# Patient Record
Sex: Female | Born: 1985
Health system: Southern US, Community
[De-identification: ages and names within clinical notes are randomized; demographics above are authoritative.]

## PROBLEM LIST (undated history)

## (undated) DIAGNOSIS — R638 Other symptoms and signs concerning food and fluid intake: Secondary | ICD-10-CM

## (undated) DIAGNOSIS — T8859XA Other complications of anesthesia, initial encounter: Secondary | ICD-10-CM

## (undated) DIAGNOSIS — N920 Excessive and frequent menstruation with regular cycle: Secondary | ICD-10-CM

## (undated) DIAGNOSIS — R32 Unspecified urinary incontinence: Secondary | ICD-10-CM

## (undated) DIAGNOSIS — F419 Anxiety disorder, unspecified: Secondary | ICD-10-CM

## (undated) DIAGNOSIS — Z720 Tobacco use: Secondary | ICD-10-CM

## (undated) DIAGNOSIS — F319 Bipolar disorder, unspecified: Secondary | ICD-10-CM

## (undated) DIAGNOSIS — T4145XA Adverse effect of unspecified anesthetic, initial encounter: Secondary | ICD-10-CM

## (undated) DIAGNOSIS — F32A Depression, unspecified: Secondary | ICD-10-CM

## (undated) DIAGNOSIS — F329 Major depressive disorder, single episode, unspecified: Secondary | ICD-10-CM

## (undated) DIAGNOSIS — F4310 Post-traumatic stress disorder, unspecified: Secondary | ICD-10-CM

## (undated) DIAGNOSIS — K30 Functional dyspepsia: Secondary | ICD-10-CM

## (undated) HISTORY — DX: Anxiety disorder, unspecified: F41.9

## (undated) HISTORY — DX: Tobacco use: Z72.0

## (undated) HISTORY — DX: Other symptoms and signs concerning food and fluid intake: R63.8

## (undated) HISTORY — DX: Post-traumatic stress disorder, unspecified: F43.10

## (undated) HISTORY — DX: Excessive and frequent menstruation with regular cycle: N92.0

## (undated) HISTORY — DX: Bipolar disorder, unspecified: F31.9

## (undated) HISTORY — DX: Depression, unspecified: F32.A

## (undated) HISTORY — DX: Major depressive disorder, single episode, unspecified: F32.9

---

## 2008-09-27 ENCOUNTER — Emergency Department: Payer: Self-pay | Admitting: Emergency Medicine

## 2008-11-21 ENCOUNTER — Observation Stay: Payer: Self-pay | Admitting: Obstetrics and Gynecology

## 2008-11-23 ENCOUNTER — Observation Stay: Payer: Self-pay | Admitting: Obstetrics and Gynecology

## 2008-12-13 ENCOUNTER — Observation Stay: Payer: Self-pay | Admitting: Obstetrics and Gynecology

## 2008-12-14 ENCOUNTER — Ambulatory Visit: Payer: Self-pay | Admitting: Obstetrics and Gynecology

## 2008-12-15 ENCOUNTER — Observation Stay: Payer: Self-pay | Admitting: Obstetrics and Gynecology

## 2009-01-10 ENCOUNTER — Observation Stay: Payer: Self-pay | Admitting: Obstetrics and Gynecology

## 2009-01-18 ENCOUNTER — Observation Stay: Payer: Self-pay | Admitting: Obstetrics and Gynecology

## 2009-01-21 ENCOUNTER — Observation Stay: Payer: Self-pay | Admitting: Obstetrics and Gynecology

## 2009-01-25 ENCOUNTER — Inpatient Hospital Stay: Payer: Self-pay | Admitting: Obstetrics and Gynecology

## 2009-06-29 HISTORY — PX: TUBAL LIGATION: SHX77

## 2010-08-27 ENCOUNTER — Ambulatory Visit: Payer: Self-pay | Admitting: Obstetrics and Gynecology

## 2010-08-29 ENCOUNTER — Ambulatory Visit: Payer: Self-pay | Admitting: Obstetrics and Gynecology

## 2011-07-28 ENCOUNTER — Emergency Department: Payer: Self-pay | Admitting: Emergency Medicine

## 2011-10-27 ENCOUNTER — Emergency Department: Payer: Self-pay | Admitting: *Deleted

## 2011-10-27 LAB — PREGNANCY, URINE: Pregnancy Test, Urine: NEGATIVE m[IU]/mL

## 2011-10-27 LAB — URINALYSIS, COMPLETE
Bacteria: NONE SEEN
Bilirubin,UR: NEGATIVE
Glucose,UR: NEGATIVE mg/dL (ref 0–75)
Ketone: NEGATIVE
Leukocyte Esterase: NEGATIVE
Ph: 6 (ref 4.5–8.0)
Protein: NEGATIVE
Specific Gravity: 1.028 (ref 1.003–1.030)
Squamous Epithelial: 8

## 2012-03-22 ENCOUNTER — Inpatient Hospital Stay: Payer: Self-pay | Admitting: Psychiatry

## 2012-03-23 LAB — BEHAVIORAL MEDICINE 1 PANEL
Alkaline Phosphatase: 68 U/L (ref 50–136)
BUN: 11 mg/dL (ref 7–18)
Basophil #: 0 10*3/uL (ref 0.0–0.1)
Basophil %: 0.6 %
Bilirubin,Total: 0.6 mg/dL (ref 0.2–1.0)
Chloride: 105 mmol/L (ref 98–107)
Co2: 26 mmol/L (ref 21–32)
Creatinine: 0.74 mg/dL (ref 0.60–1.30)
EGFR (African American): >60
EGFR (Non-African Amer.): >60
MCH: 31 pg (ref 26.0–34.0)
MCHC: 34.1 g/dL (ref 32.0–36.0)
Neutrophil #: 1.7 10*3/uL (ref 1.4–6.5)
Neutrophil %: 38.8 %
Osmolality: 276 (ref 275–301)
Platelet: 227 10*3/uL (ref 150–440)
SGPT (ALT): 22 U/L (ref 12–78)
Sodium: 139 mmol/L (ref 136–145)
Total Protein: 7.1 g/dL (ref 6.4–8.2)
WBC: 4.3 10*3/uL (ref 3.6–11.0)

## 2012-03-23 LAB — URINALYSIS, COMPLETE
Bilirubin,UR: NEGATIVE
Glucose,UR: NEGATIVE mg/dL (ref 0–75)
Nitrite: NEGATIVE
RBC,UR: 326 /HPF (ref 0–5)
Squamous Epithelial: 5
WBC UR: 12 /HPF (ref 0–5)

## 2012-06-16 ENCOUNTER — Emergency Department: Payer: Self-pay | Admitting: Internal Medicine

## 2013-04-24 ENCOUNTER — Emergency Department: Payer: Self-pay

## 2013-04-24 LAB — GC/CHLAMYDIA PROBE AMP

## 2013-04-24 LAB — COMPREHENSIVE METABOLIC PANEL
Albumin: 3.7 g/dL (ref 3.4–5.0)
Alkaline Phosphatase: 96 U/L (ref 50–136)
Anion Gap: 5 — ABNORMAL LOW (ref 7–16)
Chloride: 106 mmol/L (ref 98–107)
Co2: 27 mmol/L (ref 21–32)
EGFR (Non-African Amer.): >60
Glucose: 94 mg/dL (ref 65–99)
Osmolality: 273 (ref 275–301)
SGOT(AST): 26 U/L (ref 15–37)
SGPT (ALT): 27 U/L (ref 12–78)
Total Protein: 7.6 g/dL (ref 6.4–8.2)

## 2013-04-24 LAB — URINALYSIS, COMPLETE
Bacteria: NONE SEEN
Glucose,UR: NEGATIVE mg/dL (ref 0–75)
Ketone: NEGATIVE
Nitrite: NEGATIVE
Ph: 8 (ref 4.5–8.0)
Protein: NEGATIVE
RBC,UR: 18 /HPF (ref 0–5)

## 2013-04-24 LAB — CBC
HCT: 40.8 % (ref 35.0–47.0)
MCH: 31 pg (ref 26.0–34.0)
MCHC: 34.5 g/dL (ref 32.0–36.0)
RBC: 4.55 10*6/uL (ref 3.80–5.20)
RDW: 13.6 % (ref 11.5–14.5)

## 2013-04-24 LAB — WET PREP, GENITAL

## 2013-04-24 LAB — LIPASE, BLOOD: Lipase: 114 U/L (ref 73–393)

## 2014-06-05 LAB — HM PAP SMEAR: HM Pap smear: NEGATIVE

## 2014-10-16 NOTE — H&P (Signed)
PATIENT NAME:  Laura [name], Laura [name] MR#:  [id number] DATE OF BIRTH:  [birth year]  DATE OF ADMISSION:  03/22/2012  REFERRING PHYSICIAN: Marlowe Alt, MD ATTENDING PHYSICIAN: Orson Slick, M.D.   IDENTIFYING DATA: Laura [name] is a 29 year old female with history of depression and anxiety.   CHIEF COMPLAINT: "I have to go home."   HISTORY OF PRESENT ILLNESS: Laura [name] has a history of depression and anxiety. In the past she did take SSRIs for it but never liked the effect and did not stay on them long enough.  She was able to deal with depression and anxiety up until recently when financial troubles for her family made her very, very anxious. She is the sole provider for here family of 3 children and a husband who is disabled. She has been receiving food stamps for the family, $700 a month.  She lost food stamps recently.  She is hardly able to pay her bills, is worried about losing her house, and there is no food on the table.  The patient became increasingly anxious with multiple panic attacks. She went to her primary provider who gave her Xanax and Prozac and earlier Celexa but she was unable to tolerate medications. Moreover, Celexa made her suicidal. She is incapacitated by anxiety. Frequently she has to leave her office and go to her car to conceal her panic attacks. She has not been able to sleep. Her thoughts are racing. She has no appetite and lost some weight. She feels guilty, worthless, hopeless. She cries all the time. She has poor energy and concentration. There are problems with memory. There is social isolation, anhedonia, lack of interest. The patient denies thoughts of hurting herself or others. She denies alcohol or illicit drug use. Denies prescription pill abuse. She denies psychotic symptoms or symptoms suggestive of bipolar mania.   PAST PSYCHIATRIC HISTORY: As above, on several occasions she was started on an SSRI but never liked it and never felt it was working for her.  Sometimes she felt it was making her feel worse, strange; and Celexa gave her suicidal ideations. She denies any suicide attempts. No psychiatric hospitalizations.   FAMILY PSYCHIATRIC HISTORY: Mother with anxiety.   PAST MEDICAL HISTORY: None.   ALLERGIES: Amoxicillin.   MEDICATIONS ON ADMISSION: None.   SOCIAL HISTORY: She is married. Her husband suffers Crohn disease and is in the process of getting his disability. She has 3 small children, all of them at the age of 67 currently.  There are twins and another boy. 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 She has been there for 3 months and just past the 96-month threshold. She is very afraid that she will lose her job if she is continues to miss work either due to panic attacks or hospitalization. Dr. Starr Sinclair office did call her employer to let them know that she is in the hospital.    REVIEW OF SYSTEMS: CONSTITUTIONAL: No fevers or chills. No weight changes. EYES: No double or blurred vision. ENT: No hearing loss. RESPIRATORY: No shortness of breath or cough. CARDIOVASCULAR: No chest pain or orthopnea. GASTROINTESTINAL: No abdominal pain, nausea, vomiting, or diarrhea. GU: No incontinence or frequency. ENDOCRINE: No heat or cold intolerance. LYMPHATIC: No anemia or easy bruising. INTEGUMENTARY: No acne or rash. MUSCULOSKELETAL: No muscle or joint pain. NEUROLOGIC: No tingling or weakness. PSYCHIATRIC: See History of Present Illness for details.   PHYSICAL EXAMINATION:  VITAL SIGNS: Blood pressure 114/76, pulse 80, respirations 18, temperature 98.9.  GENERAL: This is a well-developed female in no acute distress.   HEENT: The pupils are equal, round, and reactive to light. Sclerae anicteric.   NECK: Supple. No thyromegaly.   LUNGS: Clear to auscultation. No dullness to percussion.   HEART: Regular rhythm and rate. No murmurs, rubs, or gallops.   ABDOMEN: Soft, nontender, nondistended. Positive bowel sounds.    MUSCULOSKELETAL: Normal muscle strength in all extremities.   SKIN: No rashes or bruises.   LYMPHATIC: No cervical adenopathy.   NEUROLOGIC: Cranial nerves II through XII are intact.   LABORATORY DATA: Chemistries are within normal limits. LFTs within normal limits. TSH 1.25. CBC within normal limits. Urinalysis is not suggestive of urinary tract infection.   MENTAL STATUS EXAMINATION ON ADMISSION: The patient is alert and oriented to person, place, time, and situation. She is pleasant, polite, and cooperative. She is well groomed and casually dressed. She is tearful through most part of the interview. She maintains adequate eye contact. Her speech is soft. Mood is depressed with anxious and tearful affect. Thought processing is logical and goal oriented. Thought content: She denies suicidal or homicidal ideation. There are no delusions or paranoia. There are no auditory or visual hallucinations. Her cognition is grossly intact. Her insight and judgment are fair.   SUICIDE RISK ASSESSMENT ON ADMISSION: This is a patient with a history of depression and anxiety who became more anxious with frequent panic attacks in the context of recent economic problems.   DIAGNOSES:  AXIS I: Major depressive disorder, recurrent, severe. Panic disorder with agoraphobia.  AXIS II: Deferred.  AXIS III: None.  AXIS IV: Mental illness, financial, occupational.  AXIS V: Global assessment of function on admission 35.   PLAN: The patient was admitted to [facility] for safety, stabilization, and medication management. She was initially placed on suicide precautions and was closely monitored for any unsafe behaviors. She underwent full psychiatric and risk assessment. She received pharmacotherapy, individual and group psychotherapy, substance abuse counseling, and support from therapeutic milieu.           1. Mood and anxiety. The patient did not tolerate SSRIs  well in the past. We will start Remeron at night for mood, anxiety, sleep, and appetite. The patient will have available trazodone if need be. 2. Disposition: She will be discharged to home with her husband hopefully shortly.    ____________________________ Wardell Honour. Bary Leriche, MD jbp:vtd D: 03/23/2012 13:20:47 ET T: 03/23/2012 13:40:44 ET JOB#: [id number]  cc: Jolette Lana B. Bary Leriche, MD, <Dictator> Clovis Fredrickson MD ELECTRONICALLY SIGNED 03/25/2012 1:57

## 2015-03-13 ENCOUNTER — Ambulatory Visit (INDEPENDENT_AMBULATORY_CARE_PROVIDER_SITE_OTHER): Payer: 59 | Admitting: Physician Assistant

## 2015-03-13 ENCOUNTER — Encounter: Payer: Self-pay | Admitting: Physician Assistant

## 2015-03-13 ENCOUNTER — Other Ambulatory Visit: Payer: Self-pay

## 2015-03-13 VITALS — BP 116/70 | HR 80 | Temp 98.3°F | Resp 16 | Wt 203.0 lb

## 2015-03-13 DIAGNOSIS — F419 Anxiety disorder, unspecified: Secondary | ICD-10-CM | POA: Insufficient documentation

## 2015-03-13 DIAGNOSIS — E162 Hypoglycemia, unspecified: Secondary | ICD-10-CM | POA: Diagnosis not present

## 2015-03-13 DIAGNOSIS — F329 Major depressive disorder, single episode, unspecified: Secondary | ICD-10-CM | POA: Diagnosis not present

## 2015-03-13 DIAGNOSIS — F4310 Post-traumatic stress disorder, unspecified: Secondary | ICD-10-CM | POA: Insufficient documentation

## 2015-03-13 DIAGNOSIS — F32A Depression, unspecified: Secondary | ICD-10-CM | POA: Insufficient documentation

## 2015-03-13 MED ORDER — ALPRAZOLAM 1 MG PO TABS: 1.0000 mg | ORAL_TABLET | Freq: Two times a day (BID) | ORAL | Status: DC | PRN

## 2015-03-13 MED ORDER — BUPROPION HCL ER (XL) 300 MG PO TB24: 300.0000 mg | ORAL_TABLET | Freq: Every day | ORAL | Status: DC

## 2015-03-13 NOTE — Progress Notes (Signed)
Patient: Laura [name] Female    DOB: December 20, 1985   29 y.o.   MRN: [id number] Visit Date: 03/13/2015  Today's Provider: Mar Daring, PA-C   Chief Complaint  Patient presents with  . Blood sugar drooping   Subjective:    Diabetes She presents for her initial diabetic visit. She has type 2 diabetes mellitus. No MedicAlert identification noted. Her disease course has been stable. Hypoglycemia symptoms include dizziness, headaches, mood changes, nervousness/anxiousness and sweats. Associated symptoms include chest pain and fatigue. Symptoms are improving. Risk factors for coronary artery disease include family history, obesity and tobacco exposure. Her weight is stable (Lost 14 lbs since May). When asked about meal planning, she reported none. She has not had a previous visit with a dietitian. She never participates in exercise. Eye exam is not current.  Anxiety Presents for initial visit. Onset was more than 5 years ago. The problem has been gradually worsening. Symptoms include chest pain, decreased concentration, depressed mood, dizziness, excessive worry, hyperventilation, insomnia, irritability, nervous/anxious behavior and panic. Patient reports no suicidal ideas. Symptoms occur most days. The most recent episode lasted 2 hours. The severity of symptoms is moderate (mild to moderate). The symptoms are aggravated by family issues. Hours of sleep per night: 3-4. The quality of sleep is poor.         No Known Allergies Previous Medications   BUPROPION (WELLBUTRIN XL) 150 MG 24 HR TABLET       LEVONORGESTREL-ETHINYL ESTRADIOL (JOLESSA) 0.15-0.03 MG TABLET    Take 1 tablet by mouth daily.    Review of Systems  Constitutional: Positive for irritability and fatigue.  Eyes: Negative.   Respiratory: Negative.   Cardiovascular: Positive for chest pain.  Gastrointestinal: Negative.   Endocrine: Negative.   Genitourinary: Negative.   Musculoskeletal: Negative.   Skin:  Negative.   Allergic/Immunologic: Negative.   Neurological: Positive for dizziness, light-headedness and headaches.  Hematological: Negative.   Psychiatric/Behavioral: Positive for decreased concentration and agitation. Negative for suicidal ideas. The patient is nervous/anxious and has insomnia.     Social History  Substance Use Topics  . Smoking status: Current Every Day Smoker -- 0.50 packs/day  . Smokeless tobacco: Never Used  . Alcohol Use: Yes     Comment: occasionally; beer   Objective:   BP 116/70 mmHg  Pulse 80  Temp(Src) 98.3 F (36.8 C) (Oral)  Resp 16  Wt 203 lb (92.08 kg)  LMP   Physical Exam  Constitutional: She appears well-developed and well-nourished. No distress.  HENT:  Head: Normocephalic and atraumatic.  Right Ear: Hearing, external ear and ear canal normal. A middle ear effusion is present.  Left Ear: Hearing, external ear and ear canal normal. A middle ear effusion is present.  Nose: Nose normal.  Mouth/Throat: Uvula is midline, oropharynx is clear and moist and mucous membranes are normal. No oropharyngeal exudate.  Eyes: Conjunctivae and EOM are normal. Pupils are equal, round, and reactive to light. Right eye exhibits no discharge. Left eye exhibits no discharge. No scleral icterus.  Neck: Normal range of motion. Neck supple. No JVD present. No tracheal deviation present. No thyromegaly present.  Cardiovascular: Normal rate, regular rhythm and normal heart sounds.  Exam reveals no gallop and no friction rub.   No murmur heard. Pulmonary/Chest: Effort normal and breath sounds normal. No respiratory distress. She has no wheezes. She has no rales.  Lymphadenopathy:    She has no cervical adenopathy.  Skin: She is not diaphoretic.  Psychiatric: Her speech is normal and behavior is normal. Judgment and thought content normal. Cognition and memory are normal. She exhibits a depressed mood.  Vitals reviewed.       Assessment & Plan:     1.  Hypoglycemia I will workup different causes of hypoglycemia. We did discuss possible reactive hypoglycemia. She has had an elevated hemoglobin A1c in the past with the result 5.6. Most recent hemoglobin A1c was 5.3. She states that recently the highest her blood sugar has ran was 74. This was postprandial. She does feel that the hypoglycemic episodes are improving some. They are not occurring as frequently. Last week however she did have an episode almost daily. She is also questioning whether this may be secondary to her anxiety and depression. I did discuss that this is a possibility but it would be better to go ahead and rule out any other possible causes for the hypoglycemia so that they may be treated to prevent any worsening symptoms. I also advised her to try to eat frequent small meals daily and to keep a snack with her in case she has the symptoms of her blood sugar dropping. I will check labs as below for a secondary cause for the hypoglycemia. I will follow-up with her in 4-8 weeks to see how the hypoglycemic episodes are doing as well as to check her anxiety and depression. - TSH - CBC with Differential - Comprehensive Metabolic Panel (CMET) - Cortisol - Cancer Antigen 19-9 - Growth hormone  2. Anxiety She has been under a lot of stress recently with family stress and work stress. She has been trying to help take care of her husband and her grandmother who is in failing health. She is the primary caregiver for her grandmother now. She also has 3 young children that she has to care for. She has had a history of anxiety and depression where she required the use of antidepressants and anxiolytic. She states that this combination did help her and she was able to come off of the medications for short period. I will prescribe Xanax as below and will follow-up with her in 4-8 weeks to see how she is doing. I did advise her to take one Xanax at night prior to bed for her insomnia. Insomnia is caused by  racing thoughts stating she feels like she can't shut her brain down. I also discussed with her different sleep hygiene techniques and sleep meditation techniques. We will recheck in 4-8 weeks to see how she is doing. - ALPRAZolam (XANAX) 1 MG tablet; Take 1 tablet (1 mg total) by mouth 2 (two) times daily as needed for anxiety.  Dispense: 60 tablet; Refill: 1 - TSH  3. Depression See above medical treatment plan for anxiety. Will increase bupropion to 300 mg daily to see if this gives her better control of her symptoms. I will follow-up with her in 4-8 weeks to see how the increase in therapy is doing. - buPROPion (WELLBUTRIN XL) 300 MG 24 hr tablet; Take 1 tablet (300 mg total) by mouth daily.  Dispense: 30 tablet; Refill: [place], PA-C  [facility]

## 2015-03-13 NOTE — Patient Instructions (Signed)
Depression Depression refers to feeling sad, low, down in the dumps, blue, gloomy, or empty. In general, there are two kinds of depression: 1. Normal sadness or normal grief. This kind of depression is one that we all feel from time to time after upsetting life experiences, such as the loss of a job or the ending of a relationship. This kind of depression is considered normal, is short lived, and resolves within a few days to 2 weeks. Depression experienced after the loss of a loved one (bereavement) often lasts longer than 2 weeks but normally gets better with time. 2. Clinical depression. This kind of depression lasts longer than normal sadness or normal grief or interferes with your ability to function at home, at work, and in school. It also interferes with your personal relationships. It affects almost every aspect of your life. Clinical depression is an illness. Symptoms of depression can also be caused by conditions other than those mentioned above, such as:  Physical illness. Some physical illnesses, including underactive thyroid gland (hypothyroidism), severe anemia, specific types of cancer, diabetes, uncontrolled seizures, heart and lung problems, strokes, and chronic pain are commonly associated with symptoms of depression.  Side effects of some prescription medicine. In some people, certain types of medicine can cause symptoms of depression.  Substance abuse. Abuse of alcohol and illicit drugs can cause symptoms of depression. SYMPTOMS Symptoms of normal sadness and normal grief include the following:  Feeling sad or crying for short periods of time.  Not caring about anything (apathy).  Difficulty sleeping or sleeping too much.  No longer able to enjoy the things you used to enjoy.  Desire to be by oneself all the time (social isolation).  Lack of energy or motivation.  Difficulty concentrating or remembering.  Change in appetite or weight.  Restlessness or  agitation. Symptoms of clinical depression include the same symptoms of normal sadness or normal grief and also the following symptoms:  Feeling sad or crying all the time.  Feelings of guilt or worthlessness.  Feelings of hopelessness or helplessness.  Thoughts of suicide or the desire to harm yourself (suicidal ideation).  Loss of touch with reality (psychotic symptoms). Seeing or hearing things that are not real (hallucinations) or having false beliefs about your life or the people around you (delusions and paranoia). DIAGNOSIS  The diagnosis of clinical depression is usually based on how bad the symptoms are and how long they have lasted. Your health care provider will also ask you questions about your medical history and substance use to find out if physical illness, use of prescription medicine, or substance abuse is causing your depression. Your health care provider may also order blood tests. TREATMENT  Often, normal sadness and normal grief do not require treatment. However, sometimes antidepressant medicine is given for bereavement to ease the depressive symptoms until they resolve. The treatment for clinical depression depends on how bad the symptoms are but often includes antidepressant medicine, counseling with a mental health professional, or both. Your health care provider will help to determine what treatment is best for you. Depression caused by physical illness usually goes away with appropriate medical treatment of the illness. If prescription medicine is causing depression, talk with your health care provider about stopping the medicine, decreasing the dose, or changing to another medicine. Depression caused by the abuse of alcohol or illicit drugs goes away when you stop using these substances. Some adults need professional help in order to stop drinking or using drugs. SEEK IMMEDIATE MEDICAL   CARE IF:  You have thoughts about hurting yourself or others.  You lose touch  with reality (have psychotic symptoms).  You are taking medicine for depression and have a serious side effect. FOR MORE INFORMATION  National Alliance on Mental Illness: www.nami.org  National Institute of Mental Health: www.nimh.nih.gov Document Released: 06/12/2000 Document Revised: 10/30/2013 Document Reviewed: 09/14/2011 ExitCare Patient Information 2015 [employer]. This information is not intended to replace advice given to you by your health care provider. Make sure you discuss any questions you have with your health care provider. Generalized Anxiety Disorder Generalized anxiety disorder (GAD) is a mental disorder. It interferes with life functions, including relationships, work, and school. GAD is different from normal anxiety, which everyone experiences at some point in their lives in response to specific life events and activities. Normal anxiety actually helps us prepare for and get through these life events and activities. Normal anxiety goes away after the event or activity is over.  GAD causes anxiety that is not necessarily related to specific events or activities. It also causes excess anxiety in proportion to specific events or activities. The anxiety associated with GAD is also difficult to control. GAD can vary from mild to severe. People with severe GAD can have intense waves of anxiety with physical symptoms (panic attacks).  SYMPTOMS The anxiety and worry associated with GAD are difficult to control. This anxiety and worry are related to many life events and activities and also occur more days than not for 6 months or longer. People with GAD also have three or more of the following symptoms (one or more in children): 3. Restlessness.  4. Fatigue. 5. Difficulty concentrating.  6. Irritability. 7. Muscle tension. 8. Difficulty sleeping or unsatisfying sleep. DIAGNOSIS GAD is diagnosed through an assessment by your health care provider. Your health care provider  will ask you questions aboutyour mood,physical symptoms, and events in your life. Your health care provider may ask you about your medical history and use of alcohol or drugs, including prescription medicines. Your health care provider may also do a physical exam and blood tests. Certain medical conditions and the use of certain substances can cause symptoms similar to those associated with GAD. Your health care provider may refer you to a mental health specialist for further evaluation. TREATMENT The following therapies are usually used to treat GAD:   Medication. Antidepressant medication usually is prescribed for long-term daily control. Antianxiety medicines may be added in severe cases, especially when panic attacks occur.   Talk therapy (psychotherapy). Certain types of talk therapy can be helpful in treating GAD by providing support, education, and guidance. A form of talk therapy called cognitive behavioral therapy can teach you healthy ways to think about and react to daily life events and activities.  Stress managementtechniques. These include yoga, meditation, and exercise and can be very helpful when they are practiced regularly. A mental health specialist can help determine which treatment is best for you. Some people see improvement with one therapy. However, other people require a combination of therapies. Document Released: 10/10/2012 Document Revised: 10/30/2013 Document Reviewed: 10/10/2012 ExitCare Patient Information 2015 [employer]. This information is not intended to replace advice given to you by your health care provider. Make sure you discuss any questions you have with your health care provider.  

## 2015-03-20 LAB — CBC WITH DIFFERENTIAL/PLATELET
BASOS: 0 %
Basophils Absolute: 0 10*3/uL (ref 0.0–0.2)
EOS (ABSOLUTE): 0.1 10*3/uL (ref 0.0–0.4)
EOS: 2 %
HEMATOCRIT: 39.9 % (ref 34.0–46.6)
Hemoglobin: 13.8 g/dL (ref 11.1–15.9)
Immature Grans (Abs): 0 10*3/uL (ref 0.0–0.1)
Immature Granulocytes: 0 %
LYMPHS ABS: 2.3 10*3/uL (ref 0.7–3.1)
Lymphs: 36 %
MCH: 30.7 pg (ref 26.6–33.0)
MCHC: 34.6 g/dL (ref 31.5–35.7)
MCV: 89 fL (ref 79–97)
MONOS ABS: 0.4 10*3/uL (ref 0.1–0.9)
Monocytes: 6 %
NEUTROS ABS: 3.6 10*3/uL (ref 1.4–7.0)
Neutrophils: 56 %
Platelets: 352 10*3/uL (ref 150–379)
RBC: 4.49 x10E6/uL (ref 3.77–5.28)
RDW: 14.1 % (ref 12.3–15.4)
WBC: 6.4 10*3/uL (ref 3.4–10.8)

## 2015-03-20 LAB — COMPREHENSIVE METABOLIC PANEL
A/G RATIO: 1.5 (ref 1.1–2.5)
ALBUMIN: 3.7 g/dL (ref 3.5–5.5)
ALK PHOS: 64 IU/L (ref 39–117)
ALT: 11 IU/L (ref 0–32)
AST: 10 IU/L (ref 0–40)
BUN / CREAT RATIO: 8 (ref 8–20)
BUN: 7 mg/dL (ref 6–20)
Bilirubin Total: 0.2 mg/dL (ref 0.0–1.2)
CO2: 20 mmol/L (ref 18–29)
Calcium: 9.2 mg/dL (ref 8.7–10.2)
Chloride: 103 mmol/L (ref 97–108)
Creatinine, Ser: 0.89 mg/dL (ref 0.57–1.00)
GFR calc Af Amer: 102 mL/min/{1.73_m2} (ref >59)
GFR calc non Af Amer: 88 mL/min/{1.73_m2} (ref >59)
GLOBULIN, TOTAL: 2.5 g/dL (ref 1.5–4.5)
Glucose: 87 mg/dL (ref 65–99)
POTASSIUM: 4.6 mmol/L (ref 3.5–5.2)
SODIUM: 140 mmol/L (ref 134–144)
Total Protein: 6.2 g/dL (ref 6.0–8.5)

## 2015-03-20 LAB — TSH: TSH: 1.89 u[IU]/mL (ref 0.450–4.500)

## 2015-03-20 LAB — GROWTH HORMONE: Growth Hormone: 0.4 ng/mL (ref 0.0–10.0)

## 2015-03-20 LAB — CANCER ANTIGEN 19-9: CA 19 9: 7 U/mL (ref 0–35)

## 2015-03-20 LAB — CORTISOL: CORTISOL: 20.4 ug/dL

## 2015-04-09 ENCOUNTER — Ambulatory Visit: Payer: Self-pay | Admitting: Family

## 2015-04-09 ENCOUNTER — Encounter: Payer: Self-pay | Admitting: Physician Assistant

## 2015-04-09 VITALS — BP 128/88 | Temp 98.4°F | Wt 205.0 lb

## 2015-04-09 DIAGNOSIS — J45909 Unspecified asthma, uncomplicated: Secondary | ICD-10-CM

## 2015-04-09 DIAGNOSIS — J069 Acute upper respiratory infection, unspecified: Secondary | ICD-10-CM

## 2015-04-09 MED ORDER — PREDNISONE 10 MG (21) PO TBPK: 10.0000 mg | ORAL_TABLET | Freq: Every day | ORAL | Status: DC

## 2015-04-09 MED ORDER — ALBUTEROL SULFATE HFA 108 (90 BASE) MCG/ACT IN AERS: 2.0000 | INHALATION_SPRAY | Freq: Four times a day (QID) | RESPIRATORY_TRACT | Status: DC | PRN

## 2015-04-09 MED ORDER — HYDROCOD POLST-CPM POLST ER 10-8 MG/5ML PO SUER: 5.0000 mL | Freq: Two times a day (BID) | ORAL | Status: DC | PRN

## 2015-04-09 MED ORDER — AZITHROMYCIN 250 MG PO TABS
ORAL_TABLET | ORAL | Status: DC
Start: 2015-04-09 — End: 2015-04-19

## 2015-04-09 MED ORDER — IPRATROPIUM-ALBUTEROL 0.5-2.5 (3) MG/3ML IN SOLN
3.0000 mL | Freq: Once | RESPIRATORY_TRACT | Status: AC
  Administered 2015-04-09: 3 mL via RESPIRATORY_TRACT

## 2015-04-09 NOTE — Progress Notes (Signed)
S/ cold and cough x 2 weeks, now with tightness , can not rest due to cough, low grade fever, chills, bringing up yellow  Sputum , smoker  O/ VSS , alert coughing often , looks fatigued NAD  ENT  tms retracted , nasal mucosa erythematous  Boggy Neg facial tenderness  Pharynx clear Neck supple without nodes Heart RSR Lungs clear   A/ URI with  RAD P / Neb tx with improvement in sxs . Home for the day. rx zpack, pred pack ,albuterol and tussionex rx written.Marland Kitchen Hydration encouraged as well as smoking cessationl.f/u prn not improving.

## 2015-04-09 NOTE — Patient Instructions (Signed)
How to Use an Inhaler Proper inhaler technique is very important. Good technique ensures that the medicine reaches the lungs. Poor technique results in depositing the medicine on the tongue and back of the throat rather than in the airways. If you do not use the inhaler with good technique, the medicine will not help you. STEPS TO FOLLOW IF USING AN INHALER WITHOUT AN EXTENSION TUBE  Remove the cap from the inhaler.  If you are using the inhaler for the first time, you will need to prime it. Shake the inhaler for 5 seconds and release four puffs into the air, away from your face. Ask your health care provider or pharmacist if you have questions about priming your inhaler.  Shake the inhaler for 5 seconds before each breath in (inhalation).  Position the inhaler so that the top of the canister faces up.  Put your index finger on the top of the medicine canister. Your thumb supports the bottom of the inhaler.  Open your mouth.  Either place the inhaler between your teeth and place your lips tightly around the mouthpiece, or hold the inhaler 1-2 inches away from your open mouth. If you are unsure of which technique to use, ask your health care provider.  Breathe out (exhale) normally and as completely as possible.  Press the canister down with your index finger to release the medicine.  At the same time as the canister is pressed, inhale deeply and slowly until your lungs are completely filled. This should take 4-6 seconds. Keep your tongue down.  Hold the medicine in your lungs for 5-10 seconds (10 seconds is best). This helps the medicine get into the small airways of your lungs.  Breathe out slowly, through pursed lips. Whistling is an example of pursed lips.  Wait at least 15-30 seconds between puffs. Continue with the above steps until you have taken the number of puffs your health care provider has ordered. Do not use the inhaler more than your health care provider tells  you.  Replace the cap on the inhaler.  Follow the directions from your health care provider or the inhaler insert for cleaning the inhaler. STEPS TO FOLLOW IF USING AN INHALER WITH AN EXTENSION (SPACER)  Remove the cap from the inhaler.  If you are using the inhaler for the first time, you will need to prime it. Shake the inhaler for 5 seconds and release four puffs into the air, away from your face. Ask your health care provider or pharmacist if you have questions about priming your inhaler.  Shake the inhaler for 5 seconds before each breath in (inhalation).  Place the open end of the spacer onto the mouthpiece of the inhaler.  Position the inhaler so that the top of the canister faces up and the spacer mouthpiece faces you.  Put your index finger on the top of the medicine canister. Your thumb supports the bottom of the inhaler and the spacer.  Breathe out (exhale) normally and as completely as possible.  Immediately after exhaling, place the spacer between your teeth and into your mouth. Close your lips tightly around the spacer.  Press the canister down with your index finger to release the medicine.  At the same time as the canister is pressed, inhale deeply and slowly until your lungs are completely filled. This should take 4-6 seconds. Keep your tongue down and out of the way.  Hold the medicine in your lungs for 5-10 seconds (10 seconds is best). This helps the  medicine get into the small airways of your lungs. Exhale.  Repeat inhaling deeply through the spacer mouthpiece. Again hold that breath for up to 10 seconds (10 seconds is best). Exhale slowly. If it is difficult to take this second deep breath through the spacer, breathe normally several times through the spacer. Remove the spacer from your mouth.  Wait at least 15-30 seconds between puffs. Continue with the above steps until you have taken the number of puffs your health care provider has ordered. Do not use the  inhaler more than your health care provider tells you.  Remove the spacer from the inhaler, and place the cap on the inhaler.  Follow the directions from your health care provider or the inhaler insert for cleaning the inhaler and spacer. If you are using different kinds of inhalers, use your quick relief medicine to open the airways 10-15 minutes before using a steroid if instructed to do so by your health care provider. If you are unsure which inhalers to use and the order of using them, ask your health care provider, nurse, or respiratory therapist. If you are using a steroid inhaler, always rinse your mouth with water after your last puff, then gargle and spit out the water. Do not swallow the water. AVOID:  Inhaling before or after starting the spray of medicine. It takes practice to coordinate your breathing with triggering the spray.  Inhaling through the nose (rather than the mouth) when triggering the spray. HOW TO DETERMINE IF YOUR INHALER IS FULL OR NEARLY EMPTY You cannot know when an inhaler is empty by shaking it. A few inhalers are now being made with dose counters. Ask your health care provider for a prescription that has a dose counter if you feel you need that extra help. If your inhaler does not have a counter, ask your health care provider to help you determine the date you need to refill your inhaler. Write the refill date on a calendar or your inhaler canister. Refill your inhaler 7-10 days before it runs out. Be sure to keep an adequate supply of medicine. This includes making sure it is not expired, and that you have a spare inhaler.  SEEK MEDICAL CARE IF:   Your symptoms are only partially relieved with your inhaler.  You are having trouble using your inhaler.  You have some increase in phlegm. SEEK IMMEDIATE MEDICAL CARE IF:   You feel little or no relief with your inhalers. You are still wheezing and are feeling shortness of breath or tightness in your chest or  both.  You have dizziness, headaches, or a fast heart rate.  You have chills, fever, or night sweats.  You have a noticeable increase in phlegm production, or there is blood in the phlegm. MAKE SURE YOU:   Understand these instructions.  Will watch your condition.  Will get help right away if you are not doing well or get worse.   This information is not intended to replace advice given to you by your health care provider. Make sure you discuss any questions you have with your health care provider.   Document Released: 06/12/2000 Document Revised: 04/05/2013 Document Reviewed: 01/12/2013 Elsevier Interactive Patient Education 2016 [employer] Cough, Adult Coughing is a reflex that clears your throat and your airways. Coughing helps to heal and protect your lungs. It is normal to cough occasionally, but a cough that happens with other symptoms or lasts a long time may be a sign of a condition that needs  treatment. A cough may last only 2-3 weeks (acute), or it may last longer than 8 weeks (chronic). CAUSES Coughing is commonly caused by:  Breathing in substances that irritate your lungs.  A viral or bacterial respiratory infection.  Allergies.  Asthma.  Postnasal drip.  Smoking.  Acid backing up from the stomach into the esophagus (gastroesophageal reflux).  Certain medicines.  Chronic lung problems, including COPD (or rarely, lung cancer).  Other medical conditions such as heart failure. HOME CARE INSTRUCTIONS  Pay attention to any changes in your symptoms. Take these actions to help with your discomfort:  Take medicines only as told by your health care provider.  If you were prescribed an antibiotic medicine, take it as told by your health care provider. Do not stop taking the antibiotic even if you start to feel better.  Talk with your health care provider before you take a cough suppressant medicine.  Drink enough fluid to keep your urine clear or pale  yellow.  If the air is dry, use a cold steam vaporizer or humidifier in your bedroom or your home to help loosen secretions.  Avoid anything that causes you to cough at work or at home.  If your cough is worse at night, try sleeping in a semi-upright position.  Avoid cigarette smoke. If you smoke, quit smoking. If you need help quitting, ask your health care provider.  Avoid caffeine.  Avoid alcohol.  Rest as needed. SEEK MEDICAL CARE IF:   You have new symptoms.  You cough up pus.  Your cough does not get better after 2-3 weeks, or your cough gets worse.  You cannot control your cough with suppressant medicines and you are losing sleep.  You develop pain that is getting worse or pain that is not controlled with pain medicines.  You have a fever.  You have unexplained weight loss.  You have night sweats. SEEK IMMEDIATE MEDICAL CARE IF:  You cough up blood.  You have difficulty breathing.  Your heartbeat is very fast.   This information is not intended to replace advice given to you by your health care provider. Make sure you discuss any questions you have with your health care provider.   Document Released: 12/12/2010 Document Revised: 03/06/2015 Document Reviewed: 08/22/2014 Elsevier Interactive Patient Education [employer].

## 2015-04-10 ENCOUNTER — Ambulatory Visit (INDEPENDENT_AMBULATORY_CARE_PROVIDER_SITE_OTHER): Payer: 59 | Admitting: Physician Assistant

## 2015-04-10 ENCOUNTER — Encounter: Payer: Self-pay | Admitting: Physician Assistant

## 2015-04-10 VITALS — BP 122/60 | HR 120 | Temp 98.3°F | Resp 16 | Wt 208.4 lb

## 2015-04-10 DIAGNOSIS — F329 Major depressive disorder, single episode, unspecified: Secondary | ICD-10-CM | POA: Diagnosis not present

## 2015-04-10 DIAGNOSIS — F32A Depression, unspecified: Secondary | ICD-10-CM

## 2015-04-10 DIAGNOSIS — F419 Anxiety disorder, unspecified: Secondary | ICD-10-CM

## 2015-04-10 DIAGNOSIS — E162 Hypoglycemia, unspecified: Secondary | ICD-10-CM | POA: Diagnosis not present

## 2015-04-10 MED ORDER — CITALOPRAM HYDROBROMIDE 20 MG PO TABS: 20.0000 mg | ORAL_TABLET | Freq: Every day | ORAL | Status: DC

## 2015-04-10 NOTE — Patient Instructions (Signed)
Generalized Anxiety Disorder Generalized anxiety disorder (GAD) is a mental disorder. It interferes with life functions, including relationships, work, and school. GAD is different from normal anxiety, which everyone experiences at some point in their lives in response to specific life events and activities. Normal anxiety actually helps [place] prepare for and get through these life events and activities. Normal anxiety goes away after the event or activity is over.  GAD causes anxiety that is not necessarily related to specific events or activities. It also causes excess anxiety in proportion to specific events or activities. The anxiety associated with GAD is also difficult to control. GAD can vary from mild to severe. People with severe GAD can have intense waves of anxiety with physical symptoms (panic attacks).  SYMPTOMS The anxiety and worry associated with GAD are difficult to control. This anxiety and worry are related to many life events and activities and also occur more days than not for 6 months or longer. People with GAD also have three or more of the following symptoms (one or more in children):  Restlessness.   Fatigue.  Difficulty concentrating.   Irritability.  Muscle tension.  Difficulty sleeping or unsatisfying sleep. DIAGNOSIS GAD is diagnosed through an assessment by your health care provider. Your health care provider will ask you questions aboutyour mood,physical symptoms, and events in your life. Your health care provider may ask you about your medical history and use of alcohol or drugs, including prescription medicines. Your health care provider may also do a physical exam and blood tests. Certain medical conditions and the use of certain substances can cause symptoms similar to those associated with GAD. Your health care provider may refer you to a mental health specialist for further evaluation. TREATMENT The following therapies are usually used to treat GAD:    Medication. Antidepressant medication usually is prescribed for long-term daily control. Antianxiety medicines may be added in severe cases, especially when panic attacks occur.   Talk therapy (psychotherapy). Certain types of talk therapy can be helpful in treating GAD by providing support, education, and guidance. A form of talk therapy called cognitive behavioral therapy can teach you healthy ways to think about and react to daily life events and activities.  Stress managementtechniques. These include yoga, meditation, and exercise and can be very helpful when they are practiced regularly. A mental health specialist can help determine which treatment is best for you. Some people see improvement with one therapy. However, other people require a combination of therapies.   This information is not intended to replace advice given to you by your health care provider. Make sure you discuss any questions you have with your health care provider.   Document Released: 10/10/2012 Document Revised: 07/06/2014 Document Reviewed: 10/10/2012 Elsevier Interactive Patient Education 2016 [employer].  Exercising to [employer] Exercising can help you to lose weight. In order to lose weight through exercise, you need to do vigorous-intensity exercise. You can tell that you are exercising with vigorous intensity if you are breathing very hard and fast and cannot hold a conversation while exercising. Moderate-intensity exercise helps to maintain your current weight. You can tell that you are exercising at a moderate level if you have a higher heart rate and faster breathing, but you are still able to hold a conversation. HOW OFTEN SHOULD I EXERCISE? Choose an activity that you enjoy and set realistic goals. Your health care provider can help you to make an activity plan that works for you. Exercise regularly as directed by your  health care provider. This may include:  Doing resistance training twice each  week, such as:  Push-ups.  Sit-ups.  Lifting weights.  Using resistance bands.  Doing a given intensity of exercise for a given amount of time. Choose from these options:  150 minutes of moderate-intensity exercise every week.  75 minutes of vigorous-intensity exercise every week.  A mix of moderate-intensity and vigorous-intensity exercise every week. Children, pregnant women, people who are out of shape, people who are overweight, and older adults may need to consult a health care provider for individual recommendations. If you have any sort of medical condition, be sure to consult your health care provider before starting a new exercise program. WHAT ARE SOME ACTIVITIES THAT CAN HELP ME TO LOSE WEIGHT?   Walking at a rate of at least 4.5 miles an hour.  Jogging or running at a rate of 5 miles per hour.  Biking at a rate of at least 10 miles per hour.  Lap swimming.  Roller-skating or in-line skating.  Cross-country skiing.  Vigorous competitive sports, such as football, basketball, and soccer.  Jumping rope.  Aerobic dancing. HOW CAN I BE MORE ACTIVE IN MY DAY-TO-DAY ACTIVITIES?  Use the stairs instead of the elevator.  Take a walk during your lunch break.  If you drive, park your car farther away from work or school.  If you take public transportation, get off one stop early and walk the rest of the way.  Make all of your phone calls while standing up and walking around.  Get up, stretch, and walk around every 30 minutes throughout the day. WHAT GUIDELINES SHOULD I FOLLOW WHILE EXERCISING?  Do not exercise so much that you hurt yourself, feel dizzy, or get very short of breath.  Consult your health care provider prior to starting a new exercise program.  Wear comfortable clothes and shoes with good support.  Drink plenty of water while you exercise to prevent dehydration or heat stroke. Body water is lost during exercise and must be replaced.  Work out  until you breathe faster and your heart beats faster.   This information is not intended to replace advice given to you by your health care provider. Make sure you discuss any questions you have with your health care provider.   Document Released: 07/18/2010 Document Revised: 07/06/2014 Document Reviewed: 11/16/2013 Elsevier Interactive Patient Education [employer].

## 2015-04-10 NOTE — Progress Notes (Signed)
Patient: Laura [name] Female    DOB: 1985-10-23   29 y.o.   MRN: [id number] Visit Date: 04/10/2015  Today's Provider: Mar Daring, PA-C   Chief Complaint  Patient presents with  . Follow-up    Hypoglycemia,Anxiety, Depression   Subjective:    HPI .Depression: Patient complains of depression. She complains of fatigue. Onset was approximately 10 years ago, unchanged since that time.  She denies current suicidal and homicidal plan or intent.   Family history significant for depression and Anxiety.Possible organic causes contributing are: none.  Risk factors: previous episode of depression Previous treatment includes Wellbutrin and medication. She complains of the following side effects from the treatment: dry mouth.    Anxiety: Patient complains of panic attacks.  She has the following symptoms: irritable. Onset of symptoms was approximately last night for an hour, gradually improving since that time. She denies current suicidal and homicidal ideation. Family history significant for depression.Possible organic causes contributing are: none. Risk factors: previous episode of depression Previous treatment includes Xanax and medication.  She complains of the following side effects from the treatment: weight gain.  Hypoglycemia: Per patient last episode of sugar drooping was since last office visit. Patient reports having one last week of 70.    No Known Allergies Previous Medications   ACETAMINOPHEN (TYLENOL) 500 MG TABLET    Take 500 mg by mouth every 6 (six) hours as needed.   ALBUTEROL (PROVENTIL HFA;VENTOLIN HFA) 108 (90 BASE) MCG/ACT INHALER    Inhale 2 puffs into the lungs every 6 (six) hours as needed for wheezing or shortness of breath.   ALPRAZOLAM (XANAX) 1 MG TABLET    Take 1 tablet (1 mg total) by mouth 2 (two) times daily as needed for anxiety.   AZITHROMYCIN (ZITHROMAX Z-PAK) 250 MG TABLET    As directed   BUPROPION (WELLBUTRIN XL) 300 MG 24 HR TABLET    Take  1 tablet (300 mg total) by mouth daily.   CHLORPHENIRAMINE-HYDROCODONE (TUSSIONEX PENNKINETIC ER) 10-8 MG/5ML SUER    Take 5 mLs by mouth every 12 (twelve) hours as needed for cough.   LEVONORGESTREL-ETHINYL ESTRADIOL (JOLESSA) 0.15-0.03 MG TABLET    Take 1 tablet by mouth daily.   PREDNISONE (STERAPRED UNI-PAK 21 TAB) 10 MG (21) TBPK TABLET    Take 1 tablet (10 mg total) by mouth daily. As directed with breakfast and lunch.    Review of Systems  Constitutional: Positive for appetite change (snacking more) and fatigue. Negative for fever and chills.  HENT: Positive for voice change. Negative for congestion.   Eyes: Negative.   Respiratory: Positive for cough (currently under treatment for bronchitis). Negative for chest tightness, shortness of breath and wheezing.   Cardiovascular: Positive for palpitations (occasionally with anxiety). Negative for chest pain.  Gastrointestinal: Negative.   Endocrine: Negative.   Genitourinary: Negative.   Musculoskeletal: Negative.   Psychiatric/Behavioral: Positive for dysphoric mood and agitation. Negative for suicidal ideas and self-injury. The patient is nervous/anxious.     Social History  Substance Use Topics  . Smoking status: Current Every Day Smoker -- 0.50 packs/day  . Smokeless tobacco: Never Used  . Alcohol Use: 0.0 oz/week    0 Standard drinks or equivalent per week     Comment: occasionally; beer   Objective:   BP 122/60 mmHg  Pulse 120  Temp(Src) 98.3 F (36.8 C) (Oral)  Resp 16  Wt 208 lb 6.4 oz (94.53 kg)  Physical Exam  Constitutional: She  appears well-developed and well-nourished. No distress.  HENT:  Head: Normocephalic and atraumatic.  Cardiovascular: Regular rhythm and normal heart sounds.  Tachycardia present.  Exam reveals no gallop and no friction rub.   No murmur heard. Pulmonary/Chest: Effort normal and breath sounds normal. No respiratory distress. She has no wheezes. She has no rales.  Skin: She is not  diaphoretic.  Psychiatric: She has a normal mood and affect. Her behavior is normal. Judgment and thought content normal.  Vitals reviewed.       Assessment & Plan:     1. Anxiety Still has anxiety and panic attacks. She states that she may have one to 2 panic attacks a week now. The Xanax 1 mg does help when she does have a panic attack. I advised her to let me know when she needs a refill with her Xanax that she does not need one at this time. I will follow-up with her in 4 weeks to recheck to see how she is doing.  2. Depression Had increased irritability and anger while on Wellbutrin. We'll discontinue Wellbutrin and start Celexa 20 mg as below. I did advise her to call me in approximately 2 weeks to see how this medication is doing to see if we need to increase the dose before I see her back in 4 weeks for follow-up. - citalopram (CELEXA) 20 MG tablet; Take 1 tablet (20 mg total) by mouth daily.  Dispense: 60 tablet; Refill: 0  3. Hypoglycemia This has been improving. She states she has not had any episodes like she has previously. She however is concern for weight gain. States she has gained 3 pounds since previous visit. She does state that she feels like she is constantly snacking and feels this is secondary to cutting back on smoking as well as the Wellbutrin. We will change the Wellbutrin as above to citalopram. When I see her back in 4 weeks we will discuss her weight at that time. If she is doing well with the Celexa we may add an appetite suppressant to help with weight loss.       Mar Daring, PA-C  [facility]

## 2015-04-19 ENCOUNTER — Ambulatory Visit (INDEPENDENT_AMBULATORY_CARE_PROVIDER_SITE_OTHER): Payer: 59 | Admitting: Physician Assistant

## 2015-04-19 ENCOUNTER — Encounter: Payer: Self-pay | Admitting: Physician Assistant

## 2015-04-19 VITALS — BP 120/78 | HR 102 | Temp 97.9°F | Resp 16 | Wt 207.0 lb

## 2015-04-19 DIAGNOSIS — J0140 Acute pansinusitis, unspecified: Secondary | ICD-10-CM

## 2015-04-19 DIAGNOSIS — T3695XA Adverse effect of unspecified systemic antibiotic, initial encounter: Secondary | ICD-10-CM

## 2015-04-19 DIAGNOSIS — R05 Cough: Secondary | ICD-10-CM | POA: Diagnosis not present

## 2015-04-19 DIAGNOSIS — B379 Candidiasis, unspecified: Secondary | ICD-10-CM | POA: Diagnosis not present

## 2015-04-19 DIAGNOSIS — R059 Cough, unspecified: Secondary | ICD-10-CM

## 2015-04-19 MED ORDER — PREDNISONE 10 MG (21) PO TBPK: ORAL_TABLET | ORAL | Status: DC

## 2015-04-19 MED ORDER — FLUCONAZOLE 150 MG PO TABS: ORAL_TABLET | ORAL | Status: DC

## 2015-04-19 MED ORDER — HYDROCOD POLST-CPM POLST ER 10-8 MG/5ML PO SUER: 5.0000 mL | Freq: Two times a day (BID) | ORAL | Status: DC | PRN

## 2015-04-19 MED ORDER — AMOXICILLIN-POT CLAVULANATE 875-125 MG PO TABS: 1.0000 | ORAL_TABLET | Freq: Two times a day (BID) | ORAL | Status: DC

## 2015-04-19 NOTE — Progress Notes (Signed)
Patient: Laura [name] Female    DOB: 1986-05-14   28 y.o.   MRN: [id number] Visit Date: 04/19/2015  Today's Provider: Mar Daring, PA-C   Chief Complaint  Patient presents with  . URI   Subjective:    URI  This is a recurrent problem. The current episode started 1 to 4 weeks ago. There has been no fever. Associated symptoms include congestion, coughing (Cough is usually dry in the morning), ear pain (Bilateral), headaches, a plugged ear sensation and a sore throat (scratchy and itching. The throat is sore on the right side.). Pertinent negatives include no chest pain, nausea, vomiting or wheezing. She has tried decongestant (Tussionex and Albuterol inhaler as needed. Patient was also taking Z-PAK. and Prednisone) for the symptoms.   she states that she was treated approximately 2 weeks ago and actually did see improvement. The symptoms all then returned approximately one week after finishing treatment and is more severe than it was the first time. She states her cough is a little bit improved but she has more pressure within the head and sinus congestion. She also has the discomfort of the right ear.     No Known Allergies Previous Medications   ACETAMINOPHEN (TYLENOL) 500 MG TABLET    Take 500 mg by mouth every 6 (six) hours as needed.   ALBUTEROL (PROVENTIL HFA;VENTOLIN HFA) 108 (90 BASE) MCG/ACT INHALER    Inhale 2 puffs into the lungs every 6 (six) hours as needed for wheezing or shortness of breath.   ALPRAZOLAM (XANAX) 1 MG TABLET    Take 1 tablet (1 mg total) by mouth 2 (two) times daily as needed for anxiety.   AZITHROMYCIN (ZITHROMAX Z-PAK) 250 MG TABLET    As directed   CHLORPHENIRAMINE-HYDROCODONE (TUSSIONEX PENNKINETIC ER) 10-8 MG/5ML SUER    Take 5 mLs by mouth every 12 (twelve) hours as needed for cough.   CITALOPRAM (CELEXA) 20 MG TABLET    Take 1 tablet (20 mg total) by mouth daily.   LEVONORGESTREL-ETHINYL ESTRADIOL (JOLESSA) 0.15-0.03 MG TABLET     Take 1 tablet by mouth daily.   PREDNISONE (STERAPRED UNI-PAK 21 TAB) 10 MG (21) TBPK TABLET    Take 1 tablet (10 mg total) by mouth daily. As directed with breakfast and lunch.    Review of Systems  Constitutional: Negative for fever, chills and fatigue.  HENT: Positive for congestion, ear pain (Bilateral), sinus pressure and sore throat (scratchy and itching. The throat is sore on the right side.).   Eyes: Negative.   Respiratory: Positive for cough (Cough is usually dry in the morning). Negative for chest tightness, shortness of breath and wheezing.   Cardiovascular: Negative for chest pain and palpitations.  Gastrointestinal: Negative for nausea and vomiting.  Neurological: Positive for headaches. Negative for dizziness and numbness.    Social History  Substance Use Topics  . Smoking status: Current Every Day Smoker -- 0.50 packs/day  . Smokeless tobacco: Never Used  . Alcohol Use: 0.0 oz/week    0 Standard drinks or equivalent per week     Comment: occasionally; beer   Objective:   BP 120/78 mmHg  Pulse 102  Temp(Src) 97.9 F (36.6 C) (Oral)  Resp 16  Wt 207 lb (93.895 kg)  SpO2 98%  Physical Exam  Constitutional: She appears well-developed and well-nourished. No distress.  HENT:  Head: Normocephalic and atraumatic.  Right Ear: Hearing, external ear and ear canal normal. Tympanic membrane is bulging. Tympanic membrane  is not erythematous. A middle ear effusion (clear, serous fluid) is present.  Left Ear: Hearing, tympanic membrane, external ear and ear canal normal. Tympanic membrane is not erythematous and not bulging.  No middle ear effusion.  Nose: Mucosal edema present. Right sinus exhibits maxillary sinus tenderness and frontal sinus tenderness. Left sinus exhibits maxillary sinus tenderness and frontal sinus tenderness.  Mouth/Throat: Uvula is midline, oropharynx is clear and moist and mucous membranes are normal. No oropharyngeal exudate.  Eyes: Conjunctivae and EOM  are normal. Pupils are equal, round, and reactive to light. Right eye exhibits no discharge. Left eye exhibits no discharge.  Neck: Normal range of motion. Neck supple. No JVD present. No tracheal deviation present. No Brudzinski's sign and no Kernig's sign noted. No thyromegaly present.  Cardiovascular: Normal rate, regular rhythm and normal heart sounds.  Exam reveals no gallop and no friction rub.   No murmur heard. Pulmonary/Chest: Effort normal. No stridor. No respiratory distress. She has decreased breath sounds (throughout; tight sound). She has no wheezes. She has no rales. She exhibits no tenderness.  Lymphadenopathy:    She has no cervical adenopathy.  Skin: Skin is warm and dry.        Assessment & Plan:     1. Acute pansinusitis, recurrence not specified She did have some relief with the Z-Pak which she took previously but the symptoms returned. I will treat her with Augmentin to see if she gets better relief. She is to call the office if symptoms fail to improve or worsen. - amoxicillin-clavulanate (AUGMENTIN) 875-125 MG tablet; Take 1 tablet by mouth 2 (two) times daily.  Dispense: 20 tablet; Refill: 0  2. Cough Still with persistent dry cough. She states it is worse at night when she lays down. The Tussionex that she had previously worked well to help her sleep. I will refill the Tussionex as below. I will also refill a 6 day prednisone taper as below. She states she did notice some improvement with her cough while on both of these. She also has her albuterol inhaler if it is needed for shortness of breath. She is to call the office if symptoms fail to improve or worsen. - chlorpheniramine-HYDROcodone (TUSSIONEX PENNKINETIC ER) 10-8 MG/5ML SUER; Take 5 mLs by mouth every 12 (twelve) hours as needed for cough.  Dispense: 140 mL; Refill: 0 - predniSONE (STERAPRED UNI-PAK 21 TAB) 10 MG (21) TBPK tablet; Take as directed.  Dispense: 21 tablet; Refill: 0  3. Antibiotic-induced yeast  infection This was prescribed just in case she was to develop a yeast infection over the weekend from the Augmentin. She may either decide to go ahead and fill this prescription to have on hand or to wait and see if she develops a yeast infection. She states she does have a history of yeast infections with amoxicillin and that is why I wanted to go ahead and have this medication ready for her. - fluconazole (DIFLUCAN) 150 MG tablet; Take one tablet PO once; may repeat in 72 hrs if needed  Dispense: 1 tablet; Refill: 0       Mar Daring, PA-C  [facility]

## 2015-04-19 NOTE — Patient Instructions (Signed)
Barotitis Media Barotitis media is inflammation of your middle ear. This occurs when the auditory tube (eustachian tube) leading from the back of your nose (nasopharynx) to your eardrum is blocked. This blockage may result from a cold, environmental allergies, or an upper respiratory infection. Unresolved barotitis media may lead to damage or hearing loss (barotrauma), which may become permanent. HOME CARE INSTRUCTIONS   Use medicines as recommended by your health care provider. Over-the-counter medicines will help unblock the canal and can help during times of air travel.  Do not put anything into your ears to clean or unplug them. Eardrops will not be helpful.  Do not swim, dive, or fly until your health care provider says it is all right to do so. If these activities are necessary, chewing gum with frequent, forceful swallowing may help. It is also helpful to hold your nose and gently blow to pop your ears for equalizing pressure changes. This forces air into the eustachian tube.  Only take over-the-counter or prescription medicines for pain, discomfort, or fever as directed by your health care provider.  A decongestant may be helpful in decongesting the middle ear and make pressure equalization easier. SEEK MEDICAL CARE IF:  You experience a serious form of dizziness in which you feel as if the room is spinning and you feel nauseated (vertigo).  Your symptoms only involve one ear. SEEK IMMEDIATE MEDICAL CARE IF:   You develop a severe headache, dizziness, or severe ear pain.  You have bloody or pus-like drainage from your ears.  You develop a fever.  Your problems do not improve or become worse. MAKE SURE YOU:   Understand these instructions.  Will watch your condition.  Will get help right away if you are not doing well or get worse.   This information is not intended to replace advice given to you by your health care provider. Make sure you discuss any questions you have with  your health care provider.   Document Released: 06/12/2000 Document Revised: 04/05/2013 Document Reviewed: 01/10/2013 Elsevier Interactive Patient Education 2016 Elsevier Inc. Sinusitis, Adult Sinusitis is redness, soreness, and inflammation of the paranasal sinuses. Paranasal sinuses are air pockets within the bones of your face. They are located beneath your eyes, in the middle of your forehead, and above your eyes. In healthy paranasal sinuses, mucus is able to drain out, and air is able to circulate through them by way of your nose. However, when your paranasal sinuses are inflamed, mucus and air can become trapped. This can allow bacteria and other germs to grow and cause infection. Sinusitis can develop quickly and last only a short time (acute) or continue over a long period (chronic). Sinusitis that lasts for more than 12 weeks is considered chronic. CAUSES Causes of sinusitis include:  Allergies.  Structural abnormalities, such as displacement of the cartilage that separates your nostrils (deviated septum), which can decrease the air flow through your nose and sinuses and affect sinus drainage.  Functional abnormalities, such as when the small hairs (cilia) that line your sinuses and help remove mucus do not work properly or are not present. SIGNS AND SYMPTOMS Symptoms of acute and chronic sinusitis are the same. The primary symptoms are pain and pressure around the affected sinuses. Other symptoms include:  Upper toothache.  Earache.  Headache.  Bad breath.  Decreased sense of smell and taste.  A cough, which worsens when you are lying flat.  Fatigue.  Fever.  Thick drainage from your nose, which often is green  and may contain pus (purulent).  Swelling and warmth over the affected sinuses. DIAGNOSIS Your health care provider will perform a physical exam. During your exam, your health care provider may perform any of the following to help determine if you have acute  sinusitis or chronic sinusitis:  Look in your nose for signs of abnormal growths in your nostrils (nasal polyps).  Tap over the affected sinus to check for signs of infection.  View the inside of your sinuses using an imaging device that has a light attached (endoscope). If your health care provider suspects that you have chronic sinusitis, one or more of the following tests may be recommended:  Allergy tests.  Nasal culture. A sample of mucus is taken from your nose, sent to a lab, and screened for bacteria.  Nasal cytology. A sample of mucus is taken from your nose and examined by your health care provider to determine if your sinusitis is related to an allergy. TREATMENT Most cases of acute sinusitis are related to a viral infection and will resolve on their own within 10 days. Sometimes, medicines are prescribed to help relieve symptoms of both acute and chronic sinusitis. These may include pain medicines, decongestants, nasal steroid sprays, or saline sprays. However, for sinusitis related to a bacterial infection, your health care provider will prescribe antibiotic medicines. These are medicines that will help kill the bacteria causing the infection. Rarely, sinusitis is caused by a fungal infection. In these cases, your health care provider will prescribe antifungal medicine. For some cases of chronic sinusitis, surgery is needed. Generally, these are cases in which sinusitis recurs more than 3 times per year, despite other treatments. HOME CARE INSTRUCTIONS  Drink plenty of water. Water helps thin the mucus so your sinuses can drain more easily.  Use a humidifier.  Inhale steam 3-4 times a day (for example, sit in the bathroom with the shower running).  Apply a warm, moist washcloth to your face 3-4 times a day, or as directed by your health care provider.  Use saline nasal sprays to help moisten and clean your sinuses.  Take medicines only as directed by your health care  provider.  If you were prescribed either an antibiotic or antifungal medicine, finish it all even if you start to feel better. SEEK IMMEDIATE MEDICAL CARE IF:  You have increasing pain or severe headaches.  You have nausea, vomiting, or drowsiness.  You have swelling around your face.  You have vision problems.  You have a stiff neck.  You have difficulty breathing.   This information is not intended to replace advice given to you by your health care provider. Make sure you discuss any questions you have with your health care provider.   Document Released: 06/15/2005 Document Revised: 07/06/2014 Document Reviewed: 06/30/2011 Elsevier Interactive Patient Education [employer].

## 2015-04-23 ENCOUNTER — Telehealth: Payer: Self-pay | Admitting: Family Medicine

## 2015-04-23 MED ORDER — TERCONAZOLE 0.4 % VA CREA: 1.0000 | TOPICAL_CREAM | Freq: Every day | VAGINAL | Status: DC

## 2015-04-23 NOTE — Telephone Encounter (Signed)
Advised patient as below.  

## 2015-04-23 NOTE — Telephone Encounter (Signed)
Pt thinks she has yeast infection due to Antibiotic. Pt is having external symptoms: swelling, itching, & burning. Pt would like a cream sent to [facility] employee pharmacy. Thanks TNP

## 2015-04-23 NOTE — Telephone Encounter (Signed)
Sent rx. Please notify patient. Thanks.   

## 2015-04-25 ENCOUNTER — Other Ambulatory Visit: Payer: Self-pay | Admitting: Physician Assistant

## 2015-04-25 DIAGNOSIS — R05 Cough: Secondary | ICD-10-CM

## 2015-04-25 DIAGNOSIS — R059 Cough, unspecified: Secondary | ICD-10-CM

## 2015-04-25 MED ORDER — HYDROCOD POLST-CPM POLST ER 10-8 MG/5ML PO SUER
5.0000 mL | Freq: Two times a day (BID) | ORAL | Status: DC | PRN
Start: 2015-04-25 — End: 2015-05-08

## 2015-04-25 MED ORDER — PREDNISONE 10 MG (21) PO TBPK: ORAL_TABLET | ORAL | Status: DC

## 2015-05-01 ENCOUNTER — Other Ambulatory Visit: Payer: Self-pay

## 2015-05-01 DIAGNOSIS — F419 Anxiety disorder, unspecified: Secondary | ICD-10-CM

## 2015-05-02 MED ORDER — ALPRAZOLAM 1 MG PO TABS: 1.0000 mg | ORAL_TABLET | Freq: Two times a day (BID) | ORAL | Status: DC | PRN

## 2015-05-06 NOTE — Telephone Encounter (Signed)
Prescription called into [facility] employee pharmacy.  Thanks,  -Rhilee Currin

## 2015-05-08 ENCOUNTER — Ambulatory Visit (INDEPENDENT_AMBULATORY_CARE_PROVIDER_SITE_OTHER): Payer: 59 | Admitting: Physician Assistant

## 2015-05-08 ENCOUNTER — Ambulatory Visit: Payer: 59 | Admitting: Physician Assistant

## 2015-05-08 ENCOUNTER — Encounter: Payer: Self-pay | Admitting: Physician Assistant

## 2015-05-08 VITALS — BP 118/70 | HR 104 | Temp 98.2°F | Resp 16 | Ht 62.0 in | Wt 207.8 lb

## 2015-05-08 DIAGNOSIS — Z6838 Body mass index (BMI) 38.0-38.9, adult: Secondary | ICD-10-CM

## 2015-05-08 DIAGNOSIS — Z713 Dietary counseling and surveillance: Secondary | ICD-10-CM | POA: Diagnosis not present

## 2015-05-08 MED ORDER — PHENTERMINE HCL 15 MG PO CAPS: 15.0000 mg | ORAL_CAPSULE | ORAL | Status: DC
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
## 2015-05-08 NOTE — Patient Instructions (Signed)
Exercising to Lose Weight Exercising can help you to lose weight. In order to lose weight through exercise, you need to do vigorous-intensity exercise. You can tell that you are exercising with vigorous intensity if you are breathing very hard and fast and cannot hold a conversation while exercising. Moderate-intensity exercise helps to maintain your current weight. You can tell that you are exercising at a moderate level if you have a higher heart rate and faster breathing, but you are still able to hold a conversation. HOW OFTEN SHOULD I EXERCISE? Choose an activity that you enjoy and set realistic goals. Your health care provider can help you to make an activity plan that works for you. Exercise regularly as directed by your health care provider. This may include:  Doing resistance training twice each week, such as:  Push-ups.  Sit-ups.  Lifting weights.  Using resistance bands.  Doing a given intensity of exercise for a given amount of time. Choose from these options:  150 minutes of moderate-intensity exercise every week.  75 minutes of vigorous-intensity exercise every week.  A mix of moderate-intensity and vigorous-intensity exercise every week. Children, pregnant women, people who are out of shape, people who are overweight, and older adults may need to consult a health care provider for individual recommendations. If you have any sort of medical condition, be sure to consult your health care provider before starting a new exercise program. WHAT ARE SOME ACTIVITIES THAT CAN HELP ME TO LOSE WEIGHT?   Walking at a rate of at least 4.5 miles an hour.  Jogging or running at a rate of 5 miles per hour.  Biking at a rate of at least 10 miles per hour.  Lap swimming.  Roller-skating or in-line skating.  Cross-country skiing.  Vigorous competitive sports, such as football, basketball, and soccer.  Jumping rope.  Aerobic dancing. HOW CAN I BE MORE ACTIVE IN MY DAY-TO-DAY  ACTIVITIES?  Use the stairs instead of the elevator.  Take a walk during your lunch break.  If you drive, park your car farther away from work or school.  If you take public transportation, get off one stop early and walk the rest of the way.  Make all of your phone calls while standing up and walking around.  Get up, stretch, and walk around every 30 minutes throughout the day. WHAT GUIDELINES SHOULD I FOLLOW WHILE EXERCISING?  Do not exercise so much that you hurt yourself, feel dizzy, or get very short of breath.  Consult your health care provider prior to starting a new exercise program.  Wear comfortable clothes and shoes with good support.  Drink plenty of water while you exercise to prevent dehydration or heat stroke. Body water is lost during exercise and must be replaced.  Work out until you breathe faster and your heart beats faster.   This information is not intended to replace advice given to you by your health care provider. Make sure you discuss any questions you have with your health care provider.   Document Released: 07/18/2010 Document Revised: 07/06/2014 Document Reviewed: 11/16/2013 Elsevier Interactive Patient Education 2016 [place] for [employer] Calories are energy you get from the things you eat and drink. Your body uses this energy to keep you going throughout the day. The number of calories you eat affects your weight. When you eat more calories than your body needs, your body stores the extra calories as fat. When you eat fewer calories than your body needs, your  body burns fat to get the energy it needs. Calorie counting means keeping track of how many calories you eat and drink each day. If you make sure to eat fewer calories than your body needs, you should lose weight. In order for calorie counting to work, you will need to eat the number of calories that are right for you in a day to lose a healthy amount of weight per week.  A healthy amount of weight to lose per week is usually 1-2 lb (0.5-0.9 kg). A dietitian can determine how many calories you need in a day and give you suggestions on how to reach your calorie goal.  WHAT IS MY MY PLAN? My goal is to have 1200-1500 calories per day.  If I have this many calories per day, I should lose around 1-2 pounds per week. WHAT DO I NEED TO KNOW ABOUT CALORIE COUNTING? In order to meet your daily calorie goal, you will need to:  Find out how many calories are in each food you would like to eat. Try to do this before you eat.  Decide how much of the food you can eat.  Write down what you ate and how many calories it had. Doing this is called keeping a food log. WHERE DO I FIND CALORIE INFORMATION? The number of calories in a food can be found on a Nutrition Facts label. Note that all the information on a label is based on a specific serving of the food. If a food does not have a Nutrition Facts label, try to look up the calories online or ask your dietitian for help. HOW DO I DECIDE HOW MUCH TO EAT? To decide how much of the food you can eat, you will need to consider both the number of calories in one serving and the size of one serving. This information can be found on the Nutrition Facts label. If a food does not have a Nutrition Facts label, look up the information online or ask your dietitian for help. Remember that calories are listed per serving. If you choose to have more than one serving of a food, you will have to multiply the calories per serving by the amount of servings you plan to eat. For example, the label on a package of bread might say that a serving size is 1 slice and that there are 90 calories in a serving. If you eat 1 slice, you will have eaten 90 calories. If you eat 2 slices, you will have eaten 180 calories. HOW DO I KEEP A FOOD LOG? After each meal, record the following information in your food log:  What you ate.  How much of it you ate.  How  many calories it had.  Then, add up your calories. Keep your food log near you, such as in a small notebook in your pocket. Another option is to use a mobile app or website. Some programs will calculate calories for you and show you how many calories you have left each time you add an item to the log. WHAT ARE SOME CALORIE COUNTING TIPS?  Use your calories on foods and drinks that will fill you up and not leave you hungry. Some examples of this include foods like nuts and nut butters, vegetables, lean proteins, and high-fiber foods (more than 5 g fiber per serving).  Eat nutritious foods and avoid empty calories. Empty calories are calories you get from foods or beverages that do not have many nutrients, such as candy and  soda. It is better to have a nutritious high-calorie food (such as an avocado) than a food with few nutrients (such as a bag of chips).  Know how many calories are in the foods you eat most often. This way, you do not have to look up how many calories they have each time you eat them.  Look out for foods that may seem like low-calorie foods but are really high-calorie foods, such as baked goods, soda, and fat-free candy.  Pay attention to calories in drinks. Drinks such as sodas, specialty coffee drinks, alcohol, and juices have a lot of calories yet do not fill you up. Choose low-calorie drinks like water and diet drinks.  Focus your calorie counting efforts on higher calorie items. Logging the calories in a garden salad that contains only vegetables is less important than calculating the calories in a milk shake.  Find a way of tracking calories that works for you. Get creative. Most people who are successful find ways to keep track of how much they eat in a day, even if they do not count every calorie. WHAT ARE SOME PORTION CONTROL TIPS?  Know how many calories are in a serving. This will help you know how many servings of a certain food you can have.  Use a measuring cup  to measure serving sizes. This is helpful when you start out. With time, you will be able to estimate serving sizes for some foods.  Take some time to put servings of different foods on your favorite plates, bowls, and cups so you know what a serving looks like.  Try not to eat straight from a bag or box. Doing this can lead to overeating. Put the amount you would like to eat in a cup or on a plate to make sure you are eating the right portion.  Use smaller plates, glasses, and bowls to prevent overeating. This is a quick and easy way to practice portion control. If your plate is smaller, less food can fit on it.  Try not to multitask while eating, such as watching TV or using your computer. If it is time to eat, sit down at a table and enjoy your food. Doing this will help you to start recognizing when you are full. It will also make you more aware of what and how much you are eating. HOW CAN I CALORIE COUNT WHEN EATING OUT?  Ask for smaller portion sizes or child-sized portions.  Consider sharing an entree and sides instead of getting your own entree.  If you get your own entree, eat only half. Ask for a box at the beginning of your meal and put the rest of your entree in it so you are not tempted to eat it.  Look for the calories on the menu. If calories are listed, choose the lower calorie options.  Choose dishes that include vegetables, fruits, whole grains, low-fat dairy products, and lean protein. Focusing on smart food choices from each of the 5 food groups can help you stay on track at restaurants.  Choose items that are boiled, broiled, grilled, or steamed.  Choose water, milk, unsweetened iced tea, or other drinks without added sugars. If you want an alcoholic beverage, choose a lower calorie option. For example, a regular margarita can have up to 700 calories and a glass of wine has around 150.  Stay away from items that are buttered, battered, fried, or served with cream sauce.  Items labeled "crispy" are usually fried, unless stated  otherwise.  Ask for dressings, sauces, and syrups on the side. These are usually very high in calories, so do not eat much of them.  Watch out for salads. Many people think salads are a healthy option, but this is often not the case. Many salads come with bacon, fried chicken, lots of cheese, fried chips, and dressing. All of these items have a lot of calories. If you want a salad, choose a garden salad and ask for grilled meats or steak. Ask for the dressing on the side, or ask for olive oil and vinegar or lemon to use as dressing.  Estimate how many servings of a food you are given. For example, a serving of cooked rice is  cup or about the size of half a tennis ball or one cupcake wrapper. Knowing serving sizes will help you be aware of how much food you are eating at restaurants. The list below tells you how big or small some common portion sizes are based on everyday objects.  1 oz--4 stacked dice.  3 oz--1 deck of cards.  1 tsp--1 dice.  1 Tbsp-- a Ping-Pong ball.  2 Tbsp--1 Ping-Pong ball.   cup--1 tennis ball or 1 cupcake wrapper.  1 cup--1 baseball.   This information is not intended to replace advice given to you by your health care provider. Make sure you discuss any questions you have with your health care provider.   Document Released: 06/15/2005 Document Revised: 07/06/2014 Document Reviewed: 04/20/2013 Elsevier Interactive Patient Education [employer].

## 2015-05-09 ENCOUNTER — Ambulatory Visit: Payer: 59 | Admitting: Physician Assistant

## 2015-05-27 ENCOUNTER — Other Ambulatory Visit: Payer: Self-pay | Admitting: Physician Assistant

## 2015-05-27 DIAGNOSIS — E669 Obesity, unspecified: Secondary | ICD-10-CM

## 2015-05-27 MED ORDER — PHENTERMINE HCL 30 MG PO CAPS: 30.0000 mg | ORAL_CAPSULE | ORAL | Status: DC

## 2015-06-05 ENCOUNTER — Ambulatory Visit (INDEPENDENT_AMBULATORY_CARE_PROVIDER_SITE_OTHER): Payer: 59 | Admitting: Physician Assistant

## 2015-06-05 ENCOUNTER — Encounter: Payer: Self-pay | Admitting: Physician Assistant

## 2015-06-05 VITALS — BP 110/70 | HR 105 | Temp 98.1°F | Resp 16 | Wt 205.4 lb

## 2015-06-05 DIAGNOSIS — F419 Anxiety disorder, unspecified: Secondary | ICD-10-CM

## 2015-06-05 DIAGNOSIS — F329 Major depressive disorder, single episode, unspecified: Secondary | ICD-10-CM | POA: Diagnosis not present

## 2015-06-05 DIAGNOSIS — F32A Depression, unspecified: Secondary | ICD-10-CM

## 2015-06-05 MED ORDER — ALPRAZOLAM 1 MG PO TABS: 1.0000 mg | ORAL_TABLET | Freq: Two times a day (BID) | ORAL | Status: DC | PRN

## 2015-06-05 MED ORDER — CITALOPRAM HYDROBROMIDE 40 MG PO TABS: 40.0000 mg | ORAL_TABLET | Freq: Every day | ORAL | Status: DC

## 2015-06-05 NOTE — Progress Notes (Signed)
Patient: Laura [name] Female    DOB: 08-20-1985   29 y.o.   MRN: [id number] Visit Date: 06/05/2015  Today's Provider: Mar Daring, PA-C   Chief Complaint  Patient presents with  . Follow-up    Anxiety, Depression, Wt   Subjective:    Anxiety Presents for follow-up visit. Onset was 1 to 4 weeks ago. The problem has been gradually worsening (for the past week). Symptoms include decreased concentration, depressed mood, excessive worry, insomnia, nausea, nervous/anxious behavior, palpitations (the day before yesterday) and panic (for the past week, on a daily basis). Patient reports no chest pain, irritability or suicidal ideas. Symptoms occur most days. The severity of symptoms is causing significant distress, interfering with daily activities and moderate. The symptoms are aggravated by family issues. The patient sleeps 3 hours per night. The quality of sleep is poor. Nighttime awakenings: one to two.   Her past medical history is significant for depression. Past treatments include SSRIs. The treatment provided mild relief. Compliance with prior treatments has been good.  Depression      The patient presents with depression.  Chronicity: follo-up.  The current episode started 1 to 4 weeks ago.   The onset quality is sudden.   The problem occurs constantly.  The most recent episode lasted 3 hours.    The problem has been waxing and waning since onset.  Associated symptoms include decreased concentration, fatigue, hopelessness, insomnia, appetite change and sad (crying every day).  Associated symptoms include not irritable and no suicidal ideas.     The symptoms are aggravated by family issues.  Compliance with treatment is good.  Previous treatment provided mild relief.  Past medical history includes anxiety and depression.    Obesity: Patient complains of obesity. Patient cites health, self image, for physical health as reasons for wanting to lose weight. Last office visit  weight was 208 lbs. Per patient on Nov. 30 was at 202 lbs. Today patients weight is at 205.4. Per patient she is keeping her food diary. Current Exercise Habits walking and sit up at night Current Eating Habits Number of regular meals per day: 2 Number of snacking episodes per day: 1 Who shops for food? patient Who prepares food? patient Who eats with patient? Patient. Sometimes eats with husband but lately has been eating by her self. Binge behavior?: no Purge behavior? no Anorexic behavior? no Eating precipitated by stress? No, but has drinking everyday for the past week. Guilt feelings associated with eating? Yes.  Other Potential Contributing Factors Use of alcohol: average 5 drinks/week     No Known Allergies Previous Medications   ACETAMINOPHEN (TYLENOL) 500 MG TABLET    Take 500 mg by mouth every 6 (six) hours as needed.   ALBUTEROL (PROVENTIL HFA;VENTOLIN HFA) 108 (90 BASE) MCG/ACT INHALER    Inhale 2 puffs into the lungs every 6 (six) hours as needed for wheezing or shortness of breath.   ALPRAZOLAM (XANAX) 1 MG TABLET    Take 1 tablet (1 mg total) by mouth 2 (two) times daily as needed for anxiety.   CITALOPRAM (CELEXA) 20 MG TABLET    Take 1 tablet (20 mg total) by mouth daily.   LEVONORGESTREL-ETHINYL ESTRADIOL (JOLESSA) 0.15-0.03 MG TABLET    Take 1 tablet by mouth daily.   PHENTERMINE 30 MG CAPSULE    Take 1 capsule (30 mg total) by mouth every morning.    Review of Systems  Constitutional: Positive for appetite change and fatigue.  Negative for irritability.  HENT: Negative.   Respiratory: Negative.   Cardiovascular: Positive for palpitations (the day before yesterday). Negative for chest pain and leg swelling.  Gastrointestinal: Positive for nausea. Negative for vomiting, abdominal pain, diarrhea and constipation.  Musculoskeletal: Negative.   Psychiatric/Behavioral: Positive for depression and decreased concentration. Negative for suicidal ideas and self-injury.  The patient is nervous/anxious and has insomnia.     Social History  Substance Use Topics  . Smoking status: Current Every Day Smoker -- 0.50 packs/day  . Smokeless tobacco: Never Used  . Alcohol Use: 0.0 oz/week    0 Standard drinks or equivalent per week     Comment: occasionally; beer   Objective:   BP 110/70 mmHg  Pulse 105  Temp(Src) 98.1 F (36.7 C) (Oral)  Resp 16  Wt 205 lb 6.4 oz (93.169 kg)  Physical Exam  Constitutional: She appears well-developed and well-nourished. She is not irritable. No distress.  Neck: Normal range of motion. Neck supple. No tracheal deviation present. No thyromegaly present.  Cardiovascular: Normal rate, regular rhythm and normal heart sounds.  Exam reveals no gallop and no friction rub.   No murmur heard. Pulmonary/Chest: Effort normal and breath sounds normal. No respiratory distress. She has no wheezes. She has no rales.  Lymphadenopathy:    She has no cervical adenopathy.  Skin: She is not diaphoretic.  Psychiatric: Her speech is normal and behavior is normal. Judgment and thought content normal. Her mood appears not anxious. Her affect is angry. Cognition and memory are normal. She exhibits a depressed mood.  Vitals reviewed.       Assessment & Plan:     1. Anxiety Anxiety and panic attacks have been fairly stable with alprazolam. She does continue to have to take it as needed. She is to take 1 tablet prior to bed to help her sleep. She then may take half a tab to 1 full tab as needed during the day for panic attack. She is to call the office if symptoms fail to improve and continue to worsen. - ALPRAZolam (XANAX) 1 MG tablet; Take 1 tablet (1 mg total) by mouth 2 (two) times daily as needed for anxiety.  Dispense: 60 tablet; Refill: 1  2. Depression She had been improving with citalopram 20 mg but then started to notice symptoms coming back. She did well for approximately 2-3 weeks on the citalopram. She states that she feels her  depression has worsened recently due to outside stressors from her grandmother and her mother. We will increase citalopram to 40 mg daily at bedtime. I will see her back in approximately one to 2 months to evaluate how she is doing with the increase in citalopram. She is to call the office if she has any worsening depression symptoms, suicidal ideations, questions or concerns. - citalopram (CELEXA) 40 MG tablet; Take 1 tablet (40 mg total) by mouth daily.  Dispense: 30 tablet; Refill: [place], PA-C  [facility]

## 2015-06-10 ENCOUNTER — Other Ambulatory Visit: Payer: Self-pay

## 2015-06-10 DIAGNOSIS — F419 Anxiety disorder, unspecified: Secondary | ICD-10-CM

## 2015-06-10 MED ORDER — ALPRAZOLAM 1 MG PO TABS: 1.0000 mg | ORAL_TABLET | Freq: Two times a day (BID) | ORAL | Status: DC | PRN

## 2015-06-10 NOTE — Telephone Encounter (Signed)
Laura [name] is asking for another prescription for her Alprazolam. Per patient lost the other prescription you gave her on 12/07.  Thanks,  -Joseline

## 2015-06-30 HISTORY — PX: TOTAL ABDOMINAL HYSTERECTOMY W/ BILATERAL SALPINGOOPHORECTOMY: SHX83

## 2015-06-30 HISTORY — PX: ABDOMINAL HYSTERECTOMY: SHX81

## 2015-07-09 ENCOUNTER — Other Ambulatory Visit: Payer: Self-pay | Admitting: Physician Assistant

## 2015-07-09 ENCOUNTER — Other Ambulatory Visit: Payer: Self-pay

## 2015-07-09 DIAGNOSIS — E669 Obesity, unspecified: Secondary | ICD-10-CM

## 2015-07-09 MED ORDER — PHENTERMINE HCL 30 MG PO CAPS: 30.0000 mg | ORAL_CAPSULE | ORAL | Status: DC

## 2015-07-17 ENCOUNTER — Encounter: Payer: Self-pay | Admitting: Physician Assistant

## 2015-07-17 ENCOUNTER — Ambulatory Visit (INDEPENDENT_AMBULATORY_CARE_PROVIDER_SITE_OTHER): Payer: 59 | Admitting: Physician Assistant

## 2015-07-17 VITALS — BP 100/60 | HR 100 | Temp 98.2°F | Resp 16 | Wt 205.0 lb

## 2015-07-17 DIAGNOSIS — B379 Candidiasis, unspecified: Secondary | ICD-10-CM | POA: Diagnosis not present

## 2015-07-17 DIAGNOSIS — B9689 Other specified bacterial agents as the cause of diseases classified elsewhere: Secondary | ICD-10-CM

## 2015-07-17 DIAGNOSIS — H6503 Acute serous otitis media, bilateral: Secondary | ICD-10-CM

## 2015-07-17 DIAGNOSIS — N760 Acute vaginitis: Secondary | ICD-10-CM | POA: Diagnosis not present

## 2015-07-17 DIAGNOSIS — F419 Anxiety disorder, unspecified: Secondary | ICD-10-CM | POA: Diagnosis not present

## 2015-07-17 DIAGNOSIS — F32A Depression, unspecified: Secondary | ICD-10-CM

## 2015-07-17 DIAGNOSIS — A499 Bacterial infection, unspecified: Secondary | ICD-10-CM | POA: Diagnosis not present

## 2015-07-17 DIAGNOSIS — F4310 Post-traumatic stress disorder, unspecified: Secondary | ICD-10-CM

## 2015-07-17 DIAGNOSIS — J069 Acute upper respiratory infection, unspecified: Secondary | ICD-10-CM

## 2015-07-17 DIAGNOSIS — T3695XA Adverse effect of unspecified systemic antibiotic, initial encounter: Secondary | ICD-10-CM

## 2015-07-17 DIAGNOSIS — R05 Cough: Secondary | ICD-10-CM

## 2015-07-17 DIAGNOSIS — F329 Major depressive disorder, single episode, unspecified: Secondary | ICD-10-CM

## 2015-07-17 DIAGNOSIS — R059 Cough, unspecified: Secondary | ICD-10-CM

## 2015-07-17 MED ORDER — BUPROPION HCL ER (SR) 150 MG PO TB12: 150.0000 mg | ORAL_TABLET | Freq: Two times a day (BID) | ORAL | Status: DC

## 2015-07-17 MED ORDER — HYDROCODONE-HOMATROPINE 5-1.5 MG/5ML PO SYRP: 5.0000 mL | ORAL_SOLUTION | Freq: Three times a day (TID) | ORAL | Status: DC | PRN

## 2015-07-17 MED ORDER — METRONIDAZOLE 500 MG PO TABS
500.0000 mg | ORAL_TABLET | Freq: Two times a day (BID) | ORAL | Status: DC
Start: 2015-07-17 — End: 2015-08-05

## 2015-07-17 MED ORDER — AMOXICILLIN 875 MG PO TABS: 875.0000 mg | ORAL_TABLET | Freq: Two times a day (BID) | ORAL | Status: DC

## 2015-07-17 MED ORDER — FLUCONAZOLE 150 MG PO TABS: 150.0000 mg | ORAL_TABLET | Freq: Once | ORAL | Status: DC

## 2015-07-17 NOTE — Progress Notes (Signed)
Patient: Laura [name] Female    DOB: 09-Aug-1985   30 y.o.   MRN: [id number] Visit Date: 07/17/2015  Today's Provider: Mar Daring, PA-C   Chief Complaint  Patient presents with  . Vaginal Discharge   Subjective:    Vaginal Discharge The patient's primary symptoms include a genital odor and vaginal discharge. This is a new problem. The current episode started in the past 7 days. The problem occurs constantly. The problem has been gradually worsening. The patient is experiencing no pain. She is not pregnant. Associated symptoms include frequency. Pertinent negatives include no abdominal pain, back pain, chills, constipation, discolored urine, fever, nausea, painful intercourse or vomiting. The vaginal discharge was milky and white. There has been no bleeding. Treatments tried: diflucan. The treatment provided no relief. She is sexually active. No, her partner does not have an STD. She uses tubal ligation for contraception. Her menstrual history has been regular.       No Known Allergies Previous Medications   ACETAMINOPHEN (TYLENOL) 500 MG TABLET    Take 500 mg by mouth every 6 (six) hours as needed. Reported on 07/17/2015   ALBUTEROL (PROVENTIL HFA;VENTOLIN HFA) 108 (90 BASE) MCG/ACT INHALER    Inhale 2 puffs into the lungs every 6 (six) hours as needed for wheezing or shortness of breath.   ALPRAZOLAM (XANAX) 1 MG TABLET    Take 1 tablet (1 mg total) by mouth 2 (two) times daily as needed for anxiety.   BIOTIN PO    Take by mouth 2 (two) times daily.   CITALOPRAM (CELEXA) 40 MG TABLET    Take 1 tablet (40 mg total) by mouth daily.   LEVONORGESTREL-ETHINYL ESTRADIOL (JOLESSA) 0.15-0.03 MG TABLET    Take 1 tablet by mouth daily.   MELATONIN 3 MG TABS    Take by mouth at bedtime.   PHENTERMINE 30 MG CAPSULE    Take 1 capsule (30 mg total) by mouth every morning.    Review of Systems  Constitutional: Negative for fever and chills.  Respiratory: Negative.     Gastrointestinal: Negative for nausea, vomiting, abdominal pain and constipation.  Genitourinary: Positive for frequency and vaginal discharge.  Musculoskeletal: Negative for back pain.  Psychiatric/Behavioral: Positive for sleep disturbance, dysphoric mood and agitation. Negative for suicidal ideas and self-injury. The patient is nervous/anxious.     Social History  Substance Use Topics  . Smoking status: Current Every Day Smoker -- 0.50 packs/day  . Smokeless tobacco: Never Used  . Alcohol Use: 0.0 oz/week    0 Standard drinks or equivalent per week     Comment: occasionally; beer   Objective:   There were no vitals taken for this visit.  Physical Exam  Constitutional: She appears well-developed and well-nourished. No distress.  Neck: Normal range of motion. Neck supple. No tracheal deviation present. No thyromegaly present.  Cardiovascular: Normal rate, regular rhythm and normal heart sounds.  Exam reveals no gallop and no friction rub.   No murmur heard. Pulmonary/Chest: Effort normal and breath sounds normal. No respiratory distress. She has no wheezes. She has no rales.  Abdominal: Soft. Bowel sounds are normal. She exhibits no distension and no mass. There is no tenderness. There is no rebound and no guarding.  Lymphadenopathy:    She has no cervical adenopathy.  Skin: She is not diaphoretic.  Psychiatric: She has a normal mood and affect. Her behavior is normal. Judgment and thought content normal.  Vitals reviewed.  Assessment & Plan:     1. Bilateral acute serous otitis media, recurrence not specified Bilateral ear infections.  Treat with amoxil as below. She needs to get plenty of rest and stay well hydrated.  She may take Mucinx DM for daytime cough and/or congestion. She is to call the office if symptoms worsen. - amoxicillin (AMOXIL) 875 MG tablet; Take 1 tablet (875 mg total) by mouth 2 (two) times daily.  Dispense: 20 tablet; Refill: 0  2. Upper  respiratory infection See above medical treatment plan.  3. Cough See above medical treatment plan.  Hycodan cough syrup given for nighttime cough to help sleep. - HYDROcodone-homatropine (HYCODAN) 5-1.5 MG/5ML syrup; Take 5 mLs by mouth every 8 (eight) hours as needed for cough.  Dispense: 240 mL; Refill: 0  4. BV (bacterial vaginosis) Will treat with metronidazole as below. States she has had BV before and this is similar to the last time. She is to call the office if symptoms do not improve.  - metroNIDAZOLE (FLAGYL) 500 MG tablet; Take 1 tablet (500 mg total) by mouth 2 (two) times daily.  Dispense: 14 tablet; Refill: 0  5. Antibiotic-induced yeast infection Gets yeast infections with amoxil.  Will give diflucan as prophylaxis incase she develops a yeast infection. - fluconazole (DIFLUCAN) 150 MG tablet; Take 1 tablet (150 mg total) by mouth once. May take 2nd pill 24-48 hours after if needed  Dispense: 2 tablet; Refill: 0  6. Depression Not well controlled on Celexa. Increasing depressed mood and agitation.  Increased crying spells. Will switch back to Wellbutrin as this worked the best for her. Will also refer to psychology for counseling.   I will see her back at the beginning of February to see how she is doing. - buPROPion (WELLBUTRIN SR) 150 MG 12 hr tablet; Take 1 tablet (150 mg total) by mouth 2 (two) times daily.  Dispense: 60 tablet; Refill: 1 - Ambulatory referral to Psychology  7. Post traumatic stress disorder See above medical treatment plan. - Ambulatory referral to Psychology  8. Anxiety Controlled with xanax.  See above medical treatment plan. - Ambulatory referral to Psychology       Mar Daring, PA-C  [facility]

## 2015-07-22 ENCOUNTER — Other Ambulatory Visit: Payer: Self-pay | Admitting: Obstetrics and Gynecology

## 2015-08-05 ENCOUNTER — Encounter: Payer: Self-pay | Admitting: Physician Assistant

## 2015-08-05 ENCOUNTER — Ambulatory Visit (INDEPENDENT_AMBULATORY_CARE_PROVIDER_SITE_OTHER): Payer: 59 | Admitting: Physician Assistant

## 2015-08-05 VITALS — BP 112/80 | HR 106 | Temp 98.1°F | Resp 16 | Wt 201.4 lb

## 2015-08-05 DIAGNOSIS — H66004 Acute suppurative otitis media without spontaneous rupture of ear drum, recurrent, right ear: Secondary | ICD-10-CM | POA: Diagnosis not present

## 2015-08-05 MED ORDER — AMOXICILLIN-POT CLAVULANATE 875-125 MG PO TABS: 1.0000 | ORAL_TABLET | Freq: Two times a day (BID) | ORAL | Status: DC

## 2015-08-05 NOTE — Progress Notes (Signed)
Patient: Laura [name] Female    DOB: 1985/08/21   30 y.o.   MRN: [id number] Visit Date: 08/05/2015  Today's Provider: Mar Daring, PA-C   Chief Complaint  Patient presents with  . Otalgia  . Facial Pain   Subjective:    Otalgia  There is pain in the right ear. This is a new problem. The current episode started in the past 7 days (Saturday). The problem occurs constantly. The problem has been gradually worsening. There has been no fever. The pain is at a severity of 8/10. The pain is moderate. Associated symptoms include headaches (on the right side). Pertinent negatives include no abdominal pain, coughing, ear discharge, hearing loss (right ear), sore throat or vomiting. Associated symptoms comments: Makes her feel dizzy, facial pain on the right side with migraine. Nauseas. She has tried acetaminophen (Sudafed, Aleve, midol) for the symptoms. The treatment provided no relief.       No Known Allergies Previous Medications   ACETAMINOPHEN (TYLENOL) 500 MG TABLET    Take 500 mg by mouth every 6 (six) hours as needed. Reported on 07/17/2015   ALBUTEROL (PROVENTIL HFA;VENTOLIN HFA) 108 (90 BASE) MCG/ACT INHALER    Inhale 2 puffs into the lungs every 6 (six) hours as needed for wheezing or shortness of breath.   ALPRAZOLAM (XANAX) 1 MG TABLET    Take 1 tablet (1 mg total) by mouth 2 (two) times daily as needed for anxiety.   BIOTIN PO    Take by mouth 2 (two) times daily.   BUPROPION (WELLBUTRIN SR) 150 MG 12 HR TABLET    Take 1 tablet (150 mg total) by mouth 2 (two) times daily.   FLUCONAZOLE (DIFLUCAN) 150 MG TABLET    Take 1 tablet (150 mg total) by mouth once. May take 2nd pill 24-48 hours after if needed   HYDROCODONE-HOMATROPINE (HYCODAN) 5-1.5 MG/5ML SYRUP    Take 5 mLs by mouth every 8 (eight) hours as needed for cough.   MELATONIN 3 MG TABS    Take by mouth at bedtime. Reported on 08/05/2015   METRONIDAZOLE (FLAGYL) 500 MG TABLET    Take 1 tablet (500 mg total) by  mouth 2 (two) times daily.   PHENTERMINE 30 MG CAPSULE    Take 1 capsule (30 mg total) by mouth every morning.   SETLAKIN 0.15-0.03 MG TABLET    TAKE 1 TABLET BY MOUTH ONCE DAILY    Review of Systems  Constitutional: Positive for fatigue. Negative for fever and chills.  HENT: Positive for ear pain. Negative for ear discharge, hearing loss (right ear), sore throat and tinnitus.   Respiratory: Negative for cough, chest tightness, shortness of breath and wheezing.   Cardiovascular: Negative for chest pain and leg swelling.  Gastrointestinal: Negative for nausea, vomiting and abdominal pain.  Neurological: Positive for headaches (on the right side). Negative for dizziness.    Social History  Substance Use Topics  . Smoking status: Current Every Day Smoker -- 0.50 packs/day  . Smokeless tobacco: Never Used  . Alcohol Use: 0.0 oz/week    0 Standard drinks or equivalent per week     Comment: occasionally; beer   Objective:   BP 112/80 mmHg  Pulse 106  Temp(Src) 98.1 F (36.7 C) (Oral)  Resp 16  Wt 201 lb 6.4 oz (91.354 kg)  LMP   Physical Exam  Constitutional: She appears well-developed and well-nourished. No distress.  HENT:  Head: Normocephalic and atraumatic.  Right Ear:  Hearing, external ear and ear canal normal. Tympanic membrane is erythematous and bulging. A middle ear effusion is present.  Left Ear: Hearing, tympanic membrane, external ear and ear canal normal.  Nose: Mucosal edema and rhinorrhea present. Right sinus exhibits maxillary sinus tenderness. Right sinus exhibits no frontal sinus tenderness. Left sinus exhibits no maxillary sinus tenderness and no frontal sinus tenderness.  Mouth/Throat: Uvula is midline, oropharynx is clear and moist and mucous membranes are normal. No oropharyngeal exudate, posterior oropharyngeal edema or posterior oropharyngeal erythema.  Eyes: Conjunctivae are normal. Pupils are equal, round, and reactive to light. Right eye exhibits no  discharge. Left eye exhibits no discharge. No scleral icterus.  Neck: Normal range of motion. Neck supple. No tracheal deviation present. No thyromegaly present.  Cardiovascular: Normal rate, regular rhythm and normal heart sounds.  Exam reveals no gallop and no friction rub.   No murmur heard. Pulmonary/Chest: Effort normal and breath sounds normal. No stridor. No respiratory distress. She has no wheezes. She has no rales.  Lymphadenopathy:    She has no cervical adenopathy.  Skin: Skin is warm and dry. She is not diaphoretic.  Vitals reviewed.       Assessment & Plan:     1. Recurrent acute suppurative otitis media of right ear without spontaneous rupture of tympanic membrane Recurrent otitis media of the right ear. It did respond to previous antibiotic treatment with amoxicillin but returned approximately 2 weeks following completion. I will treat with Augmentin to see if this offers a little better coverage and resolution. I did also advise for her to use Flonase. She needs to make sure to stay well-hydrated. She may take Tylenol and ibuprofen intermittently for fever and/or pain. She is to call the office if symptoms fail to improve. - amoxicillin-clavulanate (AUGMENTIN) 875-125 MG tablet; Take 1 tablet by mouth 2 (two) times daily.  Dispense: 20 tablet; Refill: 0       Mar Daring, PA-C  [facility]

## 2015-08-07 ENCOUNTER — Ambulatory Visit: Payer: 59 | Admitting: Physician Assistant

## 2015-08-14 ENCOUNTER — Encounter: Payer: Self-pay | Admitting: Physician Assistant

## 2015-08-14 ENCOUNTER — Ambulatory Visit (INDEPENDENT_AMBULATORY_CARE_PROVIDER_SITE_OTHER): Payer: 59 | Admitting: Physician Assistant

## 2015-08-14 VITALS — BP 110/80 | HR 98 | Temp 97.9°F | Resp 16 | Wt 203.2 lb

## 2015-08-14 DIAGNOSIS — B379 Candidiasis, unspecified: Secondary | ICD-10-CM | POA: Diagnosis not present

## 2015-08-14 DIAGNOSIS — K649 Unspecified hemorrhoids: Secondary | ICD-10-CM

## 2015-08-14 DIAGNOSIS — T3695XA Adverse effect of unspecified systemic antibiotic, initial encounter: Secondary | ICD-10-CM

## 2015-08-14 MED ORDER — HYDROCORTISONE ACETATE 25 MG RE SUPP: 25.0000 mg | Freq: Two times a day (BID) | RECTAL | Status: DC

## 2015-08-14 MED ORDER — NYSTATIN 100000 UNIT/GM EX CREA: 1.0000 "application " | TOPICAL_CREAM | Freq: Two times a day (BID) | CUTANEOUS | Status: DC

## 2015-08-14 MED ORDER — FLUCONAZOLE 150 MG PO TABS: 150.0000 mg | ORAL_TABLET | Freq: Once | ORAL | Status: DC

## 2015-08-14 NOTE — Progress Notes (Signed)
Patient: Laura [name] Female    DOB: September 07, 1985   30 y.o.   MRN: [id number] Visit Date: 08/14/2015  Today's Provider: Mar Daring, PA-C   Chief Complaint  Patient presents with  . Rash   Subjective:    HPI  Laura [name] is here today with c/o developing vaginal itchiness and discharge with the Augmentin, external burning in he vaginal area all the way back to her rectum. Feels like she might also have a hemorrhoids and feels a little discomfort when sitting.  Feels this is secondary to the diarrhea from the augmentin.      No Known Allergies Previous Medications   ACETAMINOPHEN (TYLENOL) 500 MG TABLET    Take 500 mg by mouth every 6 (six) hours as needed. Reported on 07/17/2015   ALBUTEROL (PROVENTIL HFA;VENTOLIN HFA) 108 (90 BASE) MCG/ACT INHALER    Inhale 2 puffs into the lungs every 6 (six) hours as needed for wheezing or shortness of breath.   ALPRAZOLAM (XANAX) 1 MG TABLET    Take 1 tablet (1 mg total) by mouth 2 (two) times daily as needed for anxiety.   AMOXICILLIN-CLAVULANATE (AUGMENTIN) 875-125 MG TABLET    Take 1 tablet by mouth 2 (two) times daily.   BIOTIN PO    Take by mouth 2 (two) times daily.   BUPROPION (WELLBUTRIN SR) 150 MG 12 HR TABLET    Take 1 tablet (150 mg total) by mouth 2 (two) times daily.   FLUCONAZOLE (DIFLUCAN) 150 MG TABLET    Take 1 tablet (150 mg total) by mouth once. May take 2nd pill 24-48 hours after if needed   MELATONIN 3 MG TABS    Take by mouth at bedtime. Reported on 08/05/2015   PSEUDOEPHEDRINE (SUDAFED) 30 MG TABLET    Take 30 mg by mouth every 4 (four) hours as needed for congestion.   SETLAKIN 0.15-0.03 MG TABLET    TAKE 1 TABLET BY MOUTH ONCE DAILY    Review of Systems  Constitutional: Negative.   Respiratory: Negative.   Cardiovascular: Negative.   Gastrointestinal: Negative.   Genitourinary: Positive for vaginal discharge. Negative for dysuria, urgency, frequency, flank pain, vaginal bleeding and vaginal  pain.  Musculoskeletal: Negative.   Skin: Positive for rash.    Social History  Substance Use Topics  . Smoking status: Current Every Day Smoker -- 0.50 packs/day  . Smokeless tobacco: Never Used  . Alcohol Use: 0.0 oz/week    0 Standard drinks or equivalent per week     Comment: occasionally; beer   Objective:   BP 110/80 mmHg  Pulse 98  Temp(Src) 97.9 F (36.6 C) (Oral)  Resp 16  Wt 203 lb 3.2 oz (92.171 kg)  Physical Exam  Constitutional: She appears well-developed and well-nourished. No distress.  Cardiovascular: Normal rate, regular rhythm and normal heart sounds.  Exam reveals no gallop and no friction rub.   No murmur heard. Pulmonary/Chest: Effort normal and breath sounds normal. No respiratory distress. She has no wheezes. She has no rales.  Genitourinary: Rectal exam shows external hemorrhoid (located 6 o'clock). There is no rash, tenderness or lesion on the right labia. There is no rash, tenderness or lesion on the left labia. There is erythema in the vagina. No tenderness in the vagina. Vaginal discharge (white discharge ) found.  Skin: She is not diaphoretic.  Vitals reviewed.       Assessment & Plan:     1. Hemorrhoids, unspecified hemorrhoid type Small  external hemorrhoid noted at 6:00 area of the anus. Will give Anusol as below. Advised to add fiber and/or stool softeners to bulk up stools but allow for easy passing. Advised to not strain. Advised to increase water intake. Also advised that she may do Epsom salts soaks once or twice daily for discomfort. May take Tylenol and/or ibuprofen as needed for discomfort as well. She is to call the office if symptoms fail to improve with treatment. - hydrocortisone (ANUSOL-HC) 25 MG suppository; Place 1 suppository (25 mg total) rectally 2 (two) times daily.  Dispense: 12 suppository; Refill: 0  2. Antibiotic-induced yeast infection Antibiotic-induced yeast infection from Augmentin. We'll give Diflucan as below. Also  will give nystatin cream for vaginal irritation and groin irritation. She is call the office if symptoms fail to improve or worsen. - fluconazole (DIFLUCAN) 150 MG tablet; Take 1 tablet (150 mg total) by mouth once. May take 2nd pill 24-48 hours after if needed  Dispense: 3 tablet; Refill: 0 - nystatin cream (MYCOSTATIN); Apply 1 application topically 2 (two) times daily.  Dispense: 30 g; Refill: 0       Mar Daring, PA-C  [facility]

## 2015-08-24 DIAGNOSIS — R197 Diarrhea, unspecified: Secondary | ICD-10-CM | POA: Diagnosis not present

## 2015-08-29 ENCOUNTER — Telehealth: Payer: Self-pay

## 2015-08-29 DIAGNOSIS — F419 Anxiety disorder, unspecified: Secondary | ICD-10-CM

## 2015-08-29 MED ORDER — ALPRAZOLAM 1 MG PO TABS: 1.0000 mg | ORAL_TABLET | Freq: Two times a day (BID) | ORAL | Status: DC | PRN

## 2015-08-29 NOTE — Telephone Encounter (Signed)
Will you please call in to [facility] Alprazolam 1mg  1 tab PO BID prn anxiety #60 NR Thanks

## 2015-08-29 NOTE — Telephone Encounter (Signed)
Patient is requesting refills for her Alprazolam 1 mg with refills. [facility] pharmacy. And she will establish new PCP.  Thanks,  -Kellsie Grindle

## 2015-08-29 NOTE — Telephone Encounter (Signed)
Prescription was called to [facility] pharmacy.  Thanks,  -Keia Rask

## 2015-10-23 ENCOUNTER — Encounter: Payer: Self-pay | Admitting: Obstetrics and Gynecology

## 2015-10-23 ENCOUNTER — Ambulatory Visit (INDEPENDENT_AMBULATORY_CARE_PROVIDER_SITE_OTHER): Payer: 59 | Admitting: Obstetrics and Gynecology

## 2015-10-23 VITALS — BP 112/77 | HR 120 | Ht 62.0 in | Wt 212.9 lb

## 2015-10-23 DIAGNOSIS — R309 Painful micturition, unspecified: Secondary | ICD-10-CM | POA: Diagnosis not present

## 2015-10-23 DIAGNOSIS — Z01419 Encounter for gynecological examination (general) (routine) without abnormal findings: Secondary | ICD-10-CM | POA: Diagnosis not present

## 2015-10-23 DIAGNOSIS — R102 Pelvic and perineal pain: Secondary | ICD-10-CM | POA: Diagnosis not present

## 2015-10-23 DIAGNOSIS — Z8742 Personal history of other diseases of the female genital tract: Secondary | ICD-10-CM

## 2015-10-23 DIAGNOSIS — N939 Abnormal uterine and vaginal bleeding, unspecified: Secondary | ICD-10-CM | POA: Insufficient documentation

## 2015-10-23 DIAGNOSIS — Z86018 Personal history of other benign neoplasm: Secondary | ICD-10-CM

## 2015-10-23 LAB — POCT URINALYSIS DIPSTICK
BILIRUBIN UA: NEGATIVE
Blood, UA: NEGATIVE
Glucose, UA: NEGATIVE
KETONES UA: NEGATIVE
LEUKOCYTES UA: NEGATIVE
NITRITE UA: NEGATIVE
PROTEIN UA: NEGATIVE
Spec Grav, UA: 1.02
Urobilinogen, UA: 0.2
pH, UA: 6

## 2015-10-23 NOTE — Progress Notes (Signed)
Patient ID: Laura [name], female   DOB: 02-25-1986, 30 y.o.   MRN: [id number] ANNUAL PREVENTATIVE CARE GYN  ENCOUNTER NOTE  Subjective:       BARABARA [name] is a 30 y.o. G65P2003 female here for a routine annual gynecologic exam.  Current complaints: 1. DISCUSS DIFFERENT PILLS- HAVING BTB, CLOTS, HEAVY CYCLES AND PAINFUL Heavy bleeding occurring almost every other week and has worsened over past 6 mos. Notices red clots with bleeding, also painful cramping that is worse on the left side. Not predictable, and cycles also irregular. 2. XANAX REFILL  - needs to discuss with primary care 3. PAINFUL IC  New issue. Pain with penetration as well as deep thrusting. No vaginal dryness experienced by patient. Patient reports trying to use lubricant and still having pain with intercourse. Pt also reports decreased libido.   Gynecologic History Patient's last menstrual period was 10/06/2015 (exact date). Contraception: tubal ligation Last Pap: 05/2014 . Results were: normal Last mammogram: N/A. Results were: N/A  Obstetric History OB History  Gravida Para Term Preterm AB SAB TAB Ectopic Multiple Living  2 2 2      1 3     # Outcome Date GA Lbr Len/2nd Weight Sex Delivery Anes PTL Lv  2A Gravida 2010   4 lb 2.1 oz (1.873 kg) F CS-LTranv   Y  2B Term 2010   4 lb 4.8 oz (1.95 kg) F CS-LTranv   Y  1 Term 2009   7 lb 14.4 oz (3.583 kg) M Vag-Spont   Y      Past Medical History  Diagnosis Date  . Depression   . Anxiety   . Increased BMI   . Tobacco user   . Heavy periods     Past Surgical History  Procedure Laterality Date  . Cesarean section  2010  . Tubal ligation  2011    Current Outpatient Prescriptions on File Prior to Visit  Medication Sig Dispense Refill  . acetaminophen (TYLENOL) 500 MG tablet Take 500 mg by mouth every 6 (six) hours as needed. Reported on 07/17/2015    . ALPRAZolam (XANAX) 1 MG tablet Take 1 tablet (1 mg total) by mouth 2 (two) times daily as needed for  anxiety. 60 tablet 0  . BIOTIN PO Take by mouth 2 (two) times daily.    . SETLAKIN 0.15-0.03 MG tablet TAKE 1 TABLET BY MOUTH ONCE DAILY 30 tablet 0   No current facility-administered medications on file prior to visit.    Allergies  Allergen Reactions  . Amoxicillin     Social History   Social History  . Marital Status: Married    Spouse Name: N/A  . Number of Children: N/A  . Years of Education: N/A   Occupational History  . Not on file.   Social History Main Topics  . Smoking status: Current Every Day Smoker -- 0.50 packs/day    Types: Cigarettes  . Smokeless tobacco: Never Used  . Alcohol Use: 0.0 oz/week    0 Standard drinks or equivalent per week     Comment: occasionally; beer  . Drug Use: No  . Sexual Activity: Yes    Birth Control/ Protection: Surgical   Other Topics Concern  . Not on file   Social History Narrative    Family History  Problem Relation Age of Onset  . Mental illness Mother   . Diabetes Maternal Grandmother   . Heart disease Maternal Grandfather   . Cancer Neg Hx  The following portions of the patient's history were reviewed and updated as appropriate: allergies, current medications, past family history, past medical history, past social history, past surgical history and problem list.  Review of Systems ROS Review of Systems - General ROS: negative for - chills, fatigue, fever, hot flashes, night sweats, weight gain or weight loss Psychological ROS: negative for - anxiety, decreased libido, depression, mood swings, physical abuse or sexual abuse Ophthalmic ROS: negative for - blurry vision, eye pain or loss of vision ENT ROS: negative for - headaches, hearing change, visual changes or vocal changes Allergy and Immunology ROS: negative for - hives, itchy/watery eyes or seasonal allergies Hematological and Lymphatic ROS: negative for - bleeding problems, bruising, swollen lymph nodes or weight loss Endocrine ROS: negative for -  galactorrhea, hair pattern changes, hot flashes, malaise/lethargy, mood swings, palpitations, polydipsia/polyuria, skin changes, temperature intolerance or unexpected weight changes Breast ROS: negative for - new or changing breast lumps or nipple discharge Respiratory ROS: negative for - cough or shortness of breath Cardiovascular ROS: negative for - chest pain, irregular heartbeat, palpitations or shortness of breath Gastrointestinal ROS: no abdominal pain, +pain with defecation, no black or bloody stools,  Genito-Urinary ROS: no dysuria, trouble voiding, or hematuria Musculoskeletal ROS: negative for - joint pain or joint stiffness Neurological ROS: negative for - bowel and bladder control changes Dermatological ROS: negative for rash and skin lesion changes   Objective:   BP 112/77 mmHg  Pulse 120  Ht 5\' 2"  (1.575 m)  Wt 212 lb 14.4 oz (96.571 kg)  BMI 38.93 kg/m2  LMP 10/06/2015 (Exact Date) CONSTITUTIONAL: Well-developed, obese female in no acute distress.  PSYCHIATRIC: Normal mood and affect. Normal behavior. Normal judgment and thought content. Fowler: Alert and oriented to person, place, and time. Normal muscle tone coordination. No cranial nerve deficit noted. HENT:  Normocephalic, atraumatic, External right and left ear normal. Oropharynx is clear and moist EYES: Conjunctivae and EOM are normal. Pupils are equal, round, and reactive to light. No scleral icterus.  NECK: Normal range of motion, supple, no masses.  Normal thyroid.  SKIN: Skin is warm and dry. No rash noted. Not diaphoretic. No erythema. No pallor. CARDIOVASCULAR: Normal heart rate noted, regular rhythm, no murmur. RESPIRATORY: Clear to auscultation bilaterally. Effort and breath sounds normal, no problems with respiration noted. BREASTS: Symmetric in size. No masses, skin changes, nipple drainage, or lymphadenopathy. ABDOMEN: Soft, normal bowel sounds, no distention noted.  No tenderness, rebound or guarding.  Pfannenstiel incision well-healed BLADDER: Normal PELVIC:  External Genitalia: Normal  BUS: Normal  Vagina: Normal  Cervix: Normal; minimal cervical motion tenderness; no lesions.  Uterus: Normal; top normal size, mobile, mildly tender  Adnexa: Normal; not palpable and nontender  RV: Not examined and External Exam NormaI  MUSCULOSKELETAL: Normal range of motion. No tenderness.  No cyanosis, clubbing, or edema.  2+ distal pulses. LYMPHATIC: No Axillary, Supraclavicular, or Inguinal Adenopathy.    Assessment:   Annual gynecologic examination 30 y.o. Contraception: tubal ligation BMI-38 Problem List Items Addressed This Visit    None    Visit Diagnoses    Well woman exam with routine gynecological exam    -  Primary    Relevant Orders    Pap IG, CT/NG w/ reflex HPV when ASC-U    Painful urination        Relevant Orders    POCT urinalysis dipstick (Completed)    Urine culture    Pelvic pain in female  Relevant Orders    US Transvaginal Non-OB    US Pelvis Complete    History of uterine fibroid        Relevant Orders    US Transvaginal Non-OB    US Pelvis Complete    Abnormal uterine bleeding (AUB)        Relevant Orders    US Transvaginal Non-OB    US Pelvis Complete       Plan:  Pap: Pap, Reflex if ASCUS Mammogram: Not Indicated Stool Guaiac Testing:  Not Indicated Labs: THRU PCP Routine preventative health maintenance measures emphasized: Exercise/Diet/Weight control, Tobacco Warnings and Alcohol/Substance use risks Pelvic ultrasound to assess uterine fibroids and possible etiologies of pelvic pain. Return in 2 weeks for follow-up on ultrasound and further management planning.  We'll discuss medical treatment versus hysterectomy Return to [facility] PA-S Brayton Mars, MD   I have seen, interviewed, and examined the patient in conjunction with the [facility]A. student and affirm the diagnosis and management plan.  Margreat Widener A. Raychelle Hudman, MD, FACOG   Note: This dictation was prepared with Dragon dictation along with smaller phrase technology. Any transcriptional errors that result from this process are unintentional.

## 2015-10-23 NOTE — Patient Instructions (Signed)
1. Pap smear 2. Self breast exam encouraged 3. Continue with healthy eating exercising and weight loss. Weight loss goal 1 pound per month 4. Pelvic ultrasound to assess uterine fibroids and abnormal uterine bleeding 5. Return in 2 weeks for follow-up on ultrasound and further management planning-medical therapy versus hysterectomy 6. Return in 1 year for annual exam

## 2015-10-25 LAB — URINE CULTURE: Organism ID, Bacteria: NO GROWTH

## 2015-10-26 LAB — PAP IG, CT-NG, RFX HPV ASCU
CHLAMYDIA, NUC. ACID AMP: NEGATIVE
Gonococcus by Nucleic Acid Amp: NEGATIVE
PAP SMEAR COMMENT: 0

## 2015-10-28 ENCOUNTER — Telehealth: Payer: Self-pay | Admitting: *Deleted

## 2015-10-28 NOTE — Telephone Encounter (Signed)
Patient called and was complaining of sharpe pain in her lower abdomen. Patient stated that she is having heavy bleeding with some clots. Patient pain is a 10 and is more intense today. Patient is coming in for a U/S on 10/30/15 and will see Dr Tennis Must on 11/06/15 for a follow up . Patient is requesting a call back . Call back number is [phone]

## 2015-10-28 NOTE — Telephone Encounter (Signed)
Pt states she has some spotting after pap smear last week. She started bleeding on Friday. Moderates bleeding on Sunday. Today she states its really heavy. Wearing a super plus tampon and wearing a pad. Changing about q 1.5h. Having left side pelvic pain. Took midol es and one advil. Pain is at an 8. Advised ibup 800q h and tylenol es q6. May use heating pad. Pt does have a u/s on Wed. May need to be seen after u/s if bleeding persists. Advised if bleeding gets heavier she needs to let us know.

## 2015-10-30 ENCOUNTER — Ambulatory Visit (INDEPENDENT_AMBULATORY_CARE_PROVIDER_SITE_OTHER): Payer: 59

## 2015-10-30 DIAGNOSIS — N939 Abnormal uterine and vaginal bleeding, unspecified: Secondary | ICD-10-CM | POA: Diagnosis not present

## 2015-10-30 DIAGNOSIS — R102 Pelvic and perineal pain: Secondary | ICD-10-CM | POA: Diagnosis not present

## 2015-10-30 DIAGNOSIS — Z8742 Personal history of other diseases of the female genital tract: Secondary | ICD-10-CM | POA: Diagnosis not present

## 2015-10-30 DIAGNOSIS — Z86018 Personal history of other benign neoplasm: Secondary | ICD-10-CM

## 2015-11-06 ENCOUNTER — Encounter: Payer: Self-pay | Admitting: Obstetrics and Gynecology

## 2015-11-06 ENCOUNTER — Ambulatory Visit (INDEPENDENT_AMBULATORY_CARE_PROVIDER_SITE_OTHER): Payer: 59 | Admitting: Obstetrics and Gynecology

## 2015-11-06 VITALS — BP 128/85 | HR 106 | Ht 62.0 in | Wt 216.3 lb

## 2015-11-06 DIAGNOSIS — Z8742 Personal history of other diseases of the female genital tract: Secondary | ICD-10-CM | POA: Diagnosis not present

## 2015-11-06 DIAGNOSIS — Z86018 Personal history of other benign neoplasm: Secondary | ICD-10-CM

## 2015-11-06 DIAGNOSIS — R102 Pelvic and perineal pain: Secondary | ICD-10-CM

## 2015-11-06 DIAGNOSIS — Z98891 History of uterine scar from previous surgery: Secondary | ICD-10-CM

## 2015-11-06 DIAGNOSIS — N939 Abnormal uterine and vaginal bleeding, unspecified: Secondary | ICD-10-CM | POA: Diagnosis not present

## 2015-11-06 NOTE — Patient Instructions (Signed)
1. LAVH bilateral salpingectomy will be scheduled-4 weeks 2. Return for preoperative appointment

## 2015-11-06 NOTE — Progress Notes (Signed)
Patient ID: Laura [name], female   DOB: 1986-01-30, 30 y.o.   MRN: [id number] GYN ENCOUNTER NOTE  Subjective:       Laura [name] is a 30 y.o. G55P2003 female is here for gynecologic evaluation of the following issues:  1. Follow up on [place] results. Discussed that patient has 3 fibroids all about an inch in size. Patient has frequent heavy bleeding with left sided cramping pain. Patient has been on birth control pills for years now with no relief of symptoms. Not interested in Depo provera due to risk of weight gain, and not interested in IUD due to past experience. Wants to proceed with hysterectomy. Concerns include whether she would need hormones after surgery and weight gain after surgery.   Gynecologic History Patient's last menstrual period was 10/25/2015. Contraception: tubal ligation Last Pap: 10/23/15. Results were: normal Last mammogram: NA. Results were: NA  Obstetric History OB History  Gravida Para Term Preterm AB SAB TAB Ectopic Multiple Living  2 2 2      1 3     # Outcome Date GA Lbr Len/2nd Weight Sex Delivery Anes PTL Lv  2A Gravida 2010   4 lb 2.1 oz (1.873 kg) F CS-LTranv   Y  2B Term 2010   4 lb 4.8 oz (1.95 kg) F CS-LTranv   Y  1 Term 2009   7 lb 14.4 oz (3.583 kg) M Vag-Spont   Y      Past Medical History  Diagnosis Date  . Depression   . Anxiety   . Increased BMI   . Tobacco user   . Heavy periods     Past Surgical History  Procedure Laterality Date  . Cesarean section  2010  . Tubal ligation  2011    Current Outpatient Prescriptions on File Prior to Visit  Medication Sig Dispense Refill  . acetaminophen (TYLENOL) 500 MG tablet Take 500 mg by mouth every 6 (six) hours as needed. Reported on 07/17/2015    . ALPRAZolam (XANAX) 1 MG tablet Take 1 tablet (1 mg total) by mouth 2 (two) times daily as needed for anxiety. 60 tablet 0  . BIOTIN PO Take by mouth 2 (two) times daily.     No current facility-administered medications on file prior to  visit.    Allergies  Allergen Reactions  . Amoxicillin     Social History   Social History  . Marital Status: Married    Spouse Name: N/A  . Number of Children: N/A  . Years of Education: N/A   Occupational History  . Not on file.   Social History Main Topics  . Smoking status: Current Every Day Smoker -- 0.50 packs/day    Types: Cigarettes  . Smokeless tobacco: Never Used  . Alcohol Use: 0.0 oz/week    0 Standard drinks or equivalent per week     Comment: occasionally; beer  . Drug Use: No  . Sexual Activity: Yes    Birth Control/ Protection: Surgical   Other Topics Concern  . Not on file   Social History Narrative    Family History  Problem Relation Age of Onset  . Mental illness Mother   . Diabetes Maternal Grandmother   . Heart disease Maternal Grandfather   . Cancer Neg Hx     The following portions of the patient's history were reviewed and updated as appropriate: allergies, current medications, past family history, past medical history, past social history, past surgical history and problem list.  Review of  Systems Review of Systems - Negative except as reported in HPI   Objective:   BP 128/85 mmHg  Pulse 106  Ht 5\' 2"  (1.575 m)  Wt 216 lb 4.8 oz (98.113 kg)  BMI 39.55 kg/m2  LMP 10/25/2015 CONSTITUTIONAL: Well-developed, well-nourished female in no acute distress.  HENT:  Normocephalic, atraumatic.  NECK: Normal range of motion, supple, no masses.  Normal thyroid.  SKIN: Skin is warm and dry. No rash noted. Not diaphoretic. No erythema. No pallor. [place]: Alert and oriented to person, place, and time.  PSYCHIATRIC: Normal mood and affect. Normal behavior. Normal judgment and thought content. CARDIOVASCULAR:Not Examined RESPIRATORY: Not Examined BREASTS: Not Examined ABDOMEN: Not Examined. PELVIC: Good vaginal vault support. Uterus midplane, top normal size, mobile, not enlarged. MUSCULOSKELETAL: Not Examined.  [place]: Indications: Pelvic  pain, left side. AUB. History of fibroids Findings:  The uterus measures 8.6 x 6.0 x 6.5 cm. Echo texture is heterogenous with evidence of focal masses. Within the uterus are at least 4 suspected fibroids. The largest 3 were measured. Fibroid 1: Anterior fundal. Appears intramural, measuring: 1.5 x 1.5 x 2.2 cm.  Fibroid 2: Posterior fundal with calcifications, pedunculated, measuring: 1.8 x 1.9 x 2.2 cm.  Fibroid 3: Posterior fundal, appears subserosal, measuring: 2.4 x 2.2 x 2.6 cm. These all appear to be slightly larger than on prior ultrasound of 2014.   The Endometrium measures 5.2 mm.  Right Ovary measures 3.1 x 2.5 x 2.2 cm, and appears WNL. Left Ovary measures 2.4 x 1.5 x 2.1 cm, and appears WNL with good vascular flow seen. Survey of the adnexa demonstrates no adnexal masses. There is no free fluid in the cul de sac.  Assessment:   1. History of uterine fibroids, slightly enlarged from prior scan  2. Abnormal uterine bleeding (AUB) - US shows 3 fibroids, 1 submucosal  3. Pelvic pain in female, left sided     Plan:   1. Hysterectomy to treat abnormal uterine bleeding related to fibroids. Good candidate for LAVH with bilateral salpingectomy. +/- left oophrectomy depending on how healthy left ovary appears. 2. Schedule surgery and see 4 days prior for pre-op H&P. 3. Defer endometrial biopsy at this time  A total of 15 minutes were spent face-to-face with the patient during this encounter and over half of that time dealt with counseling and coordination of care.    Claiborne Billings Rayburn PA-S Brayton Mars, MD   I have seen, interviewed, and examined the patient in conjunction with the [facility].A. student and affirm the diagnosis and management plan. Martin A. DeFrancesco, MD, Cherlynn June  \ Note: This dictation was prepared with Dragon dictation along with smaller phrase technology. Any transcriptional errors that result from this process are unintentional.

## 2015-11-18 ENCOUNTER — Encounter: Payer: Self-pay | Admitting: Obstetrics and Gynecology

## 2015-11-26 ENCOUNTER — Other Ambulatory Visit: Payer: Self-pay | Admitting: Physician Assistant

## 2015-11-27 ENCOUNTER — Telehealth: Payer: Self-pay | Admitting: Obstetrics and Gynecology

## 2015-11-27 DIAGNOSIS — Z0289 Encounter for other administrative examinations: Secondary | ICD-10-CM

## 2015-11-27 NOTE — Telephone Encounter (Signed)
Pt came by to make a payment but Shirlean Mylar was at lunch and I can't take funds here. She's going to call back today when Shirlean Mylar returns from lunch. She's wondering if the paperwork can be filled out and faxed today because if it isn't, she'll have to start the process over again per her employer. She'd like a phone call if this can't be arranged.  Thank you.

## 2015-11-28 NOTE — Telephone Encounter (Signed)
Pt aware per vm- fmla paperwork completed and faxed with confirmation. Filed.

## 2015-12-03 ENCOUNTER — Encounter: Payer: Self-pay | Admitting: Obstetrics and Gynecology

## 2015-12-04 ENCOUNTER — Encounter: Payer: Self-pay | Admitting: Obstetrics and Gynecology

## 2015-12-04 ENCOUNTER — Ambulatory Visit (INDEPENDENT_AMBULATORY_CARE_PROVIDER_SITE_OTHER): Payer: 59 | Admitting: Obstetrics and Gynecology

## 2015-12-04 ENCOUNTER — Encounter: Payer: Self-pay | Admitting: *Deleted

## 2015-12-04 VITALS — BP 100/68 | HR 105 | Ht 62.0 in | Wt 218.1 lb

## 2015-12-04 DIAGNOSIS — R102 Pelvic and perineal pain: Secondary | ICD-10-CM

## 2015-12-04 DIAGNOSIS — Z86018 Personal history of other benign neoplasm: Secondary | ICD-10-CM

## 2015-12-04 DIAGNOSIS — Z98891 History of uterine scar from previous surgery: Secondary | ICD-10-CM

## 2015-12-04 DIAGNOSIS — N939 Abnormal uterine and vaginal bleeding, unspecified: Secondary | ICD-10-CM

## 2015-12-04 DIAGNOSIS — Z01818 Encounter for other preprocedural examination: Secondary | ICD-10-CM

## 2015-12-04 DIAGNOSIS — Z8742 Personal history of other diseases of the female genital tract: Secondary | ICD-10-CM

## 2015-12-04 DIAGNOSIS — Z01812 Encounter for preprocedural laboratory examination: Secondary | ICD-10-CM | POA: Diagnosis not present

## 2015-12-04 LAB — CBC WITH DIFFERENTIAL/PLATELET
BASOS ABS: 0.1 10*3/uL (ref 0–0.1)
Basophils Relative: 1 %
EOS ABS: 0.1 10*3/uL (ref 0–0.7)
EOS PCT: 2 %
HCT: 41.4 % (ref 35.0–47.0)
Hemoglobin: 13.7 g/dL (ref 12.0–16.0)
LYMPHS ABS: 2.2 10*3/uL (ref 1.0–3.6)
Lymphocytes Relative: 33 %
MCH: 29.5 pg (ref 26.0–34.0)
MCHC: 33.1 g/dL (ref 32.0–36.0)
MCV: 88.9 fL (ref 80.0–100.0)
Monocytes Absolute: 0.4 10*3/uL (ref 0.2–0.9)
Monocytes Relative: 6 %
Neutro Abs: 3.9 10*3/uL (ref 1.4–6.5)
Neutrophils Relative %: 58 %
PLATELETS: 299 10*3/uL (ref 150–440)
RBC: 4.66 MIL/uL (ref 3.80–5.20)
RDW: 13.5 % (ref 11.5–14.5)
WBC: 6.8 10*3/uL (ref 3.6–11.0)

## 2015-12-04 LAB — BASIC METABOLIC PANEL
Anion gap: 6 (ref 5–15)
BUN: 12 mg/dL (ref 6–20)
CALCIUM: 9.4 mg/dL (ref 8.9–10.3)
CO2: 27 mmol/L (ref 22–32)
CREATININE: 0.71 mg/dL (ref 0.44–1.00)
Chloride: 103 mmol/L (ref 101–111)
GFR calc Af Amer: >60 mL/min (ref >60)
GFR calc non Af Amer: >60 mL/min (ref >60)
GLUCOSE: 79 mg/dL (ref 65–99)
Potassium: 3.7 mmol/L (ref 3.5–5.1)
Sodium: 136 mmol/L (ref 135–145)

## 2015-12-04 LAB — RAPID HIV SCREEN (HIV 1/2 AB+AG)
HIV 1/2 Antibodies: NONREACTIVE
HIV-1 P24 ANTIGEN - HIV24: NONREACTIVE

## 2015-12-04 NOTE — Patient Instructions (Signed)
  Your procedure is scheduled on: 12/16/15 Mon Report to Same Day Surgery 2nd floor medical mall To find out your arrival time please call [phone] between 1PM - 3PM on 12/12/17  Remember: Instructions that are not followed completely may result in serious medical risk, up to and including death, or upon the discretion of your surgeon and anesthesiologist your surgery may need to be rescheduled.    _x___ 1. Do not eat food or drink liquids after midnight. No gum chewing or hard candies.     __x__ 2. No Alcohol for 24 hours before or after surgery.   ____ 3. Bring all medications with you on the day of surgery if instructed.    __x__ 4. Notify your doctor if there is any change in your medical condition     (cold, fever, infections).     Do not wear jewelry, make-up, hairpins, clips or nail polish.  Do not wear lotions, powders, or perfumes. You may wear deodorant.  Do not shave 48 hours prior to surgery. Men may shave face and neck.  Do not bring valuables to the hospital.    [facility] is not responsible for any belongings or valuables.               Contacts, dentures or bridgework may not be worn into surgery.  Leave your suitcase in the car. After surgery it may be brought to your room.  For patients admitted to the hospital, discharge time is determined by your treatment team.   Patients discharged the day of surgery will not be allowed to drive home.    Please read over the following fact sheets that you were given:   [facility] Preparing for Surgery and or MRSA Information   _x___ Take these medicines the morning of surgery with A SIP OF WATER:    1. None  2.  3.  4.  5.  6.  ____ Fleet Enema (as directed)   _x___ Use CHG Soap or sage wipes as directed on instruction sheet   ____ Use inhalers on the day of surgery and bring to hospital day of surgery  ____ Stop metformin 2 days prior to surgery    ____ Take 1/2 of usual insulin dose the night before surgery  and none on the morning of           surgery.   ____ Stop aspirin or coumadin, or plavix  _x__ Stop Anti-inflammatories such as Advil, Aleve, Ibuprofen, Motrin, Naproxen,          Naprosyn, Goodies powders or aspirin products. Ok to take Tylenol.   ____ Stop supplements until after surgery.    ____ Bring C-Pap to the hospital.

## 2015-12-04 NOTE — Progress Notes (Signed)
Subjective:  PREOPERATIVE HISTORY AND PHYSICAL     Patient is a 30 y.o. G2P207female scheduled for    LAVH bilateral salpingectomy For uterine fibroids and pelvic pain. Ultrasound demonstrates for uterine fibroids. Pelvic pain is left-sided.  Birth control pills did not control her abnormal uterine bleeding she was having bleeding almost every other week in the past 6 months along with associated painful cramping in the midline as well as left lower quadrant. She is also experiencing deep thrusting dyspareunia  [place]: Indications: Pelvic pain, left side. AUB. History of fibroids Findings:  The uterus measures 8.6 x 6.0 x 6.5 cm. Echo texture is heterogenous with evidence of focal masses. Within the uterus are at least 4 suspected fibroids. The largest 3 were measured. Fibroid 1: Anterior fundal. Appears intramural, measuring: 1.5 x 1.5 x 2.2 cm.  Fibroid 2: Posterior fundal with calcifications, pedunculated, measuring: 1.8 x 1.9 x 2.2 cm.  Fibroid 3: Posterior fundal, appears subserosal, measuring: 2.4 x 2.2 x 2.6 cm. These all appear to be slightly larger than on prior ultrasound of 2014.   The Endometrium measures 5.2 mm.  Right Ovary measures 3.1 x 2.5 x 2.2 cm, and appears WNL. Left Ovary measures 2.4 x 1.5 x 2.1 cm, and appears WNL with good vascular flow seen. Survey of the adnexa demonstrates no adnexal masses. There is no free fluid in the cul de sac.    Pertinent Gynecological History: Patient's last menstrual period was 10/06/2015 (exact date). Contraception: tubal ligation Last Pap: 05/2014 . Results were: normal Last mammogram: N/A. Results were: N/A  Discussed Blood/Blood Products: yes   Menstrual History: OB History    Gravida Para Term Preterm AB TAB SAB Ectopic Multiple Living   2 2 2      1 3       Menarche age: NA Patient's last menstrual period was 11/22/2015 (approximate).    Past Medical History  Diagnosis Date  . Depression   . Anxiety   .  Increased BMI   . Tobacco user   . Heavy periods     Past Surgical History  Procedure Laterality Date  . Cesarean section  2010  . Tubal ligation  2011    OB History  Gravida Para Term Preterm AB SAB TAB Ectopic Multiple Living  2 2 2      1 3     # Outcome Date GA Lbr Len/2nd Weight Sex Delivery Anes PTL Lv  2A Gravida 2010   4 lb 2.1 oz (1.873 kg) F CS-LTranv   Y  2B Term 2010   4 lb 4.8 oz (1.95 kg) F CS-LTranv   Y  1 Term 2009   7 lb 14.4 oz (3.583 kg) M Vag-Spont   Y      Social History   Social History  . Marital Status: Married    Spouse Name: N/A  . Number of Children: N/A  . Years of Education: N/A   Social History Main Topics  . Smoking status: Current Every Day Smoker -- 0.50 packs/day    Types: Cigarettes  . Smokeless tobacco: Never Used  . Alcohol Use: 0.0 oz/week    0 Standard drinks or equivalent per week     Comment: occasionally; beer  . Drug Use: No  . Sexual Activity: Yes    Birth Control/ Protection: Surgical   Other Topics Concern  . None   Social History Narrative    Family History  Problem Relation Age of Onset  . Mental illness Mother   .  Diabetes Maternal Grandmother   . Heart disease Maternal Grandfather   . Cancer Neg Hx      (Not in a hospital admission)  Allergies  Allergen Reactions  . Amoxicillin     Review of Systems Constitutional: No recent fever/chills/sweats Respiratory: No recent cough/bronchitis Cardiovascular: No chest pain Gastrointestinal: No recent nausea/vomiting/diarrhea Genitourinary: No UTI symptoms Hematologic/lymphatic:No history of coagulopathy or recent blood thinner use    Objective:    BP 100/68 mmHg  Pulse 105  Ht 5\' 2"  (1.575 m)  Wt 218 lb 1.6 oz (98.93 kg)  BMI 39.88 kg/m2  LMP 11/22/2015 (Approximate)  General:   Normal  Skin:   normal  HEENT:  Normal  Neck:  Supple without Adenopathy or Thyromegaly  Lungs:   Heart:              Breasts:   Abdomen:  Pelvis:  M/S    Extremeties:  Neuro:    clear to auscultation bilaterally   Normal without murmur   Not Examined   soft, non-tender; bowel sounds normal; no masses,  no organomegaly   Exam deferred to OR  No CVAT  Warm/Dry   Normal       10/23/2015 PELVIC: External Genitalia: Normal BUS: Normal Vagina: Normal Cervix: Normal; minimal cervical motion tenderness; no lesions. Uterus: Normal; top normal size, mobile, mildly tender Adnexa: Normal; not palpable and nontender RV: Not examined and External Exam NormaI    Assessment:    1. Symptomatic fibroid uterus 2. Chronic pelvic pain midline and left lower quadrant 3. Abnormal uterine bleeding 4. Status post cesarean section and BTL.   Plan:  LAVH bilateral salpingectomy   Preoperative counseling: The patient is to undergo LAVH bilateral salpingectomy for symptomatic fibroid uterus, abnormal uterine bleeding refractory to medical therapy, and chronic pelvic pain in the midline and left lower quadrant along with deep thrusting dyspareunia. She is understanding of the planned procedure and is aware of and is accepting of all surgical risks which include but are not limited to bleeding, infection, pelvic organ injury with need for repair, blood clot disorders, anesthesia risks, etc. All questions have been answered. Informed consent is given. Patient is ready and willing to proceed with surgery as scheduled.  Brayton Mars, MD  Note: This dictation was prepared with Dragon dictation along with smaller phrase technology. Any transcriptional errors that result from this process are unintentional.

## 2015-12-04 NOTE — Pre-Procedure Instructions (Signed)
Woke up during surgery when having a BTL.  Request sedative before going into surgery.

## 2015-12-04 NOTE — Patient Instructions (Signed)
1. Postop check following surgery-1 week

## 2015-12-04 NOTE — H&P (Signed)
Subjective:  PREOPERATIVE HISTORY AND PHYSICAL     Patient is a 30 y.o. G2P2026female scheduled for    LAVH bilateral salpingectomy For uterine fibroids and pelvic pain. Ultrasound demonstrates for uterine fibroids. Pelvic pain is left-sided.  Birth control pills did not control her abnormal uterine bleeding she was having bleeding almost every other week in the past 6 months along with associated painful cramping in the midline as well as left lower quadrant. She is also experiencing deep thrusting dyspareunia  [place]: Indications: Pelvic pain, left side. AUB. History of fibroids Findings:  The uterus measures 8.6 x 6.0 x 6.5 cm. Echo texture is heterogenous with evidence of focal masses. Within the uterus are at least 4 suspected fibroids. The largest 3 were measured. Fibroid 1: Anterior fundal. Appears intramural, measuring: 1.5 x 1.5 x 2.2 cm.  Fibroid 2: Posterior fundal with calcifications, pedunculated, measuring: 1.8 x 1.9 x 2.2 cm.  Fibroid 3: Posterior fundal, appears subserosal, measuring: 2.4 x 2.2 x 2.6 cm. These all appear to be slightly larger than on prior ultrasound of 2014.   The Endometrium measures 5.2 mm.  Right Ovary measures 3.1 x 2.5 x 2.2 cm, and appears WNL. Left Ovary measures 2.4 x 1.5 x 2.1 cm, and appears WNL with good vascular flow seen. Survey of the adnexa demonstrates no adnexal masses. There is no free fluid in the cul de sac.    Pertinent Gynecological History: Patient's last menstrual period was 10/06/2015 (exact date). Contraception: tubal ligation Last Pap: 05/2014 . Results were: normal Last mammogram: N/A. Results were: N/A  Discussed Blood/Blood Products: yes   Menstrual History: OB History    Gravida Para Term Preterm AB TAB SAB Ectopic Multiple Living   2 2 2      1 3       Menarche age: NA Patient's last menstrual period was 11/22/2015 (approximate).    Past Medical History  Diagnosis Date  . Depression   . Anxiety   .  Increased BMI   . Tobacco user   . Heavy periods     Past Surgical History  Procedure Laterality Date  . Cesarean section  2010  . Tubal ligation  2011    OB History  Gravida Para Term Preterm AB SAB TAB Ectopic Multiple Living  2 2 2      1 3     # Outcome Date GA Lbr Len/2nd Weight Sex Delivery Anes PTL Lv  2A Gravida 2010   4 lb 2.1 oz (1.873 kg) F CS-LTranv   Y  2B Term 2010   4 lb 4.8 oz (1.95 kg) F CS-LTranv   Y  1 Term 2009   7 lb 14.4 oz (3.583 kg) M Vag-Spont   Y      Social History   Social History  . Marital Status: Married    Spouse Name: N/A  . Number of Children: N/A  . Years of Education: N/A   Social History Main Topics  . Smoking status: Current Every Day Smoker -- 0.50 packs/day    Types: Cigarettes  . Smokeless tobacco: Never Used  . Alcohol Use: 0.0 oz/week    0 Standard drinks or equivalent per week     Comment: occasionally; beer  . Drug Use: No  . Sexual Activity: Yes    Birth Control/ Protection: Surgical   Other Topics Concern  . None   Social History Narrative    Family History  Problem Relation Age of Onset  . Mental illness Mother   .  Diabetes Maternal Grandmother   . Heart disease Maternal Grandfather   . Cancer Neg Hx      (Not in a hospital admission)  Allergies  Allergen Reactions  . Amoxicillin     Review of Systems Constitutional: No recent fever/chills/sweats Respiratory: No recent cough/bronchitis Cardiovascular: No chest pain Gastrointestinal: No recent nausea/vomiting/diarrhea Genitourinary: No UTI symptoms Hematologic/lymphatic:No history of coagulopathy or recent blood thinner use    Objective:    BP 100/68 mmHg  Pulse 105  Ht 5\' 2"  (1.575 m)  Wt 218 lb 1.6 oz (98.93 kg)  BMI 39.88 kg/m2  LMP 11/22/2015 (Approximate)  General:   Normal  Skin:   normal  HEENT:  Normal  Neck:  Supple without Adenopathy or Thyromegaly  Lungs:   Heart:              Breasts:   Abdomen:  Pelvis:  M/S    Extremeties:  Neuro:    clear to auscultation bilaterally   Normal without murmur   Not Examined   soft, non-tender; bowel sounds normal; no masses,  no organomegaly   Exam deferred to OR  No CVAT  Warm/Dry   Normal       10/23/2015 PELVIC: External Genitalia: Normal BUS: Normal Vagina: Normal Cervix: Normal; minimal cervical motion tenderness; no lesions. Uterus: Normal; top normal size, mobile, mildly tender Adnexa: Normal; not palpable and nontender RV: Not examined and External Exam NormaI    Assessment:    1. Symptomatic fibroid uterus 2. Chronic pelvic pain midline and left lower quadrant 3. Abnormal uterine bleeding 4. Status post cesarean section and BTL.   Plan:  LAVH bilateral salpingectomy   Preoperative counseling: The patient is to undergo LAVH bilateral salpingectomy for symptomatic fibroid uterus, abnormal uterine bleeding refractory to medical therapy, and chronic pelvic pain in the midline and left lower quadrant along with deep thrusting dyspareunia. She is understanding of the planned procedure and is aware of and is accepting of all surgical risks which include but are not limited to bleeding, infection, pelvic organ injury with need for repair, blood clot disorders, anesthesia risks, etc. All questions have been answered. Informed consent is given. Patient is ready and willing to proceed with surgery as scheduled.  Brayton Mars, MD  Note: This dictation was prepared with Dragon dictation along with smaller phrase technology. Any transcriptional errors that result from this process are unintentional.

## 2015-12-04 NOTE — Addendum Note (Signed)
Addended by: Malachi Paradise on: 12/04/2015 02:58 PM   Modules accepted: Orders

## 2015-12-05 ENCOUNTER — Encounter
Admission: RE | Admit: 2015-12-05 | Discharge: 2015-12-05 | Disposition: A | Discharge status: Home / Self Care | Payer: 59 | Source: Ambulatory Visit | Attending: Obstetrics and Gynecology | Admitting: Obstetrics and Gynecology

## 2015-12-05 DIAGNOSIS — Z01812 Encounter for preprocedural laboratory examination: Secondary | ICD-10-CM | POA: Insufficient documentation

## 2015-12-05 LAB — TYPE AND SCREEN
ABO/RH(D): A POS
Antibody Screen: NEGATIVE

## 2015-12-05 LAB — RPR: RPR Ser Ql: NONREACTIVE

## 2015-12-16 ENCOUNTER — Encounter: Payer: Self-pay | Admitting: *Deleted

## 2015-12-16 ENCOUNTER — Ambulatory Visit: Payer: 59 | Admitting: Anesthesiology

## 2015-12-16 ENCOUNTER — Observation Stay
Admission: RE | Admit: 2015-12-16 | Discharge: 2015-12-17 | Disposition: A | Discharge status: Home / Self Care | Payer: 59 | Source: Ambulatory Visit | Attending: Obstetrics and Gynecology | Admitting: Obstetrics and Gynecology

## 2015-12-16 ENCOUNTER — Encounter
Admission: RE | Disposition: A | Discharge status: Home / Self Care | Payer: Self-pay | Source: Ambulatory Visit | Attending: Obstetrics and Gynecology

## 2015-12-16 DIAGNOSIS — G8929 Other chronic pain: Secondary | ICD-10-CM | POA: Insufficient documentation

## 2015-12-16 DIAGNOSIS — N838 Other noninflammatory disorders of ovary, fallopian tube and broad ligament: Secondary | ICD-10-CM | POA: Diagnosis not present

## 2015-12-16 DIAGNOSIS — Z8249 Family history of ischemic heart disease and other diseases of the circulatory system: Secondary | ICD-10-CM | POA: Diagnosis not present

## 2015-12-16 DIAGNOSIS — R102 Pelvic and perineal pain: Secondary | ICD-10-CM | POA: Diagnosis not present

## 2015-12-16 DIAGNOSIS — Z8742 Personal history of other diseases of the female genital tract: Secondary | ICD-10-CM | POA: Diagnosis not present

## 2015-12-16 DIAGNOSIS — K219 Gastro-esophageal reflux disease without esophagitis: Secondary | ICD-10-CM | POA: Diagnosis not present

## 2015-12-16 DIAGNOSIS — D219 Benign neoplasm of connective and other soft tissue, unspecified: Secondary | ICD-10-CM | POA: Diagnosis present

## 2015-12-16 DIAGNOSIS — F329 Major depressive disorder, single episode, unspecified: Secondary | ICD-10-CM | POA: Insufficient documentation

## 2015-12-16 DIAGNOSIS — Q504 Embryonic cyst of fallopian tube: Secondary | ICD-10-CM | POA: Diagnosis not present

## 2015-12-16 DIAGNOSIS — D259 Leiomyoma of uterus, unspecified: Secondary | ICD-10-CM | POA: Insufficient documentation

## 2015-12-16 DIAGNOSIS — N72 Inflammatory disease of cervix uteri: Secondary | ICD-10-CM | POA: Diagnosis not present

## 2015-12-16 DIAGNOSIS — F17210 Nicotine dependence, cigarettes, uncomplicated: Secondary | ICD-10-CM | POA: Insufficient documentation

## 2015-12-16 DIAGNOSIS — Z818 Family history of other mental and behavioral disorders: Secondary | ICD-10-CM | POA: Diagnosis not present

## 2015-12-16 DIAGNOSIS — N889 Noninflammatory disorder of cervix uteri, unspecified: Secondary | ICD-10-CM | POA: Diagnosis not present

## 2015-12-16 DIAGNOSIS — Z880 Allergy status to penicillin: Secondary | ICD-10-CM | POA: Diagnosis not present

## 2015-12-16 DIAGNOSIS — N939 Abnormal uterine and vaginal bleeding, unspecified: Secondary | ICD-10-CM | POA: Diagnosis not present

## 2015-12-16 DIAGNOSIS — Z833 Family history of diabetes mellitus: Secondary | ICD-10-CM | POA: Diagnosis not present

## 2015-12-16 DIAGNOSIS — N938 Other specified abnormal uterine and vaginal bleeding: Secondary | ICD-10-CM | POA: Diagnosis present

## 2015-12-16 DIAGNOSIS — N888 Other specified noninflammatory disorders of cervix uteri: Secondary | ICD-10-CM | POA: Diagnosis not present

## 2015-12-16 HISTORY — DX: Other complications of anesthesia, initial encounter: T88.59XA

## 2015-12-16 HISTORY — PX: LAPAROSCOPIC VAGINAL HYSTERECTOMY WITH SALPINGECTOMY: SHX6680

## 2015-12-16 HISTORY — DX: Unspecified urinary incontinence: R32

## 2015-12-16 HISTORY — DX: Functional dyspepsia: K30

## 2015-12-16 HISTORY — DX: Adverse effect of unspecified anesthetic, initial encounter: T41.45XA

## 2015-12-16 LAB — POCT PREGNANCY, URINE: PREG TEST UR: NEGATIVE

## 2015-12-16 LAB — HEMOGLOBIN: Hemoglobin: 13.1 g/dL (ref 12.0–16.0)

## 2015-12-16 SURGERY — HYSTERECTOMY, VAGINAL, LAPAROSCOPY-ASSISTED, WITH SALPINGECTOMY
Anesthesia: General | Laterality: Bilateral

## 2015-12-16 MED ORDER — ONDANSETRON HCL 4 MG/2ML IJ SOLN
INTRAMUSCULAR | Status: DC | PRN
  Administered 2015-12-16 (×2): 4 mg via INTRAVENOUS

## 2015-12-16 MED ORDER — FAMOTIDINE 20 MG PO TABS
20.0000 mg | ORAL_TABLET | Freq: Once | ORAL | Status: AC
  Administered 2015-12-16: 20 mg via ORAL

## 2015-12-16 MED ORDER — ROCURONIUM BROMIDE 100 MG/10ML IV SOLN
INTRAVENOUS | Status: DC | PRN
  Administered 2015-12-16: 50 mg via INTRAVENOUS
  Administered 2015-12-16 (×3): 10 mg via INTRAVENOUS

## 2015-12-16 MED ORDER — MIDAZOLAM HCL 2 MG/2ML IJ SOLN
INTRAMUSCULAR | Status: AC
  Administered 2015-12-16: 2 mg via INTRAVENOUS
  Filled 2015-12-16: qty 2

## 2015-12-16 MED ORDER — MIDAZOLAM HCL 2 MG/2ML IJ SOLN
INTRAMUSCULAR | Status: DC | PRN
  Administered 2015-12-16: 2 mg via INTRAVENOUS

## 2015-12-16 MED ORDER — ACETAMINOPHEN 325 MG PO TABS: 650.0000 mg | ORAL_TABLET | ORAL | Status: DC | PRN

## 2015-12-16 MED ORDER — ONDANSETRON HCL 4 MG PO TABS: 4.0000 mg | ORAL_TABLET | Freq: Four times a day (QID) | ORAL | Status: DC | PRN

## 2015-12-16 MED ORDER — HYDROMORPHONE HCL 1 MG/ML IJ SOLN
0.2500 mg | INTRAMUSCULAR | Status: AC | PRN
  Administered 2015-12-16 (×8): 0.25 mg via INTRAVENOUS

## 2015-12-16 MED ORDER — HYDROMORPHONE HCL 1 MG/ML IJ SOLN
INTRAMUSCULAR | Status: AC
  Administered 2015-12-16: 0.25 mg via INTRAVENOUS
  Filled 2015-12-16: qty 1

## 2015-12-16 MED ORDER — NEOSTIGMINE METHYLSULFATE 10 MG/10ML IV SOLN
INTRAVENOUS | Status: DC | PRN
  Administered 2015-12-16: 4 mg via INTRAVENOUS

## 2015-12-16 MED ORDER — ALPRAZOLAM 1 MG PO TABS
1.0000 mg | ORAL_TABLET | Freq: Two times a day (BID) | ORAL | Status: DC
  Administered 2015-12-16 – 2015-12-17 (×2): 1 mg via ORAL
  Filled 2015-12-16 (×2): qty 1

## 2015-12-16 MED ORDER — MORPHINE SULFATE (PF) 2 MG/ML IV SOLN
1.0000 mg | INTRAVENOUS | Status: DC | PRN
  Administered 2015-12-16 – 2015-12-17 (×4): 2 mg via INTRAVENOUS
  Filled 2015-12-16 (×4): qty 1

## 2015-12-16 MED ORDER — CLINDAMYCIN PHOSPHATE 900 MG/50ML IV SOLN
900.0000 mg | INTRAVENOUS | Status: AC
  Administered 2015-12-16: 900 mg via INTRAVENOUS

## 2015-12-16 MED ORDER — MIDAZOLAM HCL 2 MG/2ML IJ SOLN
2.0000 mg | Freq: Once | INTRAMUSCULAR | Status: AC
  Administered 2015-12-16: 2 mg via INTRAVENOUS

## 2015-12-16 MED ORDER — OXYCODONE-ACETAMINOPHEN 5-325 MG PO TABS
1.0000 | ORAL_TABLET | ORAL | Status: DC | PRN
  Administered 2015-12-16 (×2): 2 via ORAL
  Administered 2015-12-17: 1 via ORAL
  Administered 2015-12-17: 2 via ORAL
  Filled 2015-12-16 (×4): qty 2

## 2015-12-16 MED ORDER — LIDOCAINE HCL (CARDIAC) 20 MG/ML IV SOLN
INTRAVENOUS | Status: DC | PRN
  Administered 2015-12-16: 100 mg via INTRAVENOUS

## 2015-12-16 MED ORDER — FENTANYL CITRATE (PF) 100 MCG/2ML IJ SOLN: 25.0000 ug | INTRAMUSCULAR | Status: DC | PRN

## 2015-12-16 MED ORDER — LACTATED RINGERS IV SOLN: INTRAVENOUS | Status: DC

## 2015-12-16 MED ORDER — FENTANYL CITRATE (PF) 100 MCG/2ML IJ SOLN
25.0000 ug | INTRAMUSCULAR | Status: DC | PRN
  Administered 2015-12-16 (×4): 25 ug via INTRAVENOUS

## 2015-12-16 MED ORDER — LACTATED RINGERS IV SOLN
INTRAVENOUS | Status: DC
  Administered 2015-12-16 (×3): via INTRAVENOUS

## 2015-12-16 MED ORDER — DOCUSATE SODIUM 100 MG PO CAPS
100.0000 mg | ORAL_CAPSULE | Freq: Two times a day (BID) | ORAL | Status: DC
  Administered 2015-12-16: 100 mg via ORAL
  Filled 2015-12-16: qty 1

## 2015-12-16 MED ORDER — GENTAMICIN SULFATE 40 MG/ML IJ SOLN
INTRAVENOUS | Status: DC
  Filled 2015-12-16: qty 2.25

## 2015-12-16 MED ORDER — ONDANSETRON HCL 4 MG/2ML IJ SOLN: 4.0000 mg | Freq: Once | INTRAMUSCULAR | Status: DC | PRN

## 2015-12-16 MED ORDER — DEXAMETHASONE SODIUM PHOSPHATE 10 MG/ML IJ SOLN
INTRAMUSCULAR | Status: DC | PRN
  Administered 2015-12-16: 8 mg via INTRAVENOUS

## 2015-12-16 MED ORDER — GENTAMICIN SULFATE 40 MG/ML IJ SOLN
90.0000 mg | INTRAVENOUS | Status: AC
  Administered 2015-12-16: 90 mg via INTRAVENOUS
  Filled 2015-12-16: qty 2.25

## 2015-12-16 MED ORDER — KETOROLAC TROMETHAMINE 30 MG/ML IJ SOLN
30.0000 mg | Freq: Four times a day (QID) | INTRAMUSCULAR | Status: DC
  Administered 2015-12-16 – 2015-12-17 (×4): 30 mg via INTRAVENOUS
  Filled 2015-12-16 (×4): qty 1

## 2015-12-16 MED ORDER — PROPOFOL 10 MG/ML IV BOLUS
INTRAVENOUS | Status: DC | PRN
  Administered 2015-12-16: 200 mg via INTRAVENOUS

## 2015-12-16 MED ORDER — ONDANSETRON HCL 4 MG/2ML IJ SOLN
4.0000 mg | Freq: Four times a day (QID) | INTRAMUSCULAR | Status: DC | PRN
  Administered 2015-12-17: 4 mg via INTRAVENOUS
  Filled 2015-12-16: qty 2

## 2015-12-16 MED ORDER — FENTANYL CITRATE (PF) 100 MCG/2ML IJ SOLN
INTRAMUSCULAR | Status: AC
  Administered 2015-12-16: 25 ug via INTRAVENOUS
  Filled 2015-12-16: qty 2

## 2015-12-16 MED ORDER — GLYCOPYRROLATE 0.2 MG/ML IJ SOLN
INTRAMUSCULAR | Status: DC | PRN
  Administered 2015-12-16: .8 mg via INTRAVENOUS

## 2015-12-16 MED ORDER — PHENYLEPHRINE HCL 10 MG/ML IJ SOLN
INTRAMUSCULAR | Status: DC | PRN
  Administered 2015-12-16: 100 ug via INTRAVENOUS
  Administered 2015-12-16 (×6): 50 ug via INTRAVENOUS
  Administered 2015-12-16: 100 ug via INTRAVENOUS
  Administered 2015-12-16 (×2): 50 ug via INTRAVENOUS
  Administered 2015-12-16: 100 ug via INTRAVENOUS
  Administered 2015-12-16 (×2): 50 ug via INTRAVENOUS

## 2015-12-16 MED ORDER — LACTATED RINGERS IV SOLN: INTRAVENOUS | Status: DC | PRN

## 2015-12-16 MED ORDER — KETOROLAC TROMETHAMINE 30 MG/ML IJ SOLN: 30.0000 mg | Freq: Four times a day (QID) | INTRAMUSCULAR | Status: DC

## 2015-12-16 MED ORDER — DEXTROSE IN LACTATED RINGERS 5 % IV SOLN
INTRAVENOUS | Status: DC
  Administered 2015-12-16 – 2015-12-17 (×3): via INTRAVENOUS

## 2015-12-16 MED ORDER — FENTANYL CITRATE (PF) 100 MCG/2ML IJ SOLN
INTRAMUSCULAR | Status: DC | PRN
  Administered 2015-12-16: 100 ug via INTRAVENOUS
  Administered 2015-12-16 (×2): 50 ug via INTRAVENOUS
  Administered 2015-12-16: 100 ug via INTRAVENOUS
  Administered 2015-12-16: 50 ug via INTRAVENOUS

## 2015-12-16 SURGICAL SUPPLY — 46 items
BAG URO DRAIN 2000ML W/SPOUT (MISCELLANEOUS) ×3 IMPLANT
BLADE SURG SZ11 CARB STEEL (BLADE) ×3 IMPLANT
CATH FOLEY 2WAY  5CC 16FR (CATHETERS) ×2
CATH URTH 16FR FL 2W BLN LF (CATHETERS) ×1 IMPLANT
CHLORAPREP W/TINT 26ML (MISCELLANEOUS) ×3 IMPLANT
CORD MONOPOLAR M/FML 12FT (MISCELLANEOUS) ×3 IMPLANT
DRESSING TELFA 4X3 1S ST N-ADH (GAUZE/BANDAGES/DRESSINGS) ×3 IMPLANT
DRSG TEGADERM 2-3/8X2-3/4 SM (GAUZE/BANDAGES/DRESSINGS) ×9 IMPLANT
DRSG TEGADERM 2X2.25 PEDS (GAUZE/BANDAGES/DRESSINGS) ×9 IMPLANT
ELECT REM PT RETURN 9FT ADLT (ELECTROSURGICAL) ×3
ELECTRODE REM PT RTRN 9FT ADLT (ELECTROSURGICAL) ×1 IMPLANT
GAUZE PETRO XEROFOAM 1X8 (MISCELLANEOUS) ×3 IMPLANT
GLOVE BIO SURGEON STRL SZ 6 (GLOVE) ×15 IMPLANT
GLOVE BIO SURGEON STRL SZ8 (GLOVE) ×15 IMPLANT
GLOVE INDICATOR 6.5 STRL GRN (GLOVE) ×15 IMPLANT
GOWN STRL REUS W/ TWL LRG LVL3 (GOWN DISPOSABLE) ×2 IMPLANT
GOWN STRL REUS W/ TWL XL LVL3 (GOWN DISPOSABLE) ×1 IMPLANT
GOWN STRL REUS W/TWL LRG LVL3 (GOWN DISPOSABLE) ×4
GOWN STRL REUS W/TWL XL LVL3 (GOWN DISPOSABLE) ×2
IRRIGATION STRYKERFLOW (MISCELLANEOUS) ×1 IMPLANT
IRRIGATOR STRYKERFLOW (MISCELLANEOUS) ×3
IV LACTATED RINGERS 1000ML (IV SOLUTION) ×3 IMPLANT
KIT PINK PAD W/HEAD ARE REST (MISCELLANEOUS) ×3
KIT PINK PAD W/HEAD ARM REST (MISCELLANEOUS) ×1 IMPLANT
KIT RM TURNOVER CYSTO AR (KITS) ×3 IMPLANT
LABEL OR SOLS (LABEL) ×3 IMPLANT
LIQUID BAND (GAUZE/BANDAGES/DRESSINGS) ×3 IMPLANT
PACK BASIN MINOR ARMC (MISCELLANEOUS) ×3 IMPLANT
PACK GYN LAPAROSCOPIC (MISCELLANEOUS) ×3 IMPLANT
PAD OB MATERNITY 4.3X12.25 (PERSONAL CARE ITEMS) ×3 IMPLANT
PENCIL ELECTRO HAND CTR (MISCELLANEOUS) ×3 IMPLANT
SHEARS HARMONIC ACE PLUS 36CM (ENDOMECHANICALS) ×3 IMPLANT
SLEEVE ENDOPATH XCEL 5M (ENDOMECHANICALS) ×6 IMPLANT
SUT CHROMIC 2 0 CT 1 (SUTURE) ×12 IMPLANT
SUT MNCRL 4-0 (SUTURE) ×2
SUT MNCRL 4-0 27XMFL (SUTURE) ×1
SUT VIC AB 0 CT1 27 (SUTURE) ×4
SUT VIC AB 0 CT1 27XCR 8 STRN (SUTURE) ×2 IMPLANT
SUT VIC AB 0 CT1 36 (SUTURE) ×3 IMPLANT
SUT VIC AB 2-0 CT1 27 (SUTURE) ×2
SUT VIC AB 2-0 CT1 TAPERPNT 27 (SUTURE) ×1 IMPLANT
SUTURE MNCRL 4-0 27XMF (SUTURE) ×1 IMPLANT
SYRINGE 10CC LL (SYRINGE) IMPLANT
TAPE TRANSPORE STRL 2 31045 (GAUZE/BANDAGES/DRESSINGS) IMPLANT
TROCAR XCEL NON-BLD 5MMX100MML (ENDOMECHANICALS) ×3 IMPLANT
TUBING INSUFFLATOR HEATED (MISCELLANEOUS) ×3 IMPLANT

## 2015-12-16 NOTE — Interval H&P Note (Signed)
History and Physical Interval Note:  12/16/2015 1:04 PM  Laura [name]  has presented today for surgery, with the diagnosis of H/O UTERINE FIBROID, ABNORMAL UTERINE BLEEDING, PELVIC PAIN  The various methods of treatment have been discussed with the patient and family. After consideration of risks, benefits and other options for treatment, the patient has consented to  Procedure(s): LAPAROSCOPIC ASSISTED VAGINAL HYSTERECTOMY WITH BILATERAL SALPINGECTOMY (Bilateral) as a surgical intervention .  The patient's history has been reviewed, patient examined, no change in status, stable for surgery.  I have reviewed the patient's chart and labs.  Questions were answered to the patient's satisfaction.     Hassell Done A Aury Scollard

## 2015-12-16 NOTE — Anesthesia Preprocedure Evaluation (Signed)
Anesthesia Evaluation  Patient identified by MRN, date of birth, ID band Patient awake    Reviewed: Allergy & Precautions, NPO status , Patient's Chart, lab work & pertinent test results  Airway Mallampati: II       Dental   Pulmonary neg pulmonary ROS, Current Smoker,           Cardiovascular negative cardio ROS       Neuro/Psych Anxiety Depression negative neurological ROS     GI/Hepatic negative GI ROS, Neg liver ROS, GERD  Medicated and Poorly Controlled,  Endo/Other  negative endocrine ROS  Renal/GU negative Renal ROS     Musculoskeletal   Abdominal   Peds  Hematology negative hematology ROS (+)   Anesthesia Other Findings   Reproductive/Obstetrics                             Anesthesia Physical Anesthesia Plan  ASA: II  Anesthesia Plan: General   Post-op Pain Management:    Induction: Intravenous  Airway Management Planned: Oral ETT  Additional Equipment:   Intra-op Plan:   Post-operative Plan:   Informed Consent: I have reviewed the patients History and Physical, chart, labs and discussed the procedure including the risks, benefits and alternatives for the proposed anesthesia with the patient or authorized representative who has indicated his/her understanding and acceptance.     Plan Discussed with:   Anesthesia Plan Comments:         Anesthesia Quick Evaluation

## 2015-12-16 NOTE — Transfer of Care (Signed)
Immediate Anesthesia Transfer of Care Note  Patient: Laura [name]  Procedure(s) Performed: Procedure(s): LAPAROSCOPIC ASSISTED VAGINAL HYSTERECTOMY WITH BILATERAL SALPINGECTOMY (Bilateral)  Patient Location: PACU  Anesthesia Type:General  Level of Consciousness: awake and alert   Airway & Oxygen Therapy: Patient Spontanous Breathing and Patient connected to face mask oxygen  Post-op Assessment: Post -op Vital signs reviewed and stable  Post vital signs: stable  Last Vitals:  Filed Vitals:   12/16/15 1302 12/16/15 1626  BP: 121/79 122/87  Pulse: 115 99  Temp: 36.9 C 36.8 C  Resp: 20 11    Last Pain: There were no vitals filed for this visit.    Patients Stated Pain Goal: 2 ([id number])  Complications: No apparent anesthesia complications

## 2015-12-16 NOTE — Anesthesia Postprocedure Evaluation (Signed)
Anesthesia Post Note  Patient: Laura [name]  Procedure(s) Performed: Procedure(s) (LRB): LAPAROSCOPIC ASSISTED VAGINAL HYSTERECTOMY WITH BILATERAL SALPINGECTOMY (Bilateral)  Patient location during evaluation: PACU Anesthesia Type: General Level of consciousness: awake and alert and oriented Pain management: pain level controlled Vital Signs Assessment: post-procedure vital signs reviewed and stable Respiratory status: spontaneous breathing Cardiovascular status: blood pressure returned to baseline Anesthetic complications: no    Last Vitals:  Filed Vitals:   12/16/15 1746 12/16/15 1817  BP:  115/69  Pulse: 82 81  Temp:  37 C  Resp: 18 18    Last Pain:  Filed Vitals:   12/16/15 1838  PainSc: 7                  Deyana Wnuk

## 2015-12-16 NOTE — H&P (View-Only) (Signed)
Subjective:  PREOPERATIVE HISTORY AND PHYSICAL     Patient is a 30 y.o. G2P2040female scheduled for    LAVH bilateral salpingectomy For uterine fibroids and pelvic pain. Ultrasound demonstrates for uterine fibroids. Pelvic pain is left-sided.  Birth control pills did not control her abnormal uterine bleeding she was having bleeding almost every other week in the past 6 months along with associated painful cramping in the midline as well as left lower quadrant. She is also experiencing deep thrusting dyspareunia  [place]: Indications: Pelvic pain, left side. AUB. History of fibroids Findings:  The uterus measures 8.6 x 6.0 x 6.5 cm. Echo texture is heterogenous with evidence of focal masses. Within the uterus are at least 4 suspected fibroids. The largest 3 were measured. Fibroid 1: Anterior fundal. Appears intramural, measuring: 1.5 x 1.5 x 2.2 cm.  Fibroid 2: Posterior fundal with calcifications, pedunculated, measuring: 1.8 x 1.9 x 2.2 cm.  Fibroid 3: Posterior fundal, appears subserosal, measuring: 2.4 x 2.2 x 2.6 cm. These all appear to be slightly larger than on prior ultrasound of 2014.   The Endometrium measures 5.2 mm.  Right Ovary measures 3.1 x 2.5 x 2.2 cm, and appears WNL. Left Ovary measures 2.4 x 1.5 x 2.1 cm, and appears WNL with good vascular flow seen. Survey of the adnexa demonstrates no adnexal masses. There is no free fluid in the cul de sac.    Pertinent Gynecological History: Patient's last menstrual period was 10/06/2015 (exact date). Contraception: tubal ligation Last Pap: 05/2014 . Results were: normal Last mammogram: N/A. Results were: N/A  Discussed Blood/Blood Products: yes   Menstrual History: OB History    Gravida Para Term Preterm AB TAB SAB Ectopic Multiple Living   2 2 2      1 3       Menarche age: NA Patient's last menstrual period was 11/22/2015 (approximate).    Past Medical History  Diagnosis Date  . Depression   . Anxiety   .  Increased BMI   . Tobacco user   . Heavy periods     Past Surgical History  Procedure Laterality Date  . Cesarean section  2010  . Tubal ligation  2011    OB History  Gravida Para Term Preterm AB SAB TAB Ectopic Multiple Living  2 2 2      1 3     # Outcome Date GA Lbr Len/2nd Weight Sex Delivery Anes PTL Lv  2A Gravida 2010   4 lb 2.1 oz (1.873 kg) F CS-LTranv   Y  2B Term 2010   4 lb 4.8 oz (1.95 kg) F CS-LTranv   Y  1 Term 2009   7 lb 14.4 oz (3.583 kg) M Vag-Spont   Y      Social History   Social History  . Marital Status: Married    Spouse Name: N/A  . Number of Children: N/A  . Years of Education: N/A   Social History Main Topics  . Smoking status: Current Every Day Smoker -- 0.50 packs/day    Types: Cigarettes  . Smokeless tobacco: Never Used  . Alcohol Use: 0.0 oz/week    0 Standard drinks or equivalent per week     Comment: occasionally; beer  . Drug Use: No  . Sexual Activity: Yes    Birth Control/ Protection: Surgical   Other Topics Concern  . None   Social History Narrative    Family History  Problem Relation Age of Onset  . Mental illness Mother   .  Diabetes Maternal Grandmother   . Heart disease Maternal Grandfather   . Cancer Neg Hx      (Not in a hospital admission)  Allergies  Allergen Reactions  . Amoxicillin     Review of Systems Constitutional: No recent fever/chills/sweats Respiratory: No recent cough/bronchitis Cardiovascular: No chest pain Gastrointestinal: No recent nausea/vomiting/diarrhea Genitourinary: No UTI symptoms Hematologic/lymphatic:No history of coagulopathy or recent blood thinner use    Objective:    BP 100/68 mmHg  Pulse 105  Ht 5\' 2"  (1.575 m)  Wt 218 lb 1.6 oz (98.93 kg)  BMI 39.88 kg/m2  LMP 11/22/2015 (Approximate)  General:   Normal  Skin:   normal  HEENT:  Normal  Neck:  Supple without Adenopathy or Thyromegaly  Lungs:   Heart:              Breasts:   Abdomen:  Pelvis:  M/S    Extremeties:  Neuro:    clear to auscultation bilaterally   Normal without murmur   Not Examined   soft, non-tender; bowel sounds normal; no masses,  no organomegaly   Exam deferred to OR  No CVAT  Warm/Dry   Normal       10/23/2015 PELVIC: External Genitalia: Normal BUS: Normal Vagina: Normal Cervix: Normal; minimal cervical motion tenderness; no lesions. Uterus: Normal; top normal size, mobile, mildly tender Adnexa: Normal; not palpable and nontender RV: Not examined and External Exam NormaI    Assessment:    1. Symptomatic fibroid uterus 2. Chronic pelvic pain midline and left lower quadrant 3. Abnormal uterine bleeding 4. Status post cesarean section and BTL.   Plan:  LAVH bilateral salpingectomy   Preoperative counseling: The patient is to undergo LAVH bilateral salpingectomy for symptomatic fibroid uterus, abnormal uterine bleeding refractory to medical therapy, and chronic pelvic pain in the midline and left lower quadrant along with deep thrusting dyspareunia. She is understanding of the planned procedure and is aware of and is accepting of all surgical risks which include but are not limited to bleeding, infection, pelvic organ injury with need for repair, blood clot disorders, anesthesia risks, etc. All questions have been answered. Informed consent is given. Patient is ready and willing to proceed with surgery as scheduled.  Brayton Mars, MD  Note: This dictation was prepared with Dragon dictation along with smaller phrase technology. Any transcriptional errors that result from this process are unintentional.

## 2015-12-16 NOTE — Op Note (Signed)
OPERATIVE NOTE:  Laura [name] PROCEDURE DATE: 12/16/2015   PREOPERATIVE DIAGNOSIS:  1. Chronic pelvic pain 2. Leiomyoma uteri  POSTOPERATIVE DIAGNOSIS:  1. Chronic Pelvic pain 2. Leiomyoma uteri  PROCEDURE: LAVH bilateral salpingectomy SURGEON:  Brayton Mars, MD ASSISTANTS: Dr. Marcelline Mates and PA-S [place]: General INDICATIONS: 30 y.o. G2P2003 with multi-fibroid uterus, symptomatic, desiring definitive surgical intervention  FINDINGS:  FINDINGS:  Multiple fibroid uterus; normal ovaries bilaterally; fallopian tubes contained paratubal cysts and there was evidence of previous partial salpingectomy.  Upper abdomen was normal. Ureters were visualized in appropriate anatomic orientation and had bilateral peristalsis at the end of the procedure   I/O's: Total I/O In: 2000 [I.V.:2000] Out: 550 [Urine:200; Blood:350] COUNTS:  YES SPECIMENS: Uterus with cervix ANTIBIOTIC PROPHYLAXIS:Gentamicin/clindamycin COMPLICATIONS: None immediate  PROCEDURE IN DETAIL:  Patient was brought to the operating room where she was placed in supine position. General endotracheal anesthesia was induced without difficulty. She was placed in the dorsal lithotomy position using bumblebee stirrups. A ChloraPrep and Betadine abdominal perineal intravaginal prep and drape was performed in standard fashion. Timeout was performed. Foley catheter was placed and was draining clear yellow urine. TENACULUM WAS PLACED ON THE CERVIX TO FACILITATE UTERINE MANIPULATION INTRAOPERATIVELY. SUBUMBILICAL VERTICAL INCISION 5 MM IN LENGTH WAS MADE. THE OPTIVIEW LAPAROSCOPIC TROCAR SYSTEM WAS INSERTED DIRECTLY INTO THE ABDOMINAL PELVIC CAVITY WITHOUT EVIDENCE OF BOWEL OR VASCULAR INJURY. 2 OTHER 5 MM PORTS WERE PLACED IN THE RIGHT AND LEFT LOWER QUADRANTS RESPECTIVELY UNDER DIRECT VISUALIZATION. THE ABDOMEN AND PELVIS WAS SURVEYED WITH THE ABOVE FINDINGS BEING NOTED.  FALLOPIAN TUBES WERE EXCISED WITH THE  ACE HARMONIC SCALPEL BEING USED TO CLAMP COAGULATED AND CUT THE MESOSALPINX. THESE STRUCTURES WERE PLACED IN THE CUL-DE-SAC FOR REMOVAL AT THE END OF THE CASE. HYSTERECTOMY WAS PERFORMED IN STANDARD FASHION. ROUND LIGAMENTS WERE CLAMPED LIGATED AND CUT USING THE ACE HARMONIC SCALPEL. THE UTERO-OVARIAN LIGAMENTS WERE CLAMPED COAGULATED AND CUT. SEQUENTIALLY THE CARDINAL BROAD LIGAMENT COMPLEXES WERE THEN TAKEN DOWN USING THE ACE HARMONIC SCALPEL. THE ANTERIOR BLADDER FLAP WAS CREATED WITH THE HARMONIC SCALPEL. MODERATE SCARRING WAS IDENTIFIED AT THE BLADDER FLAP. THE UTERINE ARTERIES WERE IDENTIFIED AND COAGULATED WITH KLEPPINGER BIPOLAR FORCEPS. FOLLOWING COMPLETION OF THE ABDOMINAL PORTION OF THE PROCEDURE, ATTENTION WAS THEN TRANSFIXED TO THE VAGINAL APPROACH TO COMPLETE THE PROCEDURE. WEIGHTED SPECULUM WAS PLACED IN THE VAGINA. DOUBLE-TOOTH TENACULUM WAS PLACED ON THE CERVIX. POSTERIOR COLPOTOMY WAS MADE WITH MAYO SCISSORS. UTEROSACRAL LIGAMENTS WERE CLAMPED CUT AND STICK TIED USING 0 VICRYL SUTURE. THE VAGINA WAS DISSECTED OFF THE ANTERIOR ASPECT OF THE LOWER UTERINE SEGMENT AND CERVIX SHARPLY AND BLUNT DISSECTION AND EVENTUALLY THE ANTERIOR CUL-DE-SAC WAS ENTERED. REMAINDER OF THE CARDINAL BROAD LIGAMENT COMPLEXES WERE THEN CLAMPED CUT AND STICK TIED USING 0 VICRYL SUTURE. ONE ADDITIONAL FIGURE-OF-EIGHT SUTURE WAS NECESSARY TO OBTAIN OPTIMAL hemostasis in the vagina. The specimen when freed from all pedicles was removed from the operative field. The posterior vaginal mucosa was then closed using a running baseball stitch of 0 Vicryl. The vagina was reapproximated with simple interrupted sutures of 2-0 chromic. Following closure of the vagina, repeat laparoscopy was performed. Irrigation of the pelvis and survey of the pelvis demonstrated Good hemostasis. The ureters were noted to be in appropriate anatomic orientation and were peristalsing bilaterally. Procedure was then terminated with all instrumentation  being removed from the abdominal pelvic cavity. Pneumoperitoneum was released. Incisions were closed with 4-0 Monocryl suture. Telfa and Tegaderm dressings were applied. Patient was then awakened extubated and taken to recovery in  satisfactory condition.  Laura [name] A. Laura Plants, MD, A[facility]

## 2015-12-16 NOTE — Anesthesia Procedure Notes (Signed)
Procedure Name: Intubation Date/Time: 12/16/2015 1:55 PM Performed by: Allean Found Pre-anesthesia Checklist: Patient identified, Emergency Drugs available, Suction available, Timeout performed and Patient being monitored Patient Re-evaluated:Patient Re-evaluated prior to inductionOxygen Delivery Method: Circle system utilized Preoxygenation: Pre-oxygenation with 100% oxygen Intubation Type: IV induction Ventilation: Mask ventilation without difficulty Laryngoscope Size: Mac and 3 Grade View: Grade III Tube type: Oral Tube size: 7.0 mm Number of attempts: 2 Airway Equipment and Method: Stylet Placement Confirmation: ETT inserted through vocal cords under direct vision,  positive ETCO2 and breath sounds checked- equal and bilateral Secured at: 21 cm Tube secured with: Tape Dental Injury: Teeth and Oropharynx as per pre-operative assessment  Difficulty Due To: Difficulty was unanticipated Comments: Started with square head pillow, MAC 3 no stylet, could only visualize arytenoids, added stylet, still difficult to move large tongue, removed square pillow for wider mouth opening

## 2015-12-17 ENCOUNTER — Encounter: Payer: Self-pay | Admitting: Obstetrics and Gynecology

## 2015-12-17 DIAGNOSIS — N838 Other noninflammatory disorders of ovary, fallopian tube and broad ligament: Secondary | ICD-10-CM | POA: Diagnosis not present

## 2015-12-17 DIAGNOSIS — G8929 Other chronic pain: Secondary | ICD-10-CM | POA: Diagnosis not present

## 2015-12-17 DIAGNOSIS — F329 Major depressive disorder, single episode, unspecified: Secondary | ICD-10-CM | POA: Diagnosis not present

## 2015-12-17 DIAGNOSIS — N72 Inflammatory disease of cervix uteri: Secondary | ICD-10-CM | POA: Diagnosis not present

## 2015-12-17 DIAGNOSIS — D259 Leiomyoma of uterus, unspecified: Secondary | ICD-10-CM | POA: Diagnosis not present

## 2015-12-17 DIAGNOSIS — N888 Other specified noninflammatory disorders of cervix uteri: Secondary | ICD-10-CM | POA: Diagnosis not present

## 2015-12-17 DIAGNOSIS — R102 Pelvic and perineal pain: Secondary | ICD-10-CM | POA: Diagnosis not present

## 2015-12-17 DIAGNOSIS — Z833 Family history of diabetes mellitus: Secondary | ICD-10-CM | POA: Diagnosis not present

## 2015-12-17 DIAGNOSIS — F17210 Nicotine dependence, cigarettes, uncomplicated: Secondary | ICD-10-CM | POA: Diagnosis not present

## 2015-12-17 MED ORDER — OXYCODONE-ACETAMINOPHEN 5-325 MG PO TABS: 1.0000 | ORAL_TABLET | ORAL | Status: DC | PRN

## 2015-12-17 MED ORDER — DOCUSATE SODIUM 100 MG PO CAPS: 100.0000 mg | ORAL_CAPSULE | Freq: Two times a day (BID) | ORAL | Status: DC

## 2015-12-17 MED ORDER — ONDANSETRON HCL 4 MG PO TABS: 4.0000 mg | ORAL_TABLET | Freq: Four times a day (QID) | ORAL | Status: DC | PRN

## 2015-12-17 MED ORDER — IBUPROFEN 800 MG PO TABS: 800.0000 mg | ORAL_TABLET | Freq: Three times a day (TID) | ORAL | Status: DC

## 2015-12-17 NOTE — Progress Notes (Signed)
Discharge instructions complete and prescriptions given. Patient verbalizes understanding of teaching. Patient discharged home at 1450.

## 2015-12-17 NOTE — Discharge Summary (Signed)
Physician Discharge Summary  Patient ID: Laura [name] MRN: [id number] DOB/AGE: 1985-12-24 30 y.o.  Admit date: 12/16/2015 Discharge date: 12/17/2015  Admission Diagnoses: Leiomyoma Uteri and Chronic Pelvic Pain  Discharge Diagnoses:  Leiomyoma and Chronic Pelvic Pain  FINDINGS: FINDINGS:  Multiple fibroid uterus; normal ovaries bilaterally; fallopian tubes contained paratubal cysts and there was evidence of previous partial salpingectomy. Upper abdomen was normal. Ureters were visualized in appropriate anatomic orientation and had bilateral peristalsis at the end of the procedure Operative Procedures: Procedure(s): LAPAROSCOPIC ASSISTED VAGINAL HYSTERECTOMY WITH BILATERAL SALPINGECTOMY (Bilateral)  Hospital Course: Uncomplicated   Significant Diagnostic Studies:  Lab Results  Component Value Date   HGB 13.1 12/16/2015   HGB 13.7 12/04/2015   HGB 14.1 04/24/2013   Lab Results  Component Value Date   HCT 41.4 12/04/2015   HCT 39.9 03/19/2015   HCT 40.8 04/24/2013   CBC Latest Ref Rng 12/16/2015 12/04/2015 03/19/2015  WBC 3.6 - 11.0 K/uL - 6.8 6.4  Hemoglobin 12.0 - 16.0 g/dL 13.1 13.7 -  Hematocrit 35.0 - 47.0 % - 41.4 39.9  Platelets 150 - 440 K/uL - 299 352     Discharged Condition: good  Discharge Exam: Blood pressure 104/63, pulse 82, temperature 98.2 F (36.8 C), temperature source Oral, resp. rate 18, height 5\' 2"  (1.575 m), weight 218 lb (98.884 kg), last menstrual period 10/23/2015, SpO2 100 %. Incision/Wound: clean, dry and no drainage  Disposition: discharge      Discharge Instructions    Discharge patient    Complete by:  As directed             Medication List    TAKE these medications        BIOTIN PO  Take by mouth 2 (two) times daily.     docusate sodium 100 MG capsule  Commonly known as:  COLACE  Take 1 capsule (100 mg total) by mouth 2 (two) times daily.     ibuprofen 800 MG tablet  Commonly known as:  ADVIL,MOTRIN  Take 1  tablet (800 mg total) by mouth 3 (three) times daily.     ondansetron 4 MG tablet  Commonly known as:  ZOFRAN  Take 1 tablet (4 mg total) by mouth every 6 (six) hours as needed for nausea.     oxyCODONE-acetaminophen 5-325 MG tablet  Commonly known as:  PERCOCET/ROXICET  Take 1-2 tablets by mouth every 4 (four) hours as needed (moderate to severe pain (when tolerating fluids)).       Follow-up Information    Follow up with Brayton Mars, MD. Go in 1 week.   Specialties:  Obstetrics and Gynecology, Radiology   Why:  Post op check   Contact information:   [address] [place] [address] [phone]       Signed: Alanda Slim DefrancescoMD 12/17/2015, 1:41 PM

## 2015-12-18 LAB — SURGICAL PATHOLOGY

## 2015-12-25 ENCOUNTER — Encounter: Payer: Self-pay | Admitting: Obstetrics and Gynecology

## 2015-12-25 ENCOUNTER — Ambulatory Visit (INDEPENDENT_AMBULATORY_CARE_PROVIDER_SITE_OTHER): Payer: 59 | Admitting: Obstetrics and Gynecology

## 2015-12-25 VITALS — BP 119/85 | HR 102 | Ht 62.0 in | Wt 211.3 lb

## 2015-12-25 DIAGNOSIS — N939 Abnormal uterine and vaginal bleeding, unspecified: Secondary | ICD-10-CM

## 2015-12-25 DIAGNOSIS — R399 Unspecified symptoms and signs involving the genitourinary system: Secondary | ICD-10-CM | POA: Diagnosis not present

## 2015-12-25 DIAGNOSIS — Z90710 Acquired absence of both cervix and uterus: Secondary | ICD-10-CM

## 2015-12-25 DIAGNOSIS — Z09 Encounter for follow-up examination after completed treatment for conditions other than malignant neoplasm: Secondary | ICD-10-CM

## 2015-12-25 LAB — POCT URINALYSIS DIPSTICK
BILIRUBIN UA: NEGATIVE
Glucose, UA: NEGATIVE
KETONES UA: NEGATIVE
LEUKOCYTES UA: NEGATIVE
Nitrite, UA: NEGATIVE
PH UA: 8.5
PROTEIN UA: NEGATIVE
RBC UA: NEGATIVE
SPEC GRAV UA: 1.01
Urobilinogen, UA: 0.2

## 2015-12-25 NOTE — Addendum Note (Signed)
Addended by: Elouise Munroe on: 12/25/2015 03:40 PM   Modules accepted: Orders

## 2015-12-25 NOTE — Progress Notes (Signed)
Chief complaint: 1. One week postop check 2. Status post LAVH bilateral salpingectomy  Issues post surgery include: 1. Bright red bleeding per vagina requiring super pad use for protection 2. UTI symptoms 3. Chronic constipation with first bowel movement from surgery current yesterday 4. More emotional and anxious of late, occasionally tearful. 5. Occasional vasomotor symptoms  PATHOLOGY: DIAGNOSIS:  A. UTERUS WITH CERVIX AND BILATERAL FALLOPIAN TUBES; HYSTERECTOMY WITH  BILATERAL SALPINGECTOMY:  - NABOTHIAN CYSTS AND CHRONIC CERVICITIS.  - SECRETORY ENDOMETRIUM.  - LEIOMYOMATA (UP TO 1.5 CM), FOCALLY CALCIFIED; WITHOUT ATYPIA,  NECROSIS OR INCREASED MITOSES.  - BILATERAL FALLOPIAN TUBES WITH PARATUBAL CYSTS.   OBJECTIVE: BP 119/85 mmHg  Pulse 102  Ht 5\' 2"  (1.575 m)  Wt 211 lb 4.8 oz (95.845 kg)  BMI 38.64 kg/m2  LMP 11/22/2015 (Approximate) Pleasant African-American female in no acute distress. Abdomen: Well-healed laparoscopy incisions Pelvic exam: External genitalia normal BUS normal Vagina-vaginal cuff notable for isolated area of bright red bleeding/oozing; Monsel's applied for hemostasis Bimanual-deferred  ASSESSMENT: 1. Status post LAVH bilateral salpingectomy 1 week ago 2. Vaginal bleeding isolated to vaginal cuff with Monsel solution applied for hemostasis 3. UTI symptoms 4. Chronic constipation, now resolved 5. Vasomotor symptoms, intermittent  PLAN: 1. Monsel's applied to vaginal cuff bleeding sites 2. Return in 24 hours for recheck 3. Urinalysis and culture to rule out UTI 4. Monitor symptomatology of hot flashes and night sweats and emotional lability  Brayton Mars, MD  Note: This dictation was prepared with Dragon dictation along with smaller phrase technology. Any transcriptional errors that result from this process are unintentional.

## 2015-12-25 NOTE — Patient Instructions (Signed)
1. Return in 24 hours for recheck on vaginal bleeding at vaginal cuff

## 2015-12-26 ENCOUNTER — Ambulatory Visit: Payer: 59 | Admitting: Obstetrics and Gynecology

## 2015-12-28 LAB — URINE CULTURE

## 2016-01-15 ENCOUNTER — Encounter: Payer: Self-pay | Admitting: Obstetrics and Gynecology

## 2016-01-21 ENCOUNTER — Ambulatory Visit (INDEPENDENT_AMBULATORY_CARE_PROVIDER_SITE_OTHER): Payer: 59 | Admitting: Obstetrics and Gynecology

## 2016-01-21 ENCOUNTER — Encounter: Payer: Self-pay | Admitting: Obstetrics and Gynecology

## 2016-01-21 VITALS — BP 120/86 | HR 108 | Ht 62.0 in | Wt 211.9 lb

## 2016-01-21 DIAGNOSIS — Z90710 Acquired absence of both cervix and uterus: Secondary | ICD-10-CM

## 2016-01-21 DIAGNOSIS — Z09 Encounter for follow-up examination after completed treatment for conditions other than malignant neoplasm: Secondary | ICD-10-CM | POA: Diagnosis not present

## 2016-01-21 NOTE — Patient Instructions (Addendum)
1. Resume all activities without restriction 2. Return in 1 year for annual exam 3. Return in 3 months for follow-up on high flashes and vaginal discharge 4. Consider Aleve or Advil for joint discomfort

## 2016-01-21 NOTE — Progress Notes (Signed)
Chief complaint: 1. Final postop check 2. Status post LAVH bilateral salpingectomy  Patient is doing well with normal bowel and bladder function. She is not experiencing any significant pain. Patient denies vaginal bleeding. She is experiencing a vaginal discharge. Patient does report some generalized joint aches and pains as well as some hot flashes.  PATHOLOGY:  Weight: 144 grams  DIAGNOSIS:  A. UTERUS WITH CERVIX AND BILATERAL FALLOPIAN TUBES; HYSTERECTOMY WITH  BILATERAL SALPINGECTOMY:  - NABOTHIAN CYSTS AND CHRONIC CERVICITIS.  - SECRETORY ENDOMETRIUM.  - LEIOMYOMATA (UP TO 1.5 CM), FOCALLY CALCIFIED; WITHOUT ATYPIA,  NECROSIS OR INCREASED MITOSES.  - BILATERAL FALLOPIAN TUBES WITH PARATUBAL CYSTS.   OBJECTIVE: BP 120/86   Pulse (!) 108   Ht 5\' 2"  (1.575 m)   Wt 211 lb 14.4 oz (96.1 kg)   LMP 10/23/2015   BMI 38.76 kg/m  pleasant female in no acute distress. Alert and oriented. Abdomen: Soft, nontender Pelvic exam: External genitalia normal BUS-normal Vagina-moderate yellow-white discharge; 7 mm diameter granulation tissue at the vaginal cuff apex, slightly friable; vaginal cuff intact Cervix-surgically absent Uterus surgically absent Bimanual-deferred  PROCEDURE: Granulation tissue treated with Monsel solution  ASSESSMENT: 1. Six-week postop check status post LAVH bilateral salpingectomy for uterine fibroids 2. Vaginal discharge secondary to granulation tissue 3. Vasomotor symptoms  PLAN: 1. Monsel solution applied to granulation tissue 2. Monitor symptoms high flashes and night sweats 3. Aleve or Advil as needed for joint discomfort 4. Return in 3 months for follow-up on granulation tissue and vasomotor symptoms 5. Return in 6 year for annual exam  Brayton Mars, MD  Note: This dictation was prepared with Dragon dictation along with smaller phrase technology. Any transcriptional errors that result from this process are unintentional.

## 2016-01-24 ENCOUNTER — Encounter: Payer: Self-pay | Admitting: Obstetrics and Gynecology

## 2016-01-29 ENCOUNTER — Ambulatory Visit: Payer: Self-pay | Admitting: Physician Assistant

## 2016-01-29 ENCOUNTER — Encounter: Payer: Self-pay | Admitting: Physician Assistant

## 2016-01-29 VITALS — BP 120/70 | HR 86 | Temp 98.3°F

## 2016-01-29 DIAGNOSIS — N390 Urinary tract infection, site not specified: Secondary | ICD-10-CM

## 2016-01-29 DIAGNOSIS — M2550 Pain in unspecified joint: Secondary | ICD-10-CM

## 2016-01-29 LAB — POCT URINALYSIS DIPSTICK
Bilirubin, UA: NEGATIVE
GLUCOSE UA: NEGATIVE
Ketones, UA: NEGATIVE
NITRITE UA: NEGATIVE
PH UA: 6.5
PROTEIN UA: NEGATIVE
RBC UA: NEGATIVE
SPEC GRAV UA: 1.025
UROBILINOGEN UA: 0.2

## 2016-01-29 MED ORDER — MELOXICAM 15 MG PO TABS: 15.0000 mg | ORAL_TABLET | Freq: Every day | ORAL | 0 refills | Status: DC

## 2016-01-29 MED ORDER — METHYLPREDNISOLONE 4 MG PO TBPK: ORAL_TABLET | ORAL | 0 refills | Status: DC

## 2016-01-29 MED ORDER — CIPROFLOXACIN HCL 500 MG PO TABS: 500.0000 mg | ORAL_TABLET | Freq: Two times a day (BID) | ORAL | 0 refills | Status: DC

## 2016-01-29 NOTE — Progress Notes (Signed)
S: c/o low back pain, joint pain, hip pain, states she had a hysterectomy 5 weeks ago and was cleared by her md, states she told him she was having joint pain but it has now gotten a lot worse, increased pain with movement, has been taking ibuprofen 800mg  bid and standing in hot shower without relief, denies fever/chills/injury, tick bite  O: vitals wnl, nad, spine tender in lower lumbar area, hips tender along lateral aspect at trochanter bursa and fascia of IT band, pain reproduced in hip with external rotation, n/v intact; ua 1+ leuks  A: uti, joint pain  P: cipro 500mg  bid x 3d, medrol dose pack, mobic 15mg , otc turmeric, if not better in 5-7 days return to clinic or see regular md, would consider testing for lymes at that time

## 2016-03-04 ENCOUNTER — Other Ambulatory Visit: Payer: Self-pay | Admitting: Family

## 2016-03-04 ENCOUNTER — Ambulatory Visit: Payer: Self-pay | Admitting: Family

## 2016-03-04 VITALS — BP 120/86 | HR 80 | Temp 98.4°F

## 2016-03-04 DIAGNOSIS — J0120 Acute ethmoidal sinusitis, unspecified: Secondary | ICD-10-CM

## 2016-03-04 DIAGNOSIS — H109 Unspecified conjunctivitis: Secondary | ICD-10-CM

## 2016-03-04 NOTE — Progress Notes (Signed)
S /  C/o sinus pressure , ear pain , ST x 5 days  , eyes mattered ,crusty x 4 days, not responding to otc visine, sudafed, nyquil , benadryl . Denies  Seasonal allergies , denies CP or SOB, fever . Works in [employer] .   O/ VSS alert pleasant NAD,  PERRLA , eoms full, conjunctivae injected bilat ENT  Nasal mucosa red,swollen with purulent rhinorhea, throat with increased PND, neck supple without nodes, heart rsr lungs clear O/ sinusitis  conjunctivitis ,bilaterally  P/ rx zpack , cipro opthalmic . Supportive measures out of work x 24 hours / or until redness cleared.f/u prn not improving.

## 2016-03-26 ENCOUNTER — Other Ambulatory Visit: Payer: Self-pay

## 2016-03-26 ENCOUNTER — Encounter: Payer: Self-pay | Admitting: Obstetrics and Gynecology

## 2016-03-26 DIAGNOSIS — R232 Flushing: Secondary | ICD-10-CM

## 2016-04-22 ENCOUNTER — Encounter: Payer: Self-pay | Admitting: Obstetrics and Gynecology

## 2016-04-22 ENCOUNTER — Ambulatory Visit (INDEPENDENT_AMBULATORY_CARE_PROVIDER_SITE_OTHER): Payer: 59 | Admitting: Obstetrics and Gynecology

## 2016-04-22 VITALS — BP 104/71 | HR 88 | Ht 62.0 in | Wt 209.1 lb

## 2016-04-22 DIAGNOSIS — R232 Flushing: Secondary | ICD-10-CM | POA: Insufficient documentation

## 2016-04-22 DIAGNOSIS — N762 Acute vulvitis: Secondary | ICD-10-CM

## 2016-04-22 DIAGNOSIS — Z90710 Acquired absence of both cervix and uterus: Secondary | ICD-10-CM

## 2016-04-22 DIAGNOSIS — R102 Pelvic and perineal pain: Secondary | ICD-10-CM

## 2016-04-22 MED ORDER — NYSTATIN-TRIAMCINOLONE 100000-0.1 UNIT/GM-% EX OINT: 1.0000 "application " | TOPICAL_OINTMENT | Freq: Two times a day (BID) | CUTANEOUS | 1 refills | Status: DC

## 2016-04-22 NOTE — Progress Notes (Signed)
Chief complaint: 1. Vaginal discharge 2. History of granulation tissue  Patient is status post LAVH bilateral salpingectomy on 12/16/2015. 3 months ago she was treated with Monsel solution for management of granulation tissue. She is experiencing minimal discharge at this time. No vaginal bleeding. She does have some mild discomfort with intercourse. Patient is also experiencing some intermittent vasomotor symptoms. The symptoms may be less than when she first experienced some postop. She is also experiencing some ilioinguinal itching around her hairline; nystatin alone has not helped.  Past medical history, past surgical history, problem list, medications, and allergies are reviewed.   OBJECTIVE: BP 104/71   Pulse 88   Ht 5\' 2"  (1.575 m)   Wt 209 lb 1.6 oz (94.8 kg)   LMP 10/23/2015   BMI 38.24 kg/m Pelvic exam: External genitalia normal; no evidence of vulvitis at this time BUS-normal Vagina-good vault support; apex of vagina is well-healed Cervix-surgically absent Uterus- surgically absent Bimanual-no palpable masses or tenderness  ASSESSMENT: 1. Hot flashes, decreasing 2. Status post LAVH; granulation tissue result  PLAN: 1. Resume all activities without restriction 2. [place] 3. Nystatin/triamcinolone cream is written; apply twice a day to vulva for 7-10 days.  A total of 15 minutes were spent face-to-face with the patient during this encounter and over half of that time dealt with counseling and coordination of care.  Brayton Mars, MD  Note: This dictation was prepared with Dragon dictation along with smaller phrase technology. Any transcriptional errors that result from this process are unintentional.

## 2016-04-22 NOTE — Patient Instructions (Addendum)
1. FSH is drawn 2. Nystatin/triamcinolone cream to be applied topically to the vulva twice a day for 7-10 days 3. Return in 1 year for physical

## 2016-04-23 LAB — FOLLICLE STIMULATING HORMONE: FSH: 6.3 m[IU]/mL

## 2016-06-10 ENCOUNTER — Ambulatory Visit: Payer: Self-pay | Admitting: Physician Assistant

## 2016-06-15 ENCOUNTER — Ambulatory Visit (INDEPENDENT_AMBULATORY_CARE_PROVIDER_SITE_OTHER): Payer: 59 | Admitting: Family Medicine

## 2016-06-15 ENCOUNTER — Encounter: Payer: Self-pay | Admitting: Family Medicine

## 2016-06-15 VITALS — BP 120/82 | HR 94 | Temp 98.2°F | Ht 62.5 in | Wt 217.4 lb

## 2016-06-15 DIAGNOSIS — F419 Anxiety disorder, unspecified: Secondary | ICD-10-CM

## 2016-06-15 DIAGNOSIS — Z23 Encounter for immunization: Secondary | ICD-10-CM

## 2016-06-15 DIAGNOSIS — Z7689 Persons encountering health services in other specified circumstances: Secondary | ICD-10-CM

## 2016-06-15 DIAGNOSIS — F331 Major depressive disorder, recurrent, moderate: Secondary | ICD-10-CM

## 2016-06-15 MED ORDER — BUPROPION HCL ER (XL) 150 MG PO TB24: 150.0000 mg | ORAL_TABLET | Freq: Every day | ORAL | 0 refills | Status: DC

## 2016-06-15 NOTE — Assessment & Plan Note (Signed)
Will titrate her back up on the Wellbutrin, 150 mg x 1 week then will start her on 300 mg daily.

## 2016-06-15 NOTE — Patient Instructions (Signed)
Tdap Vaccine (Tetanus, Diphtheria and Pertussis): What You Need to Know 1. Why get vaccinated? Tetanus, diphtheria and pertussis are very serious diseases. Tdap vaccine can protect us from these diseases. And, Tdap vaccine given to pregnant women can protect newborn babies against pertussis. TETANUS (Lockjaw) is rare in the [place] today. It causes painful muscle tightening and stiffness, usually all over the body.  It can lead to tightening of muscles in the head and neck so you can't open your mouth, swallow, or sometimes even breathe. Tetanus kills about 1 out of 10 people who are infected even after receiving the best medical care.  DIPHTHERIA is also rare in the [place] today. It can cause a thick coating to form in the back of the throat.  It can lead to breathing problems, heart failure, paralysis, and death.  PERTUSSIS (Whooping Cough) causes severe coughing spells, which can cause difficulty breathing, vomiting and disturbed sleep.  It can also lead to weight loss, incontinence, and rib fractures. Up to 2 in 100 adolescents and 5 in 100 adults with pertussis are hospitalized or have complications, which could include pneumonia or death.  These diseases are caused by bacteria. Diphtheria and pertussis are spread from person to person through secretions from coughing or sneezing. Tetanus enters the body through cuts, scratches, or wounds. Before vaccines, as many as 200,000 cases of diphtheria, 200,000 cases of pertussis, and hundreds of cases of tetanus, were reported in the [place] each year. Since vaccination began, reports of cases for tetanus and diphtheria have dropped by about 99% and for pertussis by about 80%. 2. Tdap vaccine Tdap vaccine can protect adolescents and adults from tetanus, diphtheria, and pertussis. One dose of Tdap is routinely given at age 11 or 12. People who did not get Tdap at that age should get it as soon as possible. Tdap is especially  important for healthcare professionals and anyone having close contact with a baby younger than 12 months. Pregnant women should get a dose of Tdap during every pregnancy, to protect the newborn from pertussis. Infants are most at risk for severe, life-threatening complications from pertussis. Another vaccine, called Td, protects against tetanus and diphtheria, but not pertussis. A Td booster should be given every 10 years. Tdap may be given as one of these boosters if you have never gotten Tdap before. Tdap may also be given after a severe cut or burn to prevent tetanus infection. Your doctor or the person giving you the vaccine can give you more information. Tdap may safely be given at the same time as other vaccines. 3. Some people should not get this vaccine  A person who has ever had a life-threatening allergic reaction after a previous dose of any diphtheria, tetanus or pertussis containing vaccine, OR has a severe allergy to any part of this vaccine, should not get Tdap vaccine. Tell the person giving the vaccine about any severe allergies.  Anyone who had coma or long repeated seizures within 7 days after a childhood dose of DTP or DTaP, or a previous dose of Tdap, should not get Tdap, unless a cause other than the vaccine was found. They can still get Td.  Talk to your doctor if you: ? have seizures or another nervous system problem, ? had severe pain or swelling after any vaccine containing diphtheria, tetanus or pertussis, ? ever had a condition called Guillain-Barr Syndrome (GBS), ? aren't feeling well on the day the shot is scheduled. 4. Risks With any medicine, including   vaccines, there is a chance of side effects. These are usually mild and go away on their own. Serious reactions are also possible but are rare. Most people who get Tdap vaccine do not have any problems with it. Mild problems following Tdap: (Did not interfere with activities)  Pain where the shot was given (about  3 in 4 adolescents or 2 in 3 adults)  Redness or swelling where the shot was given (about 1 person in 5)  Mild fever of at least 100.4F (up to about 1 in 25 adolescents or 1 in 100 adults)  Headache (about 3 or 4 people in 10)  Tiredness (about 1 person in 3 or 4)  Nausea, vomiting, diarrhea, stomach ache (up to 1 in 4 adolescents or 1 in 10 adults)  Chills, sore joints (about 1 person in 10)  Body aches (about 1 person in 3 or 4)  Rash, swollen glands (uncommon)  Moderate problems following Tdap: (Interfered with activities, but did not require medical attention)  Pain where the shot was given (up to 1 in 5 or 6)  Redness or swelling where the shot was given (up to about 1 in 16 adolescents or 1 in 12 adults)  Fever over 102F (about 1 in 100 adolescents or 1 in 250 adults)  Headache (about 1 in 7 adolescents or 1 in 10 adults)  Nausea, vomiting, diarrhea, stomach ache (up to 1 or 3 people in 100)  Swelling of the entire arm where the shot was given (up to about 1 in 500).  Severe problems following Tdap: (Unable to perform usual activities; required medical attention)  Swelling, severe pain, bleeding and redness in the arm where the shot was given (rare).  Problems that could happen after any vaccine:  People sometimes faint after a medical procedure, including vaccination. Sitting or lying down for about 15 minutes can help prevent fainting, and injuries caused by a fall. Tell your doctor if you feel dizzy, or have vision changes or ringing in the ears.  Some people get severe pain in the shoulder and have difficulty moving the arm where a shot was given. This happens very rarely.  Any medication can cause a severe allergic reaction. Such reactions from a vaccine are very rare, estimated at fewer than 1 in a million doses, and would happen within a few minutes to a few hours after the vaccination. As with any medicine, there is a very remote chance of a vaccine  causing a serious injury or death. The safety of vaccines is always being monitored. For more information, visit: www.cdc.gov/vaccinesafety/ 5. What if there is a serious problem? What should I look for? Look for anything that concerns you, such as signs of a severe allergic reaction, very high fever, or unusual behavior. Signs of a severe allergic reaction can include hives, swelling of the face and throat, difficulty breathing, a fast heartbeat, dizziness, and weakness. These would usually start a few minutes to a few hours after the vaccination. What should I do?  If you think it is a severe allergic reaction or other emergency that can't wait, call 9-1-1 or get the person to the nearest hospital. Otherwise, call your doctor.  Afterward, the reaction should be reported to the Vaccine Adverse Event Reporting System (VAERS). Your doctor might file this report, or you can do it yourself through the VAERS web site at www.vaers.hhs.gov, or by calling 1-800-822-7967. ? VAERS does not give medical advice. 6. The National Vaccine Injury Compensation Program The National   Vaccine Injury Compensation Program (VICP) is a federal program that was created to compensate people who may have been injured by certain vaccines. Persons who believe they may have been injured by a vaccine can learn about the program and about filing a claim by calling 1-800-338-2382 or visiting the VICP website at www.hrsa.gov/vaccinecompensation. There is a time limit to file a claim for compensation. 7. How can I learn more?  Ask your doctor. He or she can give you the vaccine package insert or suggest other sources of information.  Call your local or state health department.  Contact the Centers for Disease Control and Prevention (CDC): ? Call 1-800-232-4636 (1-800-CDC-INFO) or ? Visit CDC's website at www.cdc.gov/vaccines CDC Tdap Vaccine VIS (08/22/13) This information is not intended to replace advice given to you by your  health care provider. Make sure you discuss any questions you have with your health care provider. Document Released: 12/15/2011 Document Revised: 03/05/2016 Document Reviewed: 03/05/2016 Elsevier Interactive Patient Education  2017 Elsevier Inc.  

## 2016-06-15 NOTE — Progress Notes (Signed)
BP 120/82 (BP Location: Left Arm, Patient Position: Sitting, Cuff Size: Large)   Pulse 94   Temp 98.2 F (36.8 C)   Ht 5' 2.5" (1.588 m)   Wt 217 lb 6.4 oz (98.6 kg)   LMP 10/23/2015   SpO2 99%   BMI 39.13 kg/m    Subjective:    Patient ID: Laura [name], female    DOB: [birth year], 30 y.o.   MRN: [id number]  HPI: Laura [name] is a 30 y.o. female  Chief Complaint  Patient presents with  . Establish Care   Patient presents today to establish care. Has been without a primary care provider for about 6 months now. Has been off anxiety and depression medications since summer when she had to stop seeing her PCP - was on wellbutrin 300 mg daily and 1 mg alprazolam BID. Has been going to therapy twice monthly since being off medications to try and help herself cope but the anxiety and depression are now starting to affect her daily life. Had to leave work 2 months ago due to a severe panic attack, and notes weekends now she just wants to sit by herself and do nothing when previously she was very active and outgoing. Noting weight gain during this time as well. Denies SI/HI.   Depression screen PHQ 2/9 06/15/2016  Decreased Interest 2  Down, Depressed, Hopeless 2  PHQ - 2 Score 4  Altered sleeping 3  Tired, decreased energy 3  Change in appetite 2  Feeling bad or failure about yourself  2  Trouble concentrating 1  Moving slowly or fidgety/restless 0  Suicidal thoughts 0  PHQ-9 Score 15    Relevant past medical, surgical, family and social history reviewed and updated as indicated. Interim medical history since our last visit reviewed. Allergies and medications reviewed and updated.  Review of Systems  Constitutional: Positive for unexpected weight change.  HENT: Negative.   Respiratory: Negative.   Cardiovascular: Negative.   Gastrointestinal: Negative.   Genitourinary: Negative.   Musculoskeletal: Negative.   Neurological: Negative.   Psychiatric/Behavioral:  Positive for dysphoric mood. The patient is nervous/anxious.     Per HPI unless specifically indicated above     Objective:    BP 120/82 (BP Location: Left Arm, Patient Position: Sitting, Cuff Size: Large)   Pulse 94   Temp 98.2 F (36.8 C)   Ht 5' 2.5" (1.588 m)   Wt 217 lb 6.4 oz (98.6 kg)   LMP 10/23/2015   SpO2 99%   BMI 39.13 kg/m   Wt Readings from Last 3 Encounters:  06/15/16 217 lb 6.4 oz (98.6 kg)  04/22/16 209 lb 1.6 oz (94.8 kg)  01/21/16 211 lb 14.4 oz (96.1 kg)    Physical Exam  Constitutional: She is oriented to person, place, and time. She appears well-developed and well-nourished.  HENT:  Head: Atraumatic.  Eyes: Conjunctivae are normal. Pupils are equal, round, and reactive to light.  Neck: Normal range of motion. Neck supple.  Cardiovascular: Normal rate and normal heart sounds.   Pulmonary/Chest: Effort normal and breath sounds normal. No respiratory distress.  Musculoskeletal: Normal range of motion.  Neurological: She is alert and oriented to person, place, and time.  Skin: Skin is warm and dry.  Psychiatric: She has a normal mood and affect. Her behavior is normal.  Nursing note and vitals reviewed.     Assessment & Plan:   Problem List Items Addressed This Visit      Other  Depression    Will titrate her back up on the Wellbutrin, 150 mg x 1 week then will start her on 300 mg daily.       Relevant Medications   buPROPion (WELLBUTRIN XL) 150 MG 24 hr tablet   Anxiety    Patient to return in about a week to sign controlled substance agreement and get started back on her xanax 1 mg BID. Continue regular counseling sessions with hopes of decreasing this in the future.       Relevant Medications   buPROPion (WELLBUTRIN XL) 150 MG 24 hr tablet    Other Visit Diagnoses    Encounter to establish care    -  Primary   Need for diphtheria-tetanus-pertussis (Tdap) vaccine, adult/adolescent       Relevant Orders   Tdap vaccine greater than or  equal to 30yo IM (Completed)       Follow up plan: Return in about 1 week (around 06/22/2016) for medication management.

## 2016-06-15 NOTE — Assessment & Plan Note (Signed)
Patient to return in about a week to sign controlled substance agreement and get started back on her xanax 1 mg BID. Continue regular counseling sessions with hopes of decreasing this in the future.

## 2016-06-24 ENCOUNTER — Ambulatory Visit (INDEPENDENT_AMBULATORY_CARE_PROVIDER_SITE_OTHER): Payer: 59 | Admitting: Family Medicine

## 2016-06-24 ENCOUNTER — Encounter: Payer: Self-pay | Admitting: Family Medicine

## 2016-06-24 VITALS — BP 122/83 | HR 90 | Temp 98.4°F | Wt 217.0 lb

## 2016-06-24 DIAGNOSIS — F419 Anxiety disorder, unspecified: Secondary | ICD-10-CM

## 2016-06-24 DIAGNOSIS — F331 Major depressive disorder, recurrent, moderate: Secondary | ICD-10-CM | POA: Diagnosis not present

## 2016-06-24 MED ORDER — BUPROPION HCL ER (XL) 300 MG PO TB24: 300.0000 mg | ORAL_TABLET | Freq: Every day | ORAL | 1 refills | Status: DC

## 2016-06-24 MED ORDER — ALPRAZOLAM 1 MG PO TABS: 1.0000 mg | ORAL_TABLET | Freq: Two times a day (BID) | ORAL | 0 refills | Status: DC | PRN

## 2016-06-24 NOTE — Assessment & Plan Note (Signed)
Increase to 300 mg wellbutrin, follow up in 1 month

## 2016-06-24 NOTE — Assessment & Plan Note (Signed)
Controlled substance agreement signed and discussed. Rx given for xanax BID prn. Follow up in 1 month

## 2016-06-24 NOTE — Progress Notes (Signed)
   BP 122/83   Pulse 90   Temp 98.4 F (36.9 C)   Wt 217 lb (98.4 kg)   LMP 10/23/2015   SpO2 99%   BMI 39.06 kg/m    Subjective:    Patient ID: Laura [name], female    DOB: 06/10/1986, 30 y.o.   MRN: [id number]  HPI: Laura [name] is a 30 y.o. female  Chief Complaint  Patient presents with  . Anxiety    here to sign controlled substance agreement today   Patient presents for 1 week follow up today. Doing well getting back on wellbutrin for the most part. The holidays are very difficult for her, so she is unsure if it is the medication or the stress of the season but she has felt a bit more irritable than normal the past week since restarting. Overall though doing well and wanting to continue tritrating the medicine back to where she was previously.   Was previously well controlled with prn xanax, but has been out for several months now. Wanting to resume the medicine and ok with signing controlled substance agreement today.   Relevant past medical, surgical, family and social history reviewed and updated as indicated. Interim medical history since our last visit reviewed. Allergies and medications reviewed and updated.  Review of Systems  Constitutional: Negative.   HENT: Negative.   Eyes: Negative.   Respiratory: Negative.   Cardiovascular: Negative.   Gastrointestinal: Negative.   Musculoskeletal: Negative.   Neurological: Negative.   Psychiatric/Behavioral: Positive for dysphoric mood. The patient is nervous/anxious.     Per HPI unless specifically indicated above     Objective:    BP 122/83   Pulse 90   Temp 98.4 F (36.9 C)   Wt 217 lb (98.4 kg)   LMP 10/23/2015   SpO2 99%   BMI 39.06 kg/m   Wt Readings from Last 3 Encounters:  06/24/16 217 lb (98.4 kg)  06/15/16 217 lb 6.4 oz (98.6 kg)  04/22/16 209 lb 1.6 oz (94.8 kg)    Physical Exam  Constitutional: She is oriented to person, place, and time. She appears well-developed and  well-nourished.  HENT:  Head: Atraumatic.  Eyes: Conjunctivae are normal. Pupils are equal, round, and reactive to light.  Neck: Normal range of motion. Neck supple.  Cardiovascular: Normal rate and normal heart sounds.   Pulmonary/Chest: Effort normal and breath sounds normal.  Musculoskeletal: Normal range of motion.  Neurological: She is alert and oriented to person, place, and time.  Skin: Skin is warm and dry.  Psychiatric: She has a normal mood and affect. Her behavior is normal.      Assessment & Plan:   Problem List Items Addressed This Visit      Other   Depression - Primary    Increase to 300 mg wellbutrin, follow up in 1 month       Relevant Medications   ALPRAZolam (XANAX) 1 MG tablet   buPROPion (WELLBUTRIN XL) 300 MG 24 hr tablet   Anxiety    Controlled substance agreement signed and discussed. Rx given for xanax BID prn. Follow up in 1 month      Relevant Medications   ALPRAZolam (XANAX) 1 MG tablet   buPROPion (WELLBUTRIN XL) 300 MG 24 hr tablet       Follow up plan: Return in about 4 weeks (around 07/22/2016) for Depression, anxiety.

## 2016-06-24 NOTE — Patient Instructions (Signed)
Follow up in 1 month for medication management

## 2016-07-22 ENCOUNTER — Ambulatory Visit (INDEPENDENT_AMBULATORY_CARE_PROVIDER_SITE_OTHER): Payer: 59 | Admitting: Family Medicine

## 2016-07-22 ENCOUNTER — Encounter: Payer: Self-pay | Admitting: Family Medicine

## 2016-07-22 VITALS — BP 107/77 | HR 98 | Temp 98.2°F | Wt 214.0 lb

## 2016-07-22 DIAGNOSIS — F419 Anxiety disorder, unspecified: Secondary | ICD-10-CM | POA: Diagnosis not present

## 2016-07-22 DIAGNOSIS — F331 Major depressive disorder, recurrent, moderate: Secondary | ICD-10-CM

## 2016-07-22 DIAGNOSIS — R635 Abnormal weight gain: Secondary | ICD-10-CM

## 2016-07-22 MED ORDER — TRIAMCINOLONE ACETONIDE 0.1 % EX CREA: 1.0000 "application " | TOPICAL_CREAM | Freq: Two times a day (BID) | CUTANEOUS | 0 refills | Status: DC

## 2016-07-22 MED ORDER — BUPROPION HCL ER (XL) 300 MG PO TB24: 300.0000 mg | ORAL_TABLET | Freq: Every day | ORAL | 6 refills | Status: DC

## 2016-07-22 NOTE — Assessment & Plan Note (Signed)
Well controlled with current regimen, continue.

## 2016-07-22 NOTE — Patient Instructions (Signed)
Follow up in 3 months, sooner if needed.

## 2016-07-22 NOTE — Assessment & Plan Note (Signed)
Doing very well back on the 300 mg Wellbutrin. Continue current regimen

## 2016-07-22 NOTE — Progress Notes (Signed)
BP 107/77   Pulse 98   Temp 98.2 F (36.8 C)   Wt 214 lb (97.1 kg)   LMP 11/22/2015 (Approximate)   SpO2 99%   BMI 38.52 kg/m    Subjective:    Patient ID: Laura [name], female    DOB: 1986-06-08, 31 y.o.   MRN: [id number]  HPI: Laura [name] is a 31 y.o. female  Chief Complaint  Patient presents with  . Depression  . Anxiety  . Weight Gain    going to the gym and working on her diet, wants to know if she can get back on phenteramine.    Patient presents for 1 month follow up. Doing very well overall back on previous medications. Is now up to full 300 mg wellbutrin and doing so much better than previously. Still having some irritability but states it is improving and this has been an ongoing issue for her for 15 years or more. Anxiety symptoms have been under fair control, going through some things at home that have been extra stressful the past week but otherwise feels like it is well controlled. Denies CP, SOB, palpitations, N/V/D.   Also wanting to discuss her weight. Recently started working out regularly as well as working with a Automotive engineer and improving her diet. Doing well with this, but wanting to discuss going back on phentermine as she had good success with it in the past.   Past Medical History:  Diagnosis Date  . Anxiety   . Complication of anesthesia    "woke up during surgery"  . Depression   . Heavy periods   . Incontinence of urine   . Increased BMI   . Indigestion   . PTSD (post-traumatic stress disorder)   . Tobacco user    Social History   Social History  . Marital status: Married    Spouse name: N/A  . Number of children: N/A  . Years of education: N/A   Occupational History  . Not on file.   Social History Main Topics  . Smoking status: Current Every Day Smoker    Packs/day: 0.25    Types: Cigarettes  . Smokeless tobacco: Never Used  . Alcohol use 0.0 oz/week     Comment: occasionally; beer  . Drug use: No  . Sexual  activity: Yes    Birth control/ protection: Surgical   Other Topics Concern  . Not on file   Social History Narrative  . No narrative on file    Relevant past medical, surgical, family and social history reviewed and updated as indicated. Interim medical history since our last visit reviewed. Allergies and medications reviewed and updated.  Review of Systems  Constitutional: Negative.   HENT: Negative.   Eyes: Negative.   Respiratory: Negative.   Cardiovascular: Negative.   Gastrointestinal: Negative.   Genitourinary: Negative.   Musculoskeletal: Negative.   Neurological: Negative.   Psychiatric/Behavioral: The patient is nervous/anxious.     Per HPI unless specifically indicated above   Depression screen [facility] 2/9 07/22/2016 06/15/2016  Decreased Interest 0 2  Down, Depressed, Hopeless 1 2  PHQ - 2 Score 1 4  Altered sleeping - 3  Tired, decreased energy - 3  Change in appetite - 2  Feeling bad or failure about yourself  - 2  Trouble concentrating - 1  Moving slowly or fidgety/restless - 0  Suicidal thoughts - 0  PHQ-9 Score - 15   GAD 7 : Generalized Anxiety Score 07/22/2016  Nervous, Anxious, on  Edge 2  Control/stop worrying 3  Worry too much - different things 3  Trouble relaxing 2  Restless 1  Easily annoyed or irritable 1  Afraid - awful might happen 0  Total GAD 7 Score 12       Objective:    BP 107/77   Pulse 98   Temp 98.2 F (36.8 C)   Wt 214 lb (97.1 kg)   LMP 11/22/2015 (Approximate)   SpO2 99%   BMI 38.52 kg/m   Wt Readings from Last 3 Encounters:  07/22/16 214 lb (97.1 kg)  06/24/16 217 lb (98.4 kg)  06/15/16 217 lb 6.4 oz (98.6 kg)    Physical Exam  Constitutional: She is oriented to person, place, and time. She appears well-developed and well-nourished. No distress.  HENT:  Head: Atraumatic.  Eyes: Conjunctivae are normal. No scleral icterus.  Neck: Normal range of motion. Neck supple.  Cardiovascular: Normal rate.     Pulmonary/Chest: Effort normal and breath sounds normal.  Musculoskeletal: Normal range of motion.  Neurological: She is alert and oriented to person, place, and time.  Skin: Skin is warm and dry.  Psychiatric: She has a normal mood and affect. Her behavior is normal. Judgment and thought content normal.  Nursing note and vitals reviewed.     Assessment & Plan:   Problem List Items Addressed This Visit      Other   Depression    Doing very well back on the 300 mg Wellbutrin. Continue current regimen      Relevant Medications   buPROPion (WELLBUTRIN XL) 300 MG 24 hr tablet   Anxiety - Primary    Well controlled with current regimen, continue.       Relevant Medications   buPROPion (WELLBUTRIN XL) 300 MG 24 hr tablet    Other Visit Diagnoses    Weight gain       Discussed to follow up with GYN about managing phentermine. Continue lifestyle modifications, down 3 lb - congratulated.        Follow up plan: Return in about 3 months (around 10/20/2016).

## 2016-07-27 ENCOUNTER — Other Ambulatory Visit: Payer: Self-pay | Admitting: Family Medicine

## 2016-07-27 MED ORDER — ALPRAZOLAM 1 MG PO TABS: 1.0000 mg | ORAL_TABLET | Freq: Two times a day (BID) | ORAL | 0 refills | Status: DC | PRN

## 2016-07-29 ENCOUNTER — Encounter: Payer: 59 | Attending: Family Medicine | Admitting: Dietician

## 2016-07-29 VITALS — Ht 62.0 in | Wt 214.7 lb

## 2016-07-29 DIAGNOSIS — Z713 Dietary counseling and surveillance: Secondary | ICD-10-CM | POA: Insufficient documentation

## 2016-07-29 NOTE — Patient Instructions (Addendum)
1. Increase dietary protein, decrease fat, intake adequate carbohydrates for daily activity  2. Continue to eat breakfast and incorporate regular/ healthy snacks after dinner and between meals as needed  3.Continue with newly incorporated exercise regimen, increase activity from 1-2x/wk to 3x/wk

## 2016-07-29 NOTE — Progress Notes (Signed)
Notes from [employer] [facility] employee "self referral" nutrition session: Start time: 3:30pm   End time: 4:30pm  Met with employee to discuss her nutritional concerns and diet history. The employee's questions/concerns were also addressed.  We discussed the following topics:  Healthy Eating  Exercise  Diabetes  Weight Concerns  I also provided the following handouts as reinforcement of the educational session:  Planning a Balanced Meal  Food Guide Plate  Sample menus and/or recipes  Healthy Snacking   Additional Comments: Employee states her greatest struggle is choosing healthy food options. Is concerned with weight gain due to family history of diabetes. At the start of the new year she and fellow employees started a weight reduction program. They attend 1-2 group classes/ week at the fitness center, and she also does at home [place] videos occasionally. Sessions last 30-45 minutes. She also aims to walk 10,000 steps/ day minimum which are tracked on a FitBit. Has also downloaded apps that allow her to track caloric intake. Calories have been "cut" to around 1600cal per this app, which she states she meets some days, goes over on some days, and is under on some days. Would like to be more consistent with eating and with other dietary habits. RD reviewed recommended caloric intake for weight loss, dangers of dropping calories too low, and healthy weight loss goals. RD also reminded employee of the importance of carbohydrates in the diet, as she reports to have significantly decreased intake of this macronutrient. Reviewed role of dietary fats and proteins in a healthy eating plan and also the importance of all macronutrients in relation to exercise.  Daily meal breakdown as of the New Year: Breakfast: coffee, instant oatmeal, yogurt or special K (2% milk) Lunch: varies, lean cuisine, BLT with baked chips, water with crystal light Dinner: varies, microwaveable meal like lean cuisine, chicken, salad,  broccoli Snacks: used to snack heavily at night- items like sweets & popcorn, now snacks on fruit or yogurt and has cut back amounts eaten.  Goals Agreed Upon 1. Increase dietary sources of protein, decrease percent of total calories coming from dietary fat, intake adequate carbohydrates daily  2. Continue to eat breakfast, limit after dinner snacking (or substitute with healthier snacks), and choose water over sodas or other sugar-sweetened beverages  3. Continue with newly implemented exercise program, aim to increase exercise to 3 days/ week.

## 2016-08-06 ENCOUNTER — Telehealth: Payer: 59 | Admitting: Nurse Practitioner

## 2016-08-06 DIAGNOSIS — J019 Acute sinusitis, unspecified: Secondary | ICD-10-CM | POA: Diagnosis not present

## 2016-08-06 DIAGNOSIS — J209 Acute bronchitis, unspecified: Secondary | ICD-10-CM

## 2016-08-06 DIAGNOSIS — B9689 Other specified bacterial agents as the cause of diseases classified elsewhere: Secondary | ICD-10-CM

## 2016-08-06 MED ORDER — PREDNISONE 10 MG PO TABS: 10.0000 mg | ORAL_TABLET | Freq: Every day | ORAL | 0 refills | Status: AC

## 2016-08-06 MED ORDER — DOXYCYCLINE HYCLATE 100 MG PO TABS: 100.0000 mg | ORAL_TABLET | Freq: Two times a day (BID) | ORAL | 0 refills | Status: AC

## 2016-08-06 MED ORDER — FLUTICASONE PROPIONATE 50 MCG/ACT NA SUSP: 2.0000 | Freq: Every day | NASAL | 0 refills | Status: DC

## 2016-08-06 MED ORDER — BENZONATATE 100 MG PO CAPS: 100.0000 mg | ORAL_CAPSULE | Freq: Two times a day (BID) | ORAL | 0 refills | Status: AC | PRN

## 2016-08-06 NOTE — Progress Notes (Signed)
We are sorry that you are not feeling well.  Here is how we plan to help!  Based on what you have shared with me it looks like you have sinusitis.  Sinusitis is inflammation and infection in the sinus cavities of the head.  Based on your presentation I believe you most likely have Acute Bacterial Sinusitis.This is an infection most likely caused by a bacteria.  You may use an oral decongestant such as Mucinex D or if you have glaucoma or high blood pressure use plain Mucinex. Saline nasal spray help and can safely be used as often as needed for congestion, I have prescribed: Fluticasone nasal spray two sprays in each nostril twice a day.  I am also prescribing an antibiotic, Doxycycline 100mg  twice daily for 7 days.  For the cough, I am prescribing Tessalon Perles 100mg  twice daily as needed for your cough.   Some authorities believe that zinc sprays or the use of Echinacea may shorten the course of your symptoms.  Sinus infections are not as easily transmitted as other respiratory infection, however we still recommend that you avoid close contact with loved ones, especially the very young and elderly.  Remember to wash your hands thoroughly throughout the day as this is the number one way to prevent the spread of infection!  Home Care:  Only take medications as instructed by your medical team.  Complete the entire course of an antibiotic.  Do not take these medications with alcohol.  A steam or ultrasonic humidifier can help congestion.  You can place a towel over your head and breathe in the steam from hot water coming from a faucet.  Avoid close contacts especially the very young and the elderly.  Cover your mouth when you cough or sneeze.  Always remember to wash your hands.  Get Help Right Away If:  You develop worsening fever or sinus pain.  You develop a severe head ache or visual changes.  Your symptoms persist after you have completed your treatment plan.  Make sure  you  Understand these instructions.  Will watch your condition.  Will get help right away if you are not doing well or get worse.  Your e-visit answers were reviewed by a board certified advanced clinical practitioner to complete your personal care plan.  Depending on the condition, your plan could have included both over the counter or prescription medications.  If there is a problem please reply  once you have received a response from your provider.  Your safety is important to [place].  If you have drug allergies check your prescription carefully.    You can use MyChart to ask questions about today's visit, request a non-urgent call back, or ask for a work or school excuse for 24 hours related to this e-Visit. If it has been greater than 24 hours you will need to follow up with your provider, or enter a new e-Visit to address those concerns.  You will get an e-mail in the next two days asking about your experience.  I hope that your e-visit has been valuable and will speed your recovery. Thank you for using e-visits.

## 2016-08-20 ENCOUNTER — Ambulatory Visit (INDEPENDENT_AMBULATORY_CARE_PROVIDER_SITE_OTHER): Payer: 59 | Admitting: Obstetrics and Gynecology

## 2016-08-20 ENCOUNTER — Encounter: Payer: Self-pay | Admitting: Obstetrics and Gynecology

## 2016-08-20 VITALS — BP 102/73 | HR 99 | Ht 62.0 in | Wt 212.1 lb

## 2016-08-20 DIAGNOSIS — E669 Obesity, unspecified: Secondary | ICD-10-CM

## 2016-08-20 MED ORDER — PHENTERMINE HCL 37.5 MG PO TABS: 37.5000 mg | ORAL_TABLET | Freq: Every day | ORAL | 2 refills | Status: DC

## 2016-08-20 MED ORDER — CYANOCOBALAMIN 1000 MCG/ML IJ SOLN: 1000.0000 ug | INTRAMUSCULAR | 1 refills | Status: DC

## 2016-08-20 NOTE — Patient Instructions (Signed)
Use the protein bar by Atkins because they have lots of fiber in them  Find the low carb flatbreads, tortillas and pita breads for sandwiches:  Joseph's makes a pita bread and a flat bread , available at [employer] and [employer]; Toufayah makes a low carb flatbread available at [employer] and HT that is 9 net carbs and 100 cal Mission makes a low carb whole wheat tortilla available at [employer],and most grocery stores with 6 net carbs and 210 cal  Greek yogurt can still have a lot of carbs .  Dannon Light N fit has 80 cal and 8 carbs  

## 2016-08-20 NOTE — Progress Notes (Signed)
Subjective:  Laura [name] is a 31 y.o. G2P2003 at Unknown being seen today for weight loss management- initial visit.  Patient reports General ROS: negative and reports previous weight loss attempts:exercise and reducing calorie intake. 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 Is exercising 3 times a week at hospital gym, and drinks 5-6 bottles of water a day currently. Has lost 7 #s since early Jan.   The patient has a surgical history of:  hysterectomy.  Past treatment has included: small frequent feedings, nutritional supplement, vitamin supplement,  antidepressant,  exercise management.  The following portions of the patient's history were reviewed and updated as appropriate: allergies, current medications, past family history, past medical history, past social history, past surgical history and problem list.   Objective:   Vitals:   08/20/16 1444  BP: 102/73  Pulse: 99  Weight: 212 lb 1.6 oz (96.2 kg)  Height: 5\' 2"  (1.575 m)    General:  Alert, oriented and cooperative. Patient is in no acute distress.  :   :   :   :   :   :   PE: Well groomed female in no current distress,   Mental Status: Normal mood and affect. Normal behavior. Normal judgment and thought content.   Current BMI: Body mass index is 38.79 kg/m.   Assessment and Plan:  Obesity  There are no diagnoses linked to this encounter.  Plan: low carb, High protein diet RX for adipex 37.5 mg daily and B12 1035mcg.ml monthly, to start now with first injection given at today's visit. Reviewed side-effects common to both medications and expected outcomes. Increase daily water intake to at least 8 bottle a day, every day.  Goal is to reduse weight by 10% by end of three months, and will re-evaluate then.  RTC in 4 weeks for Nurse visit to check weight & BP, and get next B12 injections.    Please refer to After Visit Summary for other counseling recommendations.    Hayzel Ruberg [place],  CNM   Sindy Mccune [employer], CNM      Consider the Low Glycemic Index Diet and 6 smaller meals daily .  This boosts your metabolism and regulates your sugars:   Use the protein bar by Atkins because they have lots of fiber in them  Find the low carb flatbreads, tortillas and pita breads for sandwiches:  Joseph's makes a pita bread and a flat bread , available at [facility] and [employer]J's; Hinckley makes a low carb flatbread available at [employer] and HT that is 9 net carbs and 100 cal Mission makes a low carb whole wheat tortilla available at [employer] most grocery stores with 6 net carbs and 210 cal  [place] yogurt can still have a lot of carbs .  Dannon Light N fit has 80 cal and 8 carbs

## 2016-09-10 ENCOUNTER — Other Ambulatory Visit: Payer: Self-pay | Admitting: Family Medicine

## 2016-09-10 MED ORDER — ALPRAZOLAM 1 MG PO TABS: 1.0000 mg | ORAL_TABLET | Freq: Two times a day (BID) | ORAL | 0 refills | Status: DC | PRN

## 2016-09-10 NOTE — Telephone Encounter (Signed)
Xanax refill sent. Upcoming f/u in 2 weeks

## 2016-09-21 ENCOUNTER — Ambulatory Visit (INDEPENDENT_AMBULATORY_CARE_PROVIDER_SITE_OTHER): Payer: 59 | Admitting: Obstetrics and Gynecology

## 2016-09-21 VITALS — BP 117/87 | HR 87 | Ht 62.0 in | Wt 208.7 lb

## 2016-09-21 DIAGNOSIS — E669 Obesity, unspecified: Secondary | ICD-10-CM | POA: Diagnosis not present

## 2016-09-21 MED ORDER — CYANOCOBALAMIN 1000 MCG/ML IJ SOLN
1000.0000 ug | Freq: Once | INTRAMUSCULAR | Status: AC
  Administered 2016-09-21: 1000 ug via INTRAMUSCULAR

## 2016-09-21 NOTE — Progress Notes (Signed)
Patient ID: Laura [name], female   DOB: 09-23-1985, 31 y.o.   MRN: [id number] Pt presents for weight, B/P, B-12 injection. No side effects of medication-Phentermine, or B-12.  Weight loss of 4 lbs. Encouraged eating healthy and exercise. Pt forgot her B-12 medication, so it was provided in house.

## 2016-10-09 ENCOUNTER — Other Ambulatory Visit: Payer: Self-pay | Admitting: Family Medicine

## 2016-10-09 MED ORDER — ALPRAZOLAM 1 MG PO TABS: 1.0000 mg | ORAL_TABLET | Freq: Two times a day (BID) | ORAL | 0 refills | Status: DC | PRN

## 2016-10-14 ENCOUNTER — Telehealth: Payer: Self-pay

## 2016-10-14 ENCOUNTER — Other Ambulatory Visit: Payer: Self-pay

## 2016-10-14 ENCOUNTER — Encounter: Payer: Self-pay | Admitting: Family Medicine

## 2016-10-14 ENCOUNTER — Ambulatory Visit (INDEPENDENT_AMBULATORY_CARE_PROVIDER_SITE_OTHER): Payer: 59 | Admitting: Family Medicine

## 2016-10-14 VITALS — BP 109/77 | HR 103 | Temp 98.3°F | Wt 204.0 lb

## 2016-10-14 DIAGNOSIS — Z113 Encounter for screening for infections with a predominantly sexual mode of transmission: Secondary | ICD-10-CM | POA: Diagnosis not present

## 2016-10-14 DIAGNOSIS — M5442 Lumbago with sciatica, left side: Secondary | ICD-10-CM

## 2016-10-14 DIAGNOSIS — F331 Major depressive disorder, recurrent, moderate: Secondary | ICD-10-CM

## 2016-10-14 DIAGNOSIS — F419 Anxiety disorder, unspecified: Secondary | ICD-10-CM

## 2016-10-14 MED ORDER — BUSPIRONE HCL 7.5 MG PO TABS: 7.5000 mg | ORAL_TABLET | Freq: Three times a day (TID) | ORAL | 1 refills | Status: DC

## 2016-10-14 MED ORDER — VORTIOXETINE HBR 10 MG PO TABS: 10.0000 mg | ORAL_TABLET | Freq: Every day | ORAL | 1 refills | Status: DC

## 2016-10-14 NOTE — Progress Notes (Signed)
BP 109/77   Pulse (!) 103   Temp 98.3 F (36.8 C)   Wt 204 lb (92.5 kg)   LMP 11/22/2015 (Approximate)   SpO2 100%   BMI 37.31 kg/m    Subjective:    Patient ID: Laura [name], female    DOB: Nov 15, 1985, 31 y.o.   MRN: [id number]  HPI: Laura [name] is a 31 y.o. female  Chief Complaint  Patient presents with  . Depression    follow up, she stopped the Wellbutrin a few days ago, She was having more crying spells. Easily agitated. Seems to be a little better now. Still not sleeping much.   . Anxiety    panic attacks have been increased since she was on the Wellbutrin.  . Back Pain    lower back pain x 2 weeks, pain/numbness going left leg occasionally. No known injury.   . STD Screening    she'd like a full STD work up.   Patient presents for follow-up depression and anxiety. Stopped wellbutrin 3 days ago as she felt it was making her sxs worse. More crying spells and agitation, increased frequency of panic episodes recently. Anxiety is worse in the evenings as she gets ready to go home. Also not sleeping other than an hour here and there at night. Notes she is having serious marital issues right now and thinks this has a lot do with this trend. Wanting to see about what other medications she could take. Has tried prozac, lexapro (SI), vibryyd, depakote in the past with poor tolerance due to weight gain, lack of efficacy, fatigue.   Also having new back pain x 2 weeks with some radiation occasionally down left leg. No known injury. Taking ibuprofen, icy hot and tramadol with no relief.   Having urinary sxs, ran a U/A at work this morning that came back clear. Wanting full STI panel as she was separated from her husband for a few weeks and is unaware if he had any extramarital sexual contact. Not having any discharge, sores, fever, chills, N/V.   Relevant past medical, surgical, family and social history reviewed and updated as indicated. Interim medical history since our  last visit reviewed. Allergies and medications reviewed and updated.  Depression screen [facility] 2/9 10/16/2016 07/29/2016 07/22/2016 06/15/2016  Decreased Interest 2 0 0 2  Down, Depressed, Hopeless 3 1 1 2   PHQ - 2 Score 5 1 1 4   Altered sleeping 3 - - 3  Tired, decreased energy 3 - - 3  Change in appetite 3 - - 2  Feeling bad or failure about yourself  3 - - 2  Trouble concentrating 2 - - 1  Moving slowly or fidgety/restless 1 - - 0  Suicidal thoughts 1 - - 0  PHQ-9 Score 21 - - 15   GAD 7 : Generalized Anxiety Score 10/16/2016 07/22/2016  Nervous, Anxious, on Edge 3 2  Control/stop worrying 3 3  Worry too much - different things 3 3  Trouble relaxing 3 2  Restless 1 1  Easily annoyed or irritable 3 1  Afraid - awful might happen 1 0  Total GAD 7 Score 17 12  Anxiety Difficulty Very difficult -   Review of Systems  Constitutional: Negative.   HENT: Negative.   Eyes: Negative.   Respiratory: Negative.   Cardiovascular: Negative.   Gastrointestinal: Negative.   Genitourinary: Positive for dysuria and frequency.  Musculoskeletal: Positive for back pain.  Neurological: Negative.   Psychiatric/Behavioral: Positive for agitation.  Per HPI unless specifically indicated above     Objective:    BP 109/77   Pulse (!) 103   Temp 98.3 F (36.8 C)   Wt 204 lb (92.5 kg)   LMP 11/22/2015 (Approximate)   SpO2 100%   BMI 37.31 kg/m   Wt Readings from Last 3 Encounters:  10/19/16 205 lb 11.2 oz (93.3 kg)  10/14/16 204 lb (92.5 kg)  09/21/16 208 lb 11.2 oz (94.7 kg)    Physical Exam  Constitutional: She is oriented to person, place, and time. She appears well-developed and well-nourished. No distress.  HENT:  Head: Atraumatic.  Eyes: Conjunctivae are normal. Pupils are equal, round, and reactive to light.  Neck: Normal range of motion. Neck supple.  Cardiovascular: Normal rate and normal heart sounds.   Pulmonary/Chest: Effort normal and breath sounds normal. No respiratory  distress.  Abdominal: Soft. Bowel sounds are normal.  Musculoskeletal: Normal range of motion.  Neurological: She is alert and oriented to person, place, and time.  Skin: Skin is warm and dry.  Psychiatric: Thought content normal.  tearful  Nursing note and vitals reviewed.     Assessment & Plan:   Problem List Items Addressed This Visit      Other   Depression    Continue d/c of wellbutrin, given poor response to multiple SSRIs in the past will try her on Trintellix. Will also start seroquel for nighttime to see if that helps with her sleep at all. Risks and benefits of these medications discussed.       Relevant Medications   vortioxetine HBr (TRINTELLIX) 10 MG TABS   Anxiety - Primary    Continue xanax BID prn. Discussed counseling, pt will look into it.       Relevant Medications   vortioxetine HBr (TRINTELLIX) 10 MG TABS   Other Relevant Orders   Ambulatory referral to Psychology    Other Visit Diagnoses    Routine screening for STI (sexually transmitted infection)       Await results. Will not repeat U/A as she just had one done this morning.    Relevant Orders   GC/Chlamydia Probe Amp (Completed)   HIV antibody (Completed)   HSV(herpes simplex vrs) 1+2 ab-IgG (Completed)   RPR (Completed)   Hepatitis C antibody (Completed)   Acute bilateral low back pain with left-sided sciatica       Ibuprofen, tylenol, epsom salf soaks, stretches. Take is easy with workouts for a few weeks.        Follow up plan: Return in about 4 weeks (around 11/11/2016) for Medication management.

## 2016-10-14 NOTE — Telephone Encounter (Signed)
Her insurance will not cover Trintellix. She has to try 2 of the following: Wellbutrin, Paroxetine, Fluoxetine, Citalopram, Sertraline, Escitalopram, Mirtazapine, Venlafaxine ER. She has tried and failed Wellbutrin but needs to try another one of the listed meds first.

## 2016-10-14 NOTE — Telephone Encounter (Signed)
She has tried multiple others, see today's office visit

## 2016-10-15 ENCOUNTER — Other Ambulatory Visit: Payer: Self-pay | Admitting: Family Medicine

## 2016-10-15 LAB — HSV(HERPES SIMPLEX VRS) I + II AB-IGG
HSV 1 Glycoprotein G Ab, IgG: <0.91 index (ref 0.00–0.90)
HSV 2 Glycoprotein G Ab, IgG: <0.91 index (ref 0.00–0.90)

## 2016-10-15 LAB — HIV ANTIBODY (ROUTINE TESTING W REFLEX): HIV SCREEN 4TH GENERATION: NONREACTIVE

## 2016-10-15 LAB — HEPATITIS C ANTIBODY: Hep C Virus Ab: <0.1 s/co ratio (ref 0.0–0.9)

## 2016-10-15 LAB — RPR: RPR: NONREACTIVE

## 2016-10-15 MED ORDER — QUETIAPINE FUMARATE 25 MG PO TABS: 25.0000 mg | ORAL_TABLET | Freq: Every day | ORAL | 3 refills | Status: DC

## 2016-10-15 NOTE — Telephone Encounter (Signed)
Nothing received on this patient, but sent PA on covermymeds with medications patient has tried and failed.

## 2016-10-16 ENCOUNTER — Encounter: Payer: Self-pay | Admitting: Family Medicine

## 2016-10-16 LAB — GC/CHLAMYDIA PROBE AMP
Chlamydia trachomatis, NAA: NEGATIVE
Neisseria gonorrhoeae by PCR: NEGATIVE

## 2016-10-19 ENCOUNTER — Encounter: Payer: Self-pay | Admitting: Family Medicine

## 2016-10-19 ENCOUNTER — Ambulatory Visit (INDEPENDENT_AMBULATORY_CARE_PROVIDER_SITE_OTHER): Payer: 59 | Admitting: Obstetrics and Gynecology

## 2016-10-19 VITALS — BP 111/87 | HR 81 | Ht 62.0 in | Wt 205.7 lb

## 2016-10-19 DIAGNOSIS — E669 Obesity, unspecified: Secondary | ICD-10-CM | POA: Diagnosis not present

## 2016-10-19 MED ORDER — CYANOCOBALAMIN 1000 MCG/ML IJ SOLN
1000.0000 ug | Freq: Once | INTRAMUSCULAR | Status: AC
  Administered 2016-10-19: 1000 ug via INTRAMUSCULAR

## 2016-10-19 NOTE — Progress Notes (Signed)
Patient ID: Laura [name], female   DOB: 1986/02/10, 31 y.o.   MRN: [id number] Pt presents for weight, B/P, B-12 injection. No side effects of medication-Phentermine, or B-12.  Weight loss of 3 lbs. Encouraged eating healthy and exercise. Pt states she has not been eating as she should all the time. Getting back on track.

## 2016-10-21 ENCOUNTER — Encounter: Payer: Self-pay | Admitting: Family Medicine

## 2016-10-22 ENCOUNTER — Encounter: Payer: Self-pay | Admitting: Family Medicine

## 2016-10-22 ENCOUNTER — Other Ambulatory Visit: Payer: Self-pay | Admitting: Family Medicine

## 2016-10-22 DIAGNOSIS — F331 Major depressive disorder, recurrent, moderate: Secondary | ICD-10-CM

## 2016-10-22 NOTE — Assessment & Plan Note (Signed)
Continue d/c of wellbutrin, given poor response to multiple SSRIs in the past will try her on Trintellix. Will also start seroquel for nighttime to see if that helps with her sleep at all. Risks and benefits of these medications discussed.

## 2016-10-22 NOTE — Telephone Encounter (Signed)
Routing to provider  

## 2016-10-22 NOTE — Patient Instructions (Signed)
Follow up in 1 month   

## 2016-10-22 NOTE — Assessment & Plan Note (Addendum)
Continue xanax BID prn. Discussed counseling, pt will look into it.

## 2016-10-23 ENCOUNTER — Other Ambulatory Visit: Payer: Self-pay | Admitting: Family Medicine

## 2016-10-23 MED ORDER — BUPROPION HCL ER (XL) 150 MG PO TB24: 150.0000 mg | ORAL_TABLET | Freq: Every day | ORAL | 1 refills | Status: DC

## 2016-10-26 NOTE — Progress Notes (Signed)
Patient ID: Laura [name], female   DOB: 1986/05/11, 31 y.o.   MRN: [id number] ANNUAL PREVENTATIVE CARE GYN  ENCOUNTER NOTE  Subjective:       Laura [name] is a 31 y.o. G29P2003 female here for a routine annual gynecologic exam.  Current complaints: 1. Decreased libido -since hyst  Patient reports no significant vasomotor symptoms. She has no issues with vaginal lubrication. Success with orgasm is difficult. 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 She does experience chronic fatigue with managing these life issues.  Bowel function is normal with the use of Colace stool softener She reports no significant bladder issues  Gynecologic History Patient's last menstrual period was 11/22/2015 (approximate). Contraception: tubal ligation- s/p lavh Last Pap: 09/2015 neg/neg/neg. Results were: normal Last mammogram: N/A. Results were: N/A  Obstetric History OB History  Gravida Para Term Preterm AB Living  2 2 2     3   SAB TAB Ectopic Multiple Live Births        1 3    # Outcome Date GA Lbr Len/2nd Weight Sex Delivery Anes PTL Lv  2A Gravida 2010   4 lb 2.1 oz (1.873 kg) F CS-LTranv   LIV  2B Term 2010   4 lb 4.8 oz (1.95 kg) F CS-LTranv   LIV  1 Term 2009   7 lb 14.4 oz (3.583 kg) M Vag-Spont   LIV      Past Medical History:  Diagnosis Date  . Anxiety   . Complication of anesthesia    "woke up during surgery"  . Depression   . Heavy periods   . Incontinence of urine   . Increased BMI   . Indigestion   . PTSD (post-traumatic stress disorder)   . Tobacco user     Past Surgical History:  Procedure Laterality Date  . CESAREAN SECTION  2010  . LAPAROSCOPIC VAGINAL HYSTERECTOMY WITH SALPINGECTOMY Bilateral 12/16/2015   Procedure: LAPAROSCOPIC ASSISTED VAGINAL HYSTERECTOMY WITH BILATERAL SALPINGECTOMY;  Surgeon: Brayton Mars, MD;  Location: [facility] ORS;  Service:  Gynecology;  Laterality: Bilateral;  . TUBAL LIGATION  2011    Current Outpatient Prescriptions on File Prior to Visit  Medication Sig Dispense Refill  . ALPRAZolam (XANAX) 1 MG tablet Take 1 tablet (1 mg total) by mouth 2 (two) times daily as needed for anxiety. 60 tablet 0  . BIOTIN PO Take by mouth daily.     Marland Kitchen buPROPion (WELLBUTRIN XL) 150 MG 24 hr tablet Take 1 tablet (150 mg total) by mouth daily. 30 tablet 1  . cyanocobalamin (,VITAMIN B-12,) 1000 MCG/ML injection Inject 1 mL (1,000 mcg total) into the muscle every 30 (thirty) days. 10 mL 1  . Multiple Vitamin (MULTIVITAMIN) capsule Take 1 capsule by mouth daily.    . phentermine (ADIPEX-P) 37.5 MG tablet Take 1 tablet (37.5 mg total) by mouth daily before breakfast. 30 tablet 2  . QUEtiapine (SEROQUEL) 25 MG tablet Take 1 tablet (25 mg total) by mouth at bedtime. 30 tablet 3  . vortioxetine HBr (TRINTELLIX) 10 MG TABS Take 1 tablet (10 mg total) by mouth daily. 30 tablet 1   No current facility-administered medications on file prior to visit.     Allergies  Allergen Reactions  . Amoxicillin Rash and Other (See Comments)    GI upset    Social History   Social History  . Marital status: Married    Spouse name:  N/A  . Number of children: N/A  . Years of education: N/A   Occupational History  . Not on file.   Social History Main Topics  . Smoking status: Current Every Day Smoker    Packs/day: 0.25    Types: Cigarettes  . Smokeless tobacco: Never Used  . Alcohol use 0.0 oz/week     Comment: occasionally; beer  . Drug use: No  . Sexual activity: Yes    Birth control/ protection: Surgical   Other Topics Concern  . Not on file   Social History Narrative  . No narrative on file    Family History  Problem Relation Age of Onset  . Mental illness Mother   . Diabetes Maternal Grandmother   . Heart disease Maternal Grandfather   . Cancer Neg Hx     The following portions of the patient's history were reviewed  and updated as appropriate: allergies, current medications, past family history, past medical history, past social history, past surgical history and problem list.  Review of Systems Review of Systems  Constitutional: Negative.   HENT: Negative.   Eyes: Negative.   Respiratory: Negative.   Cardiovascular: Negative.   Gastrointestinal: Positive for constipation.  Genitourinary: Negative.   Musculoskeletal: Negative.   Skin: Negative.   Neurological: Negative.   Psychiatric/Behavioral: Negative.      Objective:   LMP 11/22/2015 (Approximate)  BP 112/78   Pulse 97   Ht 5\' 2"  (1.575 m)   Wt 206 lb (93.4 kg)   LMP 11/22/2015 (Approximate)   BMI 37.68 kg/m   CONSTITUTIONAL: Well-developed, obese female in no acute distress.  PSYCHIATRIC: Normal mood and affect. Normal behavior. Normal judgment and thought content. [place]: Alert and oriented to person, place, and time. Normal muscle tone coordination. No cranial nerve deficit noted. HENT:  Normocephalic, atraumatic, External right and left ear normal. Oropharynx is clear and moist EYES: Conjunctivae and EOM are normal. Pupils are equal, round, and reactive to light. No scleral icterus.  NECK: Normal range of motion, supple, no masses.  Normal thyroid.  SKIN: Skin is warm and dry. No rash noted. Not diaphoretic. No erythema. No pallor. CARDIOVASCULAR: Normal heart rate noted, regular rhythm, no murmur. RESPIRATORY: Clear to auscultation bilaterally. Effort and breath sounds normal, no problems with respiration noted. BREASTS: Symmetric in size. No masses, skin changes, nipple drainage, or lymphadenopathy. ABDOMEN: Soft, normal bowel sounds, no distention noted.  No tenderness, rebound or guarding. Pfannenstiel incision well-healed BLADDER: Normal PELVIC:  External Genitalia: Normal  BUS: Normal  Vagina: Normal with good estrogen effect  Cervix: Surgically absent  Uterus: Surgically absent  Adnexa: Normal; not palpable and  nontender  RV: Not examined and External Exam NormaI  MUSCULOSKELETAL: Normal range of motion. No tenderness.  No cyanosis, clubbing, or edema.  2+ distal pulses. LYMPHATIC: No Axillary, Supraclavicular, or Inguinal Adenopathy.    Assessment:   Annual gynecologic examination 31 y.o. Contraception: tubal ligation BMI-37 Problem List Items Addressed This Visit    Hot flashes    Other Visit Diagnoses    Well woman exam with routine gynecological exam    -  Primary   History of cesarean section       Status post laparoscopic assisted vaginal hysterectomy (LAVH)       Obesity (BMI 30-39.9)        Decreased libido  Plan:  Pap: not needed Mammogram: Not Indicated Stool Guaiac Testing:  Not Indicated Labs: vit d tsh a1c lipid fbs Routine preventative health maintenance measures emphasized:  Exercise/Diet/Weight control, Tobacco Warnings and Alcohol/Substance use risks  Discussed contributing factors to libido in women, as well as strategies to manage these factors Return to [place], CMA  Brayton Mars, MD    I have seen, interviewed, and examined the patient in conjunction with the [place].A. student and affirm the diagnosis and management plan. Martin A. DeFrancesco, MD, FACOG   Note: This dictation was prepared with Dragon dictation along with smaller phrase technology. Any transcriptional errors that result from this process are unintentional.

## 2016-10-28 ENCOUNTER — Encounter: Payer: Self-pay | Admitting: Obstetrics and Gynecology

## 2016-10-28 ENCOUNTER — Ambulatory Visit (INDEPENDENT_AMBULATORY_CARE_PROVIDER_SITE_OTHER): Payer: 59 | Admitting: Obstetrics and Gynecology

## 2016-10-28 VITALS — BP 112/78 | HR 97 | Ht 62.0 in | Wt 206.0 lb

## 2016-10-28 DIAGNOSIS — Z01419 Encounter for gynecological examination (general) (routine) without abnormal findings: Secondary | ICD-10-CM | POA: Diagnosis not present

## 2016-10-28 DIAGNOSIS — Z98891 History of uterine scar from previous surgery: Secondary | ICD-10-CM | POA: Diagnosis not present

## 2016-10-28 DIAGNOSIS — E669 Obesity, unspecified: Secondary | ICD-10-CM | POA: Insufficient documentation

## 2016-10-28 DIAGNOSIS — Z90710 Acquired absence of both cervix and uterus: Secondary | ICD-10-CM | POA: Diagnosis not present

## 2016-10-28 DIAGNOSIS — R6882 Decreased libido: Secondary | ICD-10-CM | POA: Insufficient documentation

## 2016-10-28 DIAGNOSIS — R232 Flushing: Secondary | ICD-10-CM | POA: Diagnosis not present

## 2016-10-28 NOTE — Patient Instructions (Signed)
1. No Pap smear needed 2. Self breast weight is encouraged 3. Continue with healthy eating, exercise, and control weight loss 4. Risk factors for her decreased libido in women were reviewed 5. Return in 1 year for annual exam 6. Smoking cessation encouraged  Health Maintenance, Female Adopting a healthy lifestyle and getting preventive care can go a long way to promote health and wellness. Talk with your health care provider about what schedule of regular examinations is right for you. This is a good chance for you to check in with your provider about disease prevention and staying healthy. In between checkups, there are plenty of things you can do on your own. Experts have done a lot of research about which lifestyle changes and preventive measures are most likely to keep you healthy. Ask your health care provider for more information. Weight and diet Eat a healthy diet  Be sure to include plenty of vegetables, fruits, low-fat dairy products, and lean protein.  Do not eat a lot of foods high in solid fats, added sugars, or salt.  Get regular exercise. This is one of the most important things you can do for your health.  Most adults should exercise for at least 150 minutes each week. The exercise should increase your heart rate and make you sweat (moderate-intensity exercise).  Most adults should also do strengthening exercises at least twice a week. This is in addition to the moderate-intensity exercise. Maintain a healthy weight  Body mass index (BMI) is a measurement that can be used to identify possible weight problems. It estimates body fat based on height and weight. Your health care provider can help determine your BMI and help you achieve or maintain a healthy weight.  For females 68 years of age and older:  A BMI below 18.5 is considered underweight.  A BMI of 18.5 to 24.9 is normal.  A BMI of 25 to 29.9 is considered overweight.  A BMI of 30 and above is considered  obese. Watch levels of cholesterol and blood lipids  You should start having your blood tested for lipids and cholesterol at 31 years of age, then have this test every 5 years.  You may need to have your cholesterol levels checked more often if:  Your lipid or cholesterol levels are high.  You are older than 31 years of age.  You are at high risk for heart disease. Cancer screening Lung Cancer  Lung cancer screening is recommended for adults 60-86 years old who are at high risk for lung cancer because of a history of smoking.  A yearly low-dose CT scan of the lungs is recommended for people who:  Currently smoke.  Have quit within the past 15 years.  Have at least a 30-pack-year history of smoking. A pack year is smoking an average of one pack of cigarettes a day for 1 year.  Yearly screening should continue until it has been 15 years since you quit.  Yearly screening should stop if you develop a health problem that would prevent you from having lung cancer treatment. Breast Cancer  Practice breast self-awareness. This means understanding how your breasts normally appear and feel.  It also means doing regular breast self-exams. Let your health care provider know about any changes, no matter how small.  If you are in your 20s or 30s, you should have a clinical breast exam (CBE) by a health care provider every 1-3 years as part of a regular health exam.  If you are 40 or  older, have a CBE every year. Also consider having a breast X-ray (mammogram) every year.  If you have a family history of breast cancer, talk to your health care provider about genetic screening.  If you are at high risk for breast cancer, talk to your health care provider about having an MRI and a mammogram every year.  Breast cancer gene (BRCA) assessment is recommended for women who have family members with BRCA-related cancers. BRCA-related cancers include:  Breast.  Ovarian.  Tubal.  Peritoneal  cancers.  Results of the assessment will determine the need for genetic counseling and BRCA1 and BRCA2 testing. Cervical Cancer  Your health care provider may recommend that you be screened regularly for cancer of the pelvic organs (ovaries, uterus, and vagina). This screening involves a pelvic examination, including checking for microscopic changes to the surface of your cervix (Pap test). You may be encouraged to have this screening done every 3 years, beginning at age 64.  For women ages 24-65, health care providers may recommend pelvic exams and Pap testing every 3 years, or they may recommend the Pap and pelvic exam, combined with testing for human papilloma virus (HPV), every 5 years. Some types of HPV increase your risk of cervical cancer. Testing for HPV may also be done on women of any age with unclear Pap test results.  Other health care providers may not recommend any screening for nonpregnant women who are considered low risk for pelvic cancer and who do not have symptoms. Ask your health care provider if a screening pelvic exam is right for you.  If you have had past treatment for cervical cancer or a condition that could lead to cancer, you need Pap tests and screening for cancer for at least 20 years after your treatment. If Pap tests have been discontinued, your risk factors (such as having a new sexual partner) need to be reassessed to determine if screening should resume. Some women have medical problems that increase the chance of getting cervical cancer. In these cases, your health care provider may recommend more frequent screening and Pap tests. Colorectal Cancer  This type of cancer can be detected and often prevented.  Routine colorectal cancer screening usually begins at 31 years of age and continues through 31 years of age.  Your health care provider may recommend screening at an earlier age if you have risk factors for colon cancer.  Your health care provider may also  recommend using home test kits to check for hidden blood in the stool.  A small camera at the end of a tube can be used to examine your colon directly (sigmoidoscopy or colonoscopy). This is done to check for the earliest forms of colorectal cancer.  Routine screening usually begins at age 70.  Direct examination of the colon should be repeated every 5-10 years through 31 years of age. However, you may need to be screened more often if early forms of precancerous polyps or small growths are found. Skin Cancer  Check your skin from head to toe regularly.  Tell your health care provider about any new moles or changes in moles, especially if there is a change in a mole's shape or color.  Also tell your health care provider if you have a mole that is larger than the size of a pencil eraser.  Always use sunscreen. Apply sunscreen liberally and repeatedly throughout the day.  Protect yourself by wearing long sleeves, pants, a wide-brimmed hat, and sunglasses whenever you are outside. Heart disease,  diabetes, and high blood pressure  High blood pressure causes heart disease and increases the risk of stroke. High blood pressure is more likely to develop in:  People who have blood pressure in the high end of the normal range (130-139/85-89 mm Hg).  People who are overweight or obese.  People who are African American.  If you are 70-110 years of age, have your blood pressure checked every 3-5 years. If you are 28 years of age or older, have your blood pressure checked every year. You should have your blood pressure measured twice-once when you are at a hospital or clinic, and once when you are not at a hospital or clinic. Record the average of the two measurements. To check your blood pressure when you are not at a hospital or clinic, you can use:  An automated blood pressure machine at a pharmacy.  A home blood pressure monitor.  If you are between 3 years and 70 years old, ask your health  care provider if you should take aspirin to prevent strokes.  Have regular diabetes screenings. This involves taking a blood sample to check your fasting blood sugar level.  If you are at a normal weight and have a low risk for diabetes, have this test once every three years after 31 years of age.  If you are overweight and have a high risk for diabetes, consider being tested at a younger age or more often. Preventing infection Hepatitis B  If you have a higher risk for hepatitis B, you should be screened for this virus. You are considered at high risk for hepatitis B if:  You were born in a country where hepatitis B is common. Ask your health care provider which countries are considered high risk.  Your parents were born in a high-risk country, and you have not been immunized against hepatitis B (hepatitis B vaccine).  You have HIV or AIDS.  You use needles to inject street drugs.  You live with someone who has hepatitis B.  You have had sex with someone who has hepatitis B.  You get hemodialysis treatment.  You take certain medicines for conditions, including cancer, organ transplantation, and autoimmune conditions. Hepatitis C  Blood testing is recommended for:  Everyone born from [birth year] through 1965.  Anyone with known risk factors for hepatitis C. Sexually transmitted infections (STIs)  You should be screened for sexually transmitted infections (STIs) including gonorrhea and chlamydia if:  You are sexually active and are younger than 31 years of age.  You are older than 31 years of age and your health care provider tells you that you are at risk for this type of infection.  Your sexual activity has changed since you were last screened and you are at an increased risk for chlamydia or gonorrhea. Ask your health care provider if you are at risk.  If you do not have HIV, but are at risk, it may be recommended that you take a prescription medicine daily to prevent HIV  infection. This is called pre-exposure prophylaxis (PrEP). You are considered at risk if:  You are sexually active and do not regularly use condoms or know the HIV status of your partner(s).  You take drugs by injection.  You are sexually active with a partner who has HIV. Talk with your health care provider about whether you are at high risk of being infected with HIV. If you choose to begin PrEP, you should first be tested for HIV. You should then be tested  every 3 months for as long as you are taking PrEP. Pregnancy  If you are premenopausal and you may become pregnant, ask your health care provider about preconception counseling.  If you may become pregnant, take 400 to 800 micrograms (mcg) of folic acid every day.  If you want to prevent pregnancy, talk to your health care provider about birth control (contraception). Osteoporosis and menopause  Osteoporosis is a disease in which the bones lose minerals and strength with aging. This can result in serious bone fractures. Your risk for osteoporosis can be identified using a bone density scan.  If you are 21 years of age or older, or if you are at risk for osteoporosis and fractures, ask your health care provider if you should be screened.  Ask your health care provider whether you should take a calcium or vitamin D supplement to lower your risk for osteoporosis.  Menopause may have certain physical symptoms and risks.  Hormone replacement therapy may reduce some of these symptoms and risks. Talk to your health care provider about whether hormone replacement therapy is right for you. Follow these instructions at home:  Schedule regular health, dental, and eye exams.  Stay current with your immunizations.  Do not use any tobacco products including cigarettes, chewing tobacco, or electronic cigarettes.  If you are pregnant, do not drink alcohol.  If you are breastfeeding, limit how much and how often you drink alcohol.  Limit  alcohol intake to no more than 1 drink per day for nonpregnant women. One drink equals 12 ounces of beer, 5 ounces of wine, or 1 ounces of hard liquor.  Do not use street drugs.  Do not share needles.  Ask your health care provider for help if you need support or information about quitting drugs.  Tell your health care provider if you often feel depressed.  Tell your health care provider if you have ever been abused or do not feel safe at home. This information is not intended to replace advice given to you by your health care provider. Make sure you discuss any questions you have with your health care provider. Document Released: 12/29/2010 Document Revised: 11/21/2015 Document Reviewed: 03/19/2015 Elsevier Interactive Patient Education  2017 [employer].

## 2016-10-30 ENCOUNTER — Encounter: Payer: Self-pay | Admitting: Physician Assistant

## 2016-10-30 ENCOUNTER — Ambulatory Visit: Payer: Self-pay | Admitting: Physician Assistant

## 2016-10-30 VITALS — BP 102/82 | HR 108 | Temp 98.6°F

## 2016-10-30 DIAGNOSIS — R110 Nausea: Secondary | ICD-10-CM

## 2016-10-30 DIAGNOSIS — R300 Dysuria: Secondary | ICD-10-CM

## 2016-10-30 DIAGNOSIS — G8929 Other chronic pain: Secondary | ICD-10-CM

## 2016-10-30 DIAGNOSIS — M545 Low back pain: Secondary | ICD-10-CM

## 2016-10-30 DIAGNOSIS — R1032 Left lower quadrant pain: Secondary | ICD-10-CM

## 2016-10-30 LAB — POCT URINALYSIS DIPSTICK
BILIRUBIN UA: NEGATIVE
GLUCOSE UA: NEGATIVE
KETONES UA: NEGATIVE
Leukocytes, UA: NEGATIVE
Nitrite, UA: NEGATIVE
PH UA: 6.5 (ref 5.0–8.0)
Protein, UA: NEGATIVE
Spec Grav, UA: 1.02 (ref 1.010–1.025)
Urobilinogen, UA: 4 E.U./dL — AB

## 2016-10-30 MED ORDER — ONDANSETRON HCL 4 MG PO TABS: 4.0000 mg | ORAL_TABLET | Freq: Three times a day (TID) | ORAL | 0 refills | Status: DC | PRN

## 2016-10-30 MED ORDER — BACLOFEN 10 MG PO TABS: 10.0000 mg | ORAL_TABLET | Freq: Three times a day (TID) | ORAL | 0 refills | Status: DC

## 2016-10-30 MED ORDER — METHYLPREDNISOLONE 4 MG PO TBPK: ORAL_TABLET | ORAL | 0 refills | Status: DC

## 2016-10-30 NOTE — Progress Notes (Signed)
S: c/o low back pain for 2 weeks, not like her normal sciatica, then today she woke up with nausea and fever, no v but does have loose stools; ?if uti, no uti sx except frequency, no hx of ovarian cysts, +hysterectomy, no vag discharge, no known back injury, took an old tramadol for pain and a leftover zofran for nausea  O: vitals wnl, pt's cheeks are warm and pink, lungs c t a, cv rrr, abd soft tender in llq, bs normal, ua wnl  A: nausea, back pain, fever  P: most likely nausea and fever are from a GI virus that is prevalent in the community, back pain is chronic, rx for zofran, baclofen, medrol dose pack, work note given as pt should not be around patients at this time

## 2016-11-04 ENCOUNTER — Other Ambulatory Visit: Payer: Self-pay

## 2016-11-05 ENCOUNTER — Ambulatory Visit (INDEPENDENT_AMBULATORY_CARE_PROVIDER_SITE_OTHER): Payer: 59 | Admitting: Behavioral Health

## 2016-11-05 ENCOUNTER — Other Ambulatory Visit: Payer: 59

## 2016-11-05 DIAGNOSIS — E669 Obesity, unspecified: Secondary | ICD-10-CM | POA: Diagnosis not present

## 2016-11-05 DIAGNOSIS — F4310 Post-traumatic stress disorder, unspecified: Secondary | ICD-10-CM

## 2016-11-05 DIAGNOSIS — F331 Major depressive disorder, recurrent, moderate: Secondary | ICD-10-CM | POA: Diagnosis not present

## 2016-11-05 DIAGNOSIS — Z01419 Encounter for gynecological examination (general) (routine) without abnormal findings: Secondary | ICD-10-CM | POA: Diagnosis not present

## 2016-11-06 ENCOUNTER — Other Ambulatory Visit: Payer: Self-pay

## 2016-11-06 ENCOUNTER — Other Ambulatory Visit: Payer: Self-pay | Admitting: Family Medicine

## 2016-11-06 DIAGNOSIS — R7989 Other specified abnormal findings of blood chemistry: Secondary | ICD-10-CM

## 2016-11-06 LAB — LIPID PANEL
CHOLESTEROL TOTAL: 159 mg/dL (ref 100–199)
Chol/HDL Ratio: 3 ratio (ref 0.0–4.4)
HDL: 53 mg/dL (ref >39)
LDL Calculated: 100 mg/dL — ABNORMAL HIGH (ref 0–99)
Triglycerides: 32 mg/dL (ref 0–149)
VLDL CHOLESTEROL CAL: 6 mg/dL (ref 5–40)

## 2016-11-06 LAB — HEMOGLOBIN A1C
ESTIMATED AVERAGE GLUCOSE: 108 mg/dL
Hgb A1c MFr Bld: 5.4 % (ref 4.8–5.6)

## 2016-11-06 LAB — TSH: TSH: 0.398 u[IU]/mL — ABNORMAL LOW (ref 0.450–4.500)

## 2016-11-06 LAB — GLUCOSE, RANDOM: Glucose: 87 mg/dL (ref 65–99)

## 2016-11-06 LAB — VITAMIN D 25 HYDROXY (VIT D DEFICIENCY, FRACTURES): Vit D, 25-Hydroxy: 31.7 ng/mL (ref 30.0–100.0)

## 2016-11-09 MED ORDER — ALPRAZOLAM 1 MG PO TABS: 1.0000 mg | ORAL_TABLET | Freq: Two times a day (BID) | ORAL | 0 refills | Status: DC | PRN

## 2016-11-10 ENCOUNTER — Encounter: Payer: Self-pay | Admitting: Behavioral Health

## 2016-11-10 ENCOUNTER — Encounter: Payer: Self-pay | Admitting: Family Medicine

## 2016-11-10 ENCOUNTER — Other Ambulatory Visit: Payer: Self-pay | Admitting: Family Medicine
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
## 2016-11-11 ENCOUNTER — Ambulatory Visit: Payer: 59 | Admitting: Behavioral Health

## 2016-11-11 ENCOUNTER — Ambulatory Visit (INDEPENDENT_AMBULATORY_CARE_PROVIDER_SITE_OTHER): Payer: 59 | Admitting: Behavioral Health

## 2016-11-11 ENCOUNTER — Encounter: Payer: Self-pay | Admitting: Behavioral Health

## 2016-11-11 DIAGNOSIS — F331 Major depressive disorder, recurrent, moderate: Secondary | ICD-10-CM

## 2016-11-11 DIAGNOSIS — F4310 Post-traumatic stress disorder, unspecified: Secondary | ICD-10-CM
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
## 2016-11-18 ENCOUNTER — Ambulatory Visit (INDEPENDENT_AMBULATORY_CARE_PROVIDER_SITE_OTHER): Payer: 59 | Admitting: Behavioral Health

## 2016-11-18 ENCOUNTER — Encounter: Payer: Self-pay | Admitting: Behavioral Health

## 2016-11-18 DIAGNOSIS — F4310 Post-traumatic stress disorder, unspecified: Secondary | ICD-10-CM | POA: Diagnosis not present
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
## 2016-11-24 ENCOUNTER — Ambulatory Visit (INDEPENDENT_AMBULATORY_CARE_PROVIDER_SITE_OTHER): Payer: 59 | Admitting: Psychiatry

## 2016-11-24 ENCOUNTER — Encounter: Payer: Self-pay | Admitting: Psychiatry

## 2016-11-24 VITALS — BP 113/78 | HR 96 | Temp 98.4°F | Wt 204.0 lb

## 2016-11-24 DIAGNOSIS — F331 Major depressive disorder, recurrent, moderate: Secondary | ICD-10-CM | POA: Diagnosis not present

## 2016-11-24 DIAGNOSIS — F4310 Post-traumatic stress disorder, unspecified: Secondary | ICD-10-CM

## 2016-11-24 MED ORDER — TRAZODONE HCL 50 MG PO TABS: 50.0000 mg | ORAL_TABLET | Freq: Every day | ORAL | 1 refills | Status: DC

## 2016-11-24 MED ORDER — LURASIDONE HCL 20 MG PO TABS: 20.0000 mg | ORAL_TABLET | Freq: Every day | ORAL | 1 refills | Status: DC
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
## 2016-11-25 ENCOUNTER — Other Ambulatory Visit: Payer: Self-pay

## 2016-11-25 ENCOUNTER — Ambulatory Visit: Payer: 59 | Admitting: Family Medicine

## 2016-11-26 ENCOUNTER — Ambulatory Visit: Payer: Self-pay | Admitting: Physician Assistant

## 2016-11-26 ENCOUNTER — Encounter: Payer: Self-pay | Admitting: Physician Assistant

## 2016-11-26 ENCOUNTER — Ambulatory Visit
Admission: RE | Admit: 2016-11-26 | Discharge: 2016-11-26 | Disposition: A | Discharge status: Home / Self Care | Payer: 59 | Source: Ambulatory Visit | Attending: Physician Assistant | Admitting: Physician Assistant

## 2016-11-26 ENCOUNTER — Ambulatory Visit: Payer: 59 | Admitting: Behavioral Health

## 2016-11-26 VITALS — BP 124/84 | HR 76 | Temp 98.4°F

## 2016-11-26 DIAGNOSIS — X58XXXA Exposure to other specified factors, initial encounter: Secondary | ICD-10-CM | POA: Diagnosis not present

## 2016-11-26 DIAGNOSIS — S99922A Unspecified injury of left foot, initial encounter: Secondary | ICD-10-CM | POA: Diagnosis not present

## 2016-11-26 DIAGNOSIS — M79672 Pain in left foot: Secondary | ICD-10-CM | POA: Diagnosis not present

## 2016-11-26 DIAGNOSIS — M7989 Other specified soft tissue disorders: Secondary | ICD-10-CM | POA: Insufficient documentation

## 2016-11-26 DIAGNOSIS — R200 Anesthesia of skin: Secondary | ICD-10-CM | POA: Insufficient documentation

## 2016-11-26 NOTE — Progress Notes (Signed)
S: c/o left foot pain, states she was walking the dog and tripped on the leash, twisted foot to the side and under, pain yesterday when it happened but has gotten worse since then, more swelling, toes are bruised, area along side of foot and toe feels numb, no other injury  O: vitals wnl, nad, skin intact, left foot is swollen along 4th and 5th metatarsals, tender in same area, 4th and 5th toes are tender, full rom, pain is reproduced while bearing weight, n/v intact  A: acute left foot pain  P: wooden shoe, ice, xray left foot, aleve 2 pills bid, if numbness continues through the weekend will refer to podiatry

## 2016-11-30 ENCOUNTER — Telehealth: Payer: Self-pay

## 2016-11-30 NOTE — Telephone Encounter (Signed)
pt called states that the latuda was denied by her insurance and she has not take anyting since she seen you a couple weeks ago . pt wanted to know if you would send in something else since latuda was not covered.

## 2016-12-01 NOTE — Telephone Encounter (Signed)
Since patient has been on multiple medications previously we will need to discuss the next medication in an appointment

## 2016-12-01 NOTE — Telephone Encounter (Signed)
ok 

## 2016-12-01 NOTE — Telephone Encounter (Signed)
called pt will leave a latuda saving card for $15 at front desk.  ask patient to try this card 1st to see if it will work if not we will discuss other option on next office visit.

## 2016-12-01 NOTE — Telephone Encounter (Signed)
Left message that you will discuss with patient at her next appt.

## 2016-12-02 ENCOUNTER — Encounter: Payer: Self-pay | Admitting: Family Medicine

## 2016-12-02 ENCOUNTER — Ambulatory Visit (INDEPENDENT_AMBULATORY_CARE_PROVIDER_SITE_OTHER): Payer: 59 | Admitting: Family Medicine

## 2016-12-02 ENCOUNTER — Ambulatory Visit: Payer: 59 | Admitting: Behavioral Health

## 2016-12-02 VITALS — BP 114/74 | HR 92 | Temp 98.4°F | Ht 62.0 in | Wt 202.5 lb

## 2016-12-02 DIAGNOSIS — J069 Acute upper respiratory infection, unspecified: Secondary | ICD-10-CM

## 2016-12-02 MED ORDER — HYDROCOD POLST-CPM POLST ER 10-8 MG/5ML PO SUER: 5.0000 mL | Freq: Two times a day (BID) | ORAL | 0 refills | Status: DC | PRN

## 2016-12-02 MED ORDER — AZITHROMYCIN 250 MG PO TABS: ORAL_TABLET | ORAL | 0 refills | Status: DC

## 2016-12-02 MED ORDER — FLUTICASONE PROPIONATE 50 MCG/ACT NA SUSP
2.0000 | Freq: Every day | NASAL | 6 refills | Status: DC
Start: 2016-12-02 — End: 2017-01-19

## 2016-12-02 MED ORDER — PREDNISONE 20 MG PO TABS: 40.0000 mg | ORAL_TABLET | Freq: Every day | ORAL | 0 refills | Status: DC

## 2016-12-02 NOTE — Progress Notes (Signed)
BP 114/74 (BP Location: Left Arm, Patient Position: Sitting, Cuff Size: Normal)   Pulse 92   Temp 98.4 F (36.9 C)   Ht 5\' 2"  (1.575 m)   Wt 202 lb 8 oz (91.9 kg)   LMP 11/22/2015 (Approximate)   SpO2 99%   BMI 37.04 kg/m    Subjective:    Patient ID: Laura [name], female    DOB: [birth year], 31 y.o.   MRN: [id number]  HPI: Laura [name] is a 31 y.o. female  Chief Complaint  Patient presents with  . Laura [name]    x's 3 days.  . Cough    Productive. Yellow in color. Has been taking Tessalon Pearls left over from a previous sickness.  . Fatigue   Patient with 4-5 days of sore throat, hacking cough, congestion, sinus pressure, fatigue, body aches, chills. Denies SOB, CP, fever, sweats, chest tightness. Taking OTC cough medication, mucinex, tessalon perles with no relief. Was traveling this past week and stood out in cold rain all weekend which is where she assumes she picked this up.   Relevant past medical, surgical, family and social history reviewed and updated as indicated. Interim medical history since our last visit reviewed. Allergies and medications reviewed and updated.  Review of Systems  Constitutional: Positive for chills and fatigue.  HENT: Positive for congestion, sinus pain, sinus pressure and sore throat.   Eyes: Negative.   Respiratory: Positive for cough and chest tightness.   Cardiovascular: Negative.   Gastrointestinal: Negative.   Genitourinary: Negative.   Musculoskeletal: Positive for myalgias.  Neurological: Negative.   Psychiatric/Behavioral: Negative.     Per HPI unless specifically indicated above     Objective:    BP 114/74 (BP Location: Left Arm, Patient Position: Sitting, Cuff Size: Normal)   Pulse 92   Temp 98.4 F (36.9 C)   Ht 5\' 2"  (1.575 m)   Wt 202 lb 8 oz (91.9 kg)   LMP 11/22/2015 (Approximate)   SpO2 99%   BMI 37.04 kg/m   Wt Readings from Last 3 Encounters:  12/02/16 202 lb 8 oz (91.9 kg)  10/28/16 206 lb  (93.4 kg)  10/19/16 205 lb 11.2 oz (93.3 kg)    Physical Exam  Constitutional: She is oriented to person, place, and time. She appears well-developed and well-nourished. No distress.  HENT:  Head: Atraumatic.  Nasal mucosa injected Oropharynx erythematous  Eyes: Conjunctivae are normal. Pupils are equal, round, and reactive to light. No scleral icterus.  Neck: Normal range of motion. Neck supple.  Cardiovascular: Normal rate and normal heart sounds.   Pulmonary/Chest: Effort normal and breath sounds normal. No respiratory distress.  Musculoskeletal: Normal range of motion.  Lymphadenopathy:    She has no cervical adenopathy.  Neurological: She is alert and oriented to person, place, and time.  Skin: Skin is warm and dry.  Psychiatric: She has a normal mood and affect. Her behavior is normal.  Nursing note and vitals reviewed.     Assessment & Plan:   Problem List Items Addressed This Visit    None    Visit Diagnoses    Upper respiratory tract infection, unspecified type    -  Primary   Prednisone, flonase, and tussionex sent. Continue tessalon and/or delsym. Zpack sent if worsening over the course of the week. Supportive care discussed.    Relevant Medications   azithromycin (ZITHROMAX) 250 MG tablet      Follow up plan: Return if symptoms worsen or fail to improve.

## 2016-12-03 ENCOUNTER — Ambulatory Visit: Payer: 59 | Admitting: Behavioral Health

## 2016-12-04 ENCOUNTER — Ambulatory Visit: Payer: 59 | Admitting: Psychiatry

## 2016-12-08 ENCOUNTER — Ambulatory Visit: Payer: 59 | Admitting: Psychiatry

## 2016-12-08 ENCOUNTER — Other Ambulatory Visit: Payer: Self-pay | Admitting: Family Medicine

## 2016-12-09 ENCOUNTER — Telehealth: Payer: Self-pay | Admitting: Family Medicine

## 2016-12-09 ENCOUNTER — Other Ambulatory Visit: Payer: Self-pay | Admitting: Family Medicine

## 2016-12-09 MED ORDER — ALPRAZOLAM 1 MG PO TABS: 1.0000 mg | ORAL_TABLET | Freq: Two times a day (BID) | ORAL | 0 refills | Status: DC | PRN

## 2016-12-09 NOTE — Telephone Encounter (Signed)
Patient notified

## 2016-12-09 NOTE — Telephone Encounter (Signed)
Left message to call.

## 2016-12-09 NOTE — Telephone Encounter (Signed)
Please call and let her know that the script has been sent for her xanax. Please let her know that she needs to talk to her Psychiatrist about managing this medication from now on and whether she needs to continue or try some other regimen - unclear from Psychiatry notes whether or not she is to still be taking it.

## 2016-12-10 ENCOUNTER — Ambulatory Visit: Payer: 59 | Admitting: Behavioral Health

## 2016-12-23 ENCOUNTER — Ambulatory Visit (INDEPENDENT_AMBULATORY_CARE_PROVIDER_SITE_OTHER): Payer: 59 | Admitting: Behavioral Health

## 2016-12-23 DIAGNOSIS — F4310 Post-traumatic stress disorder, unspecified: Secondary | ICD-10-CM | POA: Diagnosis not present

## 2016-12-23 DIAGNOSIS — F331 Major depressive disorder, recurrent, moderate: Secondary | ICD-10-CM

## 2016-12-24 ENCOUNTER — Encounter: Payer: Self-pay | Admitting: Behavioral Health
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
## 2016-12-28 ENCOUNTER — Encounter: Payer: Self-pay | Admitting: Obstetrics and Gynecology

## 2016-12-28 ENCOUNTER — Other Ambulatory Visit: Payer: Self-pay

## 2016-12-28 MED ORDER — FLUCONAZOLE 150 MG PO TABS: 150.0000 mg | ORAL_TABLET | Freq: Once | ORAL | 0 refills | Status: AC

## 2017-01-06 ENCOUNTER — Ambulatory Visit (INDEPENDENT_AMBULATORY_CARE_PROVIDER_SITE_OTHER): Payer: 59 | Admitting: Behavioral Health

## 2017-01-06 DIAGNOSIS — F4310 Post-traumatic stress disorder, unspecified: Secondary | ICD-10-CM | POA: Diagnosis not present

## 2017-01-06 DIAGNOSIS — F331 Major depressive disorder, recurrent, moderate: Secondary | ICD-10-CM

## 2017-01-13 ENCOUNTER — Encounter: Payer: Self-pay | Admitting: Behavioral Health

## 2017-01-13 ENCOUNTER — Ambulatory Visit (INDEPENDENT_AMBULATORY_CARE_PROVIDER_SITE_OTHER): Payer: 59 | Admitting: Behavioral Health

## 2017-01-13 DIAGNOSIS — F331 Major depressive disorder, recurrent, moderate: Secondary | ICD-10-CM | POA: Diagnosis not present

## 2017-01-13 DIAGNOSIS — F4310 Post-traumatic stress disorder, unspecified: Secondary | ICD-10-CM | POA: Diagnosis not present
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
## 2017-01-14 ENCOUNTER — Encounter: Payer: Self-pay | Admitting: Behavioral Health

## 2017-01-14 NOTE — Patient Instructions (Signed)
Referred out to other therapists in the are who specialize in trauma and DBT.
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
## 2017-01-19 ENCOUNTER — Telehealth: Payer: 59 | Admitting: Family

## 2017-01-19 DIAGNOSIS — J019 Acute sinusitis, unspecified: Secondary | ICD-10-CM

## 2017-01-19 DIAGNOSIS — B9689 Other specified bacterial agents as the cause of diseases classified elsewhere: Secondary | ICD-10-CM

## 2017-01-19 MED ORDER — FLUTICASONE PROPIONATE 50 MCG/ACT NA SUSP: 2.0000 | Freq: Every day | NASAL | 6 refills | Status: DC

## 2017-01-19 MED ORDER — DOXYCYCLINE HYCLATE 100 MG PO TABS: 100.0000 mg | ORAL_TABLET | Freq: Two times a day (BID) | ORAL | 0 refills | Status: DC

## 2017-01-19 NOTE — Progress Notes (Signed)
We are sorry that you are not feeling well.  Here is how we plan to help!  Based on what you have shared with me it looks like you have sinusitis.  Sinusitis is inflammation and infection in the sinus cavities of the head.  Based on your presentation I believe you most likely have Acute Bacterial Sinusitis.  This is an infection caused by bacteria and is treated with antibiotics. I have prescribed Doxycycline 100mg  by mouth twice a day for 10 days. You may use an oral decongestant such as Mucinex D or if you have glaucoma or high blood pressure use plain Mucinex. Saline nasal spray help and can safely be used as often as needed for congestion.  If you develop worsening sinus pain, fever or notice severe headache and vision changes, or if symptoms are not better after completion of antibiotic, please schedule an appointment with a health care provider.    Additionally, Flonase 2 sprays in each nostril once a day has been sent to the pharmacy.  Sinus infections are not as easily transmitted as other respiratory infection, however we still recommend that you avoid close contact with loved ones, especially the very young and elderly.  Remember to wash your hands thoroughly throughout the day as this is the number one way to prevent the spread of infection!  Home Care:  Only take medications as instructed by your medical team.  Complete the entire course of an antibiotic.  Do not take these medications with alcohol.  A steam or ultrasonic humidifier can help congestion.  You can place a towel over your head and breathe in the steam from hot water coming from a faucet.  Avoid close contacts especially the very young and the elderly.  Cover your mouth when you cough or sneeze.  Always remember to wash your hands.  Get Help Right Away If:  You develop worsening fever or sinus pain.  You develop a severe head ache or visual changes.  Your symptoms persist after you have completed your treatment  plan.  Make sure you  Understand these instructions.  Will watch your condition.  Will get help right away if you are not doing well or get worse.  Your e-visit answers were reviewed by a board certified advanced clinical practitioner to complete your personal care plan.  Depending on the condition, your plan could have included both over the counter or prescription medications.  If there is a problem please reply  once you have received a response from your provider.  Your safety is important to [place].  If you have drug allergies check your prescription carefully.    You can use MyChart to ask questions about today's visit, request a non-urgent call back, or ask for a work or school excuse for 24 hours related to this e-Visit. If it has been greater than 24 hours you will need to follow up with your provider, or enter a new e-Visit to address those concerns.  You will get an e-mail in the next two days asking about your experience.  I hope that your e-visit has been valuable and will speed your recovery. Thank you for using e-visits.

## 2017-01-20 MED ORDER — LEVOFLOXACIN 500 MG PO TABS: 500.0000 mg | ORAL_TABLET | Freq: Every day | ORAL | 0 refills | Status: DC

## 2017-01-20 NOTE — Progress Notes (Signed)
Patietn could not tolerate doxycycline rx yesterday fro sinusitis. Sent in new rx for levaquin 500mg  1 daily for 7 days.

## 2017-01-20 NOTE — Addendum Note (Signed)
Addended by: Chevis Pretty on: 01/20/2017 09:07 AM   Modules accepted: Orders

## 2017-02-08 ENCOUNTER — Other Ambulatory Visit: Payer: Self-pay

## 2017-03-02 ENCOUNTER — Encounter: Payer: Self-pay | Admitting: Family Medicine

## 2017-03-02 ENCOUNTER — Ambulatory Visit (INDEPENDENT_AMBULATORY_CARE_PROVIDER_SITE_OTHER): Payer: 59 | Admitting: Family Medicine

## 2017-03-02 ENCOUNTER — Telehealth: Payer: Self-pay

## 2017-03-02 VITALS — BP 111/78 | HR 92 | Temp 98.9°F | Wt 198.0 lb

## 2017-03-02 DIAGNOSIS — R946 Abnormal results of thyroid function studies: Secondary | ICD-10-CM | POA: Diagnosis not present

## 2017-03-02 DIAGNOSIS — R7989 Other specified abnormal findings of blood chemistry: Secondary | ICD-10-CM

## 2017-03-02 DIAGNOSIS — F419 Anxiety disorder, unspecified: Secondary | ICD-10-CM | POA: Diagnosis not present

## 2017-03-02 MED ORDER — ARIPIPRAZOLE 10 MG PO TABS: 10.0000 mg | ORAL_TABLET | Freq: Every day | ORAL | 0 refills | Status: DC

## 2017-03-02 MED ORDER — ALPRAZOLAM 1 MG PO TABS: 1.0000 mg | ORAL_TABLET | Freq: Two times a day (BID) | ORAL | 0 refills | Status: DC | PRN

## 2017-03-02 MED ORDER — LURASIDONE HCL 20 MG PO TABS: 20.0000 mg | ORAL_TABLET | Freq: Every day | ORAL | 1 refills | Status: DC

## 2017-03-02 NOTE — Progress Notes (Signed)
BP 111/78   Pulse 92   Temp 98.9 F (37.2 C)   Wt 198 lb (89.8 kg)   LMP 11/22/2015 (Approximate)   SpO2 98%   BMI 36.21 kg/m    Subjective:    Patient ID: Laura [name], female    DOB: [birth year], 31 y.o.   MRN: [id number]  HPI: Laura [name] is a 31 y.o. female  Chief Complaint  Patient presents with  . Anxiety    was going to [facility], provider she was seeing left, another one wrote her 2 others but insurance didn't cover them. She states the depression is better but anxiety is worsening.   Patient presents today for anxiety f/u. Has been off everything except for prn trazodone for several months. Taking 1/2 trazodone when needed, full trazodone has been causing daytime drowsiness. Was being seen by a Psychiatrist and a counselor for a time, but kept having appts cancelled and provider left. Psychiatrist sent in [place] for her to start taking after mutliple other failed medications previously, but it was too expensive. Having panic attacks 3-4 times, lots of life stress currently.   Also had an abnormal TSH at GYN recently, wanting recheck. No hx of thyroid dz, no abnormal sxs.   Past Medical History:  Diagnosis Date  . Anxiety   . Bipolar disorder ([place])    past hx of  . Complication of anesthesia    "woke up during surgery"  . Depression   . Heavy periods   . Incontinence of urine   . Increased BMI   . Indigestion   . PTSD (post-traumatic stress disorder)   . Tobacco user    Social History   Social History  . Marital status: Married    Spouse name: N/A  . Number of children: N/A  . Years of education: N/A   Occupational History  . Not on file.   Social History Main Topics  . Smoking status: Current Every Day Smoker    Packs/day: 0.50    Types: Cigarettes  . Smokeless tobacco: Never Used  . Alcohol use 0.0 - 3.6 oz/week     Comment: occasionally; beer  . Drug use: No  . Sexual activity: Yes    Birth control/ protection: Surgical   Other Topics  Concern  . Not on file   Social History Narrative  . No narrative on file    Relevant past medical, surgical, family and social history reviewed and updated as indicated. Interim medical history since our last visit reviewed. Allergies and medications reviewed and updated.  Review of Systems  HENT: Negative.   Respiratory: Negative.   Cardiovascular: Negative.   Gastrointestinal: Negative.   Musculoskeletal: Negative.   Neurological: Negative.   Psychiatric/Behavioral: The patient is nervous/anxious.    Per HPI unless specifically indicated above     Objective:    BP 111/78   Pulse 92   Temp 98.9 F (37.2 C)   Wt 198 lb (89.8 kg)   LMP 11/22/2015 (Approximate)   SpO2 98%   BMI 36.21 kg/m   Wt Readings from Last 3 Encounters:  03/02/17 198 lb (89.8 kg)  12/02/16 202 lb 8 oz (91.9 kg)  10/28/16 206 lb (93.4 kg)    Physical Exam  Constitutional: She is oriented to person, place, and time. She appears well-developed and well-nourished. No distress.  HENT:  Head: Atraumatic.  Eyes: Pupils are equal, round, and reactive to light. Conjunctivae are normal.  Neck: Normal range of motion. Neck supple.  Cardiovascular: Normal rate and normal heart sounds.   Pulmonary/Chest: Effort normal and breath sounds normal. No respiratory distress.  Musculoskeletal: Normal range of motion.  Neurological: She is alert and oriented to person, place, and time.  Skin: Skin is warm and dry.  Psychiatric: She has a normal mood and affect. Her behavior is normal.  Nursing note and vitals reviewed.     Assessment & Plan:   Problem List Items Addressed This Visit      Other   Anxiety - Primary    Will restart the xanax temporarily until we can get another medication stabilized. Will try abilify as latuda was cost-prohibitive. F/u in 1 month for recheck      Relevant Medications   ALPRAZolam (XANAX) 1 MG tablet    Other Visit Diagnoses    Abnormal TSH       Recheck TSH today. F/u  as needed   Relevant Orders   TSH (Completed)       Follow up plan: Return in about 4 weeks (around 03/30/2017) for Anxiety f/u.

## 2017-03-02 NOTE — Telephone Encounter (Signed)
Attempted to call patient to let her know about medication change. No answer and voicemail box was full.

## 2017-03-02 NOTE — Telephone Encounter (Addendum)
Received fax from pharmacy saying that [place] needed prior auth but that she needed to try 2 other meds in that category first. She has tried Seroquel so Apolonio Schneiders changed rx to Abilify and sent to pharm.

## 2017-03-03 LAB — TSH: TSH: 0.998 u[IU]/mL (ref 0.450–4.500)

## 2017-03-04 NOTE — Assessment & Plan Note (Signed)
Will restart the xanax temporarily until we can get another medication stabilized. Will try abilify as latuda was cost-prohibitive. F/u in 1 month for recheck

## 2017-03-04 NOTE — Patient Instructions (Signed)
Follow up in 1 month   

## 2017-03-16 ENCOUNTER — Encounter: Payer: Self-pay | Admitting: Family Medicine

## 2017-03-17 ENCOUNTER — Other Ambulatory Visit: Payer: Self-pay | Admitting: Family Medicine

## 2017-03-17 DIAGNOSIS — F331 Major depressive disorder, recurrent, moderate: Secondary | ICD-10-CM

## 2017-03-31 ENCOUNTER — Ambulatory Visit (INDEPENDENT_AMBULATORY_CARE_PROVIDER_SITE_OTHER): Payer: 59 | Admitting: Family Medicine

## 2017-03-31 ENCOUNTER — Encounter: Payer: Self-pay | Admitting: Family Medicine

## 2017-03-31 VITALS — BP 113/80 | HR 110 | Temp 98.8°F | Wt 201.0 lb

## 2017-03-31 DIAGNOSIS — N760 Acute vaginitis: Secondary | ICD-10-CM

## 2017-03-31 DIAGNOSIS — B9689 Other specified bacterial agents as the cause of diseases classified elsewhere: Secondary | ICD-10-CM | POA: Diagnosis not present

## 2017-03-31 DIAGNOSIS — N898 Other specified noninflammatory disorders of vagina: Secondary | ICD-10-CM | POA: Diagnosis not present

## 2017-03-31 DIAGNOSIS — F419 Anxiety disorder, unspecified: Secondary | ICD-10-CM

## 2017-03-31 LAB — WET PREP FOR TRICH, YEAST, CLUE
CLUE CELL EXAM: POSITIVE — AB
Trichomonas Exam: NEGATIVE
Yeast Exam: NEGATIVE

## 2017-03-31 MED ORDER — HYDROXYZINE HCL 25 MG PO TABS: 25.0000 mg | ORAL_TABLET | Freq: Three times a day (TID) | ORAL | 0 refills | Status: DC | PRN

## 2017-03-31 MED ORDER — ALPRAZOLAM 1 MG PO TABS: 1.0000 mg | ORAL_TABLET | Freq: Two times a day (BID) | ORAL | 0 refills | Status: DC | PRN

## 2017-03-31 MED ORDER — TRAZODONE HCL 50 MG PO TABS: 50.0000 mg | ORAL_TABLET | Freq: Every day | ORAL | 1 refills | Status: DC

## 2017-03-31 MED ORDER — METRONIDAZOLE 500 MG PO TABS: 500.0000 mg | ORAL_TABLET | Freq: Two times a day (BID) | ORAL | 0 refills | Status: DC

## 2017-03-31 NOTE — Patient Instructions (Signed)
[facility] at [facility]. [phone]

## 2017-03-31 NOTE — Progress Notes (Signed)
BP 113/80   Pulse (!) 110   Temp 98.8 F (37.1 C)   Wt 201 lb (91.2 kg)   LMP 11/22/2015 (Approximate)   SpO2 100%   BMI 36.76 kg/m    Subjective:    Patient ID: Laura [name], female    DOB: July 17, 1985, 31 y.o.   MRN: [id number]  HPI: Laura [name] is a 31 y.o. female  Chief Complaint  Patient presents with  . Anxiety    States her anxiety is bad and panic attacks nearly every day. She stopped the Abilify and never got the [place] due to prior auth. Trazadone is hellping her sleep at night.  . Vaginal Discharge    x 4 days, milky white, bad odor   Patient presents for anxiety f/u today. Did not tolerate abilify well, had multiple side effects and noted it made her anxiety worse. Started weaning off abilify about a week ago, now all the way off and having constant agitation off of it as well. Xanax helping control flares. Trazodone is the only thing helping her sleep at night, taking daily. Denies SI/HI, but very frustrated that her moods are under such poor control. Has failed many other tx's in the past, including most SSRIs/SNRIs, seroquel, and now abilify. Anette Guarneri was too expensive. Psychiatry referral pending. Still seeing counselor regularly  White, thin discharge and vaginal odor x 4 days. Denies itching, burning, dysuria, abdominal pain. Not trying anything OTC for sxs.   GAD 7 : Generalized Anxiety Score 03/31/2017 03/03/2017 10/16/2016 07/22/2016  Nervous, Anxious, on Edge 3 3 3 2   Control/stop worrying 2 2 3 3   Worry too much - different things 3 3 3 3   Trouble relaxing 2 3 3 2   Restless 1 2 1 1   Easily annoyed or irritable 3 3 3 1   Afraid - awful might happen 1 1 1  0  Total GAD 7 Score 15 17 17 12   Anxiety Difficulty Somewhat difficult Very difficult Very difficult -   Past Medical History:  Diagnosis Date  . Anxiety   . Bipolar disorder ([place])    past hx of  . Complication of anesthesia    "woke up during surgery"  . Depression   . Heavy periods   .  Incontinence of urine   . Increased BMI   . Indigestion   . PTSD (post-traumatic stress disorder)   . Tobacco user    Social History   Social History  . Marital status: Married    Spouse name: N/A  . Number of children: N/A  . Years of education: N/A   Occupational History  . Not on file.   Social History Main Topics  . Smoking status: Current Every Day Smoker    Packs/day: 0.50    Types: Cigarettes  . Smokeless tobacco: Never Used  . Alcohol use 0.0 - 3.6 oz/week     Comment: occasionally; beer  . Drug use: No  . Sexual activity: Yes    Birth control/ protection: Surgical   Other Topics Concern  . Not on file   Social History Narrative  . No narrative on file   Relevant past medical, surgical, family and social history reviewed and updated as indicated. Interim medical history since our last visit reviewed. Allergies and medications reviewed and updated.  Review of Systems  Constitutional: Negative.   HENT: Negative.   Respiratory: Negative.   Cardiovascular: Negative.   Gastrointestinal: Negative.   Genitourinary: Positive for vaginal discharge.  Musculoskeletal: Negative.  Neurological: Negative.   Psychiatric/Behavioral: Positive for agitation, dysphoric mood and sleep disturbance. The patient is nervous/anxious.    Per HPI unless specifically indicated above     Objective:    BP 113/80   Pulse (!) 110   Temp 98.8 F (37.1 C)   Wt 201 lb (91.2 kg)   LMP 11/22/2015 (Approximate)   SpO2 100%   BMI 36.76 kg/m   Wt Readings from Last 3 Encounters:  03/31/17 201 lb (91.2 kg)  03/02/17 198 lb (89.8 kg)  12/02/16 202 lb 8 oz (91.9 kg)    Physical Exam  Constitutional: She is oriented to person, place, and time. She appears well-developed and well-nourished. No distress.  HENT:  Head: Atraumatic.  Eyes: Pupils are equal, round, and reactive to light. Conjunctivae are normal.  Neck: Normal range of motion. Neck supple.  Cardiovascular: Normal rate  and normal heart sounds.   Pulmonary/Chest: Effort normal and breath sounds normal. No respiratory distress.  Musculoskeletal: Normal range of motion.  Neurological: She is alert and oriented to person, place, and time.  Skin: Skin is warm and dry.  Psychiatric: Judgment and thought content normal.  Nursing note and vitals reviewed.  Results for orders placed or performed in visit on 03/31/17  WET PREP FOR [place], YEAST, CLUE  Result Value Ref Range   Trichomonas Exam Negative Negative   Yeast Exam Negative Negative   Clue Cell Exam Positive (A) Negative      Assessment & Plan:   Problem List Items Addressed This Visit      Other   Anxiety - Primary    Continue off abilify. Xanax and trazodone refilled as they seem to be helping. Contact info given for Psychiatry as she thinks she missed their call to schedule. Will defer trying any other medications to specialist at this point given many drug failures previously.       Relevant Medications   ALPRAZolam (XANAX) 1 MG tablet   hydrOXYzine (ATARAX/VISTARIL) 25 MG tablet   traZODone (DESYREL) 50 MG tablet    Other Visit Diagnoses    BV (bacterial vaginosis)       Wet prep +, flagyl sent. Start probiotics, vaginal hygiene reviewed. F/u if no improvement   Relevant Medications   metroNIDAZOLE (FLAGYL) 500 MG tablet   Other Relevant Orders   WET PREP FOR [place], YEAST, CLUE (Completed)       Follow up plan: Return for CPE.

## 2017-04-01 ENCOUNTER — Encounter: Payer: Self-pay | Admitting: Family Medicine

## 2017-04-02 MED ORDER — TRAZODONE HCL 50 MG PO TABS: 50.0000 mg | ORAL_TABLET | Freq: Every day | ORAL | 1 refills | Status: DC

## 2017-04-02 NOTE — Assessment & Plan Note (Signed)
Continue off abilify. Xanax and trazodone refilled as they seem to be helping. Contact info given for Psychiatry as she thinks she missed their call to schedule. Will defer trying any other medications to specialist at this point given many drug failures previously.

## 2017-04-16 ENCOUNTER — Encounter: Payer: Self-pay | Admitting: Family Medicine

## 2017-04-20 ENCOUNTER — Telehealth: Payer: Self-pay

## 2017-04-20 NOTE — Telephone Encounter (Signed)
This patient needs to be seen. I only saw her once or twice

## 2017-04-20 NOTE — Telephone Encounter (Signed)
received a fax that stated that latuda 20mg  was approved for maximum of 12 fills from  04-20-17 to 04-19-18  ref # [id number]

## 2017-04-25 ENCOUNTER — Emergency Department
Admission: EM | Admit: 2017-04-25 | Discharge: 2017-04-25 | Disposition: A | Discharge status: Home / Self Care | Payer: 59 | Attending: Emergency Medicine | Admitting: Emergency Medicine

## 2017-04-25 ENCOUNTER — Emergency Department: Payer: 59

## 2017-04-25 DIAGNOSIS — Z880 Allergy status to penicillin: Secondary | ICD-10-CM | POA: Insufficient documentation

## 2017-04-25 DIAGNOSIS — F17210 Nicotine dependence, cigarettes, uncomplicated: Secondary | ICD-10-CM | POA: Diagnosis not present

## 2017-04-25 DIAGNOSIS — Z79899 Other long term (current) drug therapy: Secondary | ICD-10-CM | POA: Insufficient documentation

## 2017-04-25 DIAGNOSIS — R1032 Left lower quadrant pain: Secondary | ICD-10-CM | POA: Diagnosis not present

## 2017-04-25 DIAGNOSIS — R110 Nausea: Secondary | ICD-10-CM | POA: Diagnosis not present

## 2017-04-25 DIAGNOSIS — J069 Acute upper respiratory infection, unspecified: Secondary | ICD-10-CM | POA: Diagnosis not present

## 2017-04-25 DIAGNOSIS — N83202 Unspecified ovarian cyst, left side: Secondary | ICD-10-CM | POA: Insufficient documentation

## 2017-04-25 LAB — CBC
HEMATOCRIT: 39.5 % (ref 35.0–47.0)
Hemoglobin: 13.4 g/dL (ref 12.0–16.0)
MCH: 30.5 pg (ref 26.0–34.0)
MCHC: 34 g/dL (ref 32.0–36.0)
MCV: 89.5 fL (ref 80.0–100.0)
Platelets: 250 10*3/uL (ref 150–440)
RBC: 4.41 MIL/uL (ref 3.80–5.20)
RDW: 14.3 % (ref 11.5–14.5)
WBC: 5.4 10*3/uL (ref 3.6–11.0)

## 2017-04-25 LAB — URINALYSIS, COMPLETE (UACMP) WITH MICROSCOPIC
BACTERIA UA: NONE SEEN
Bilirubin Urine: NEGATIVE
Glucose, UA: NEGATIVE mg/dL
Hgb urine dipstick: NEGATIVE
Ketones, ur: NEGATIVE mg/dL
Leukocytes, UA: NEGATIVE
Nitrite: NEGATIVE
PROTEIN: 30 mg/dL — AB
Specific Gravity, Urine: 1.04 — ABNORMAL HIGH (ref 1.005–1.030)
pH: 6 (ref 5.0–8.0)

## 2017-04-25 LAB — COMPREHENSIVE METABOLIC PANEL
ALBUMIN: 3.8 g/dL (ref 3.5–5.0)
ALT: 18 U/L (ref 14–54)
AST: 17 U/L (ref 15–41)
Alkaline Phosphatase: 63 U/L (ref 38–126)
Anion gap: 6 (ref 5–15)
BILIRUBIN TOTAL: 0.4 mg/dL (ref 0.3–1.2)
BUN: 12 mg/dL (ref 6–20)
CHLORIDE: 106 mmol/L (ref 101–111)
CO2: 25 mmol/L (ref 22–32)
Calcium: 8.8 mg/dL — ABNORMAL LOW (ref 8.9–10.3)
Creatinine, Ser: 0.7 mg/dL (ref 0.44–1.00)
GFR calc Af Amer: >60 mL/min (ref >60)
GFR calc non Af Amer: >60 mL/min (ref >60)
GLUCOSE: 86 mg/dL (ref 65–99)
POTASSIUM: 3.4 mmol/L — AB (ref 3.5–5.1)
Sodium: 137 mmol/L (ref 135–145)
Total Protein: 6.8 g/dL (ref 6.5–8.1)

## 2017-04-25 LAB — LIPASE, BLOOD: Lipase: 26 U/L (ref 11–51)

## 2017-04-25 MED ORDER — OXYCODONE-ACETAMINOPHEN 5-325 MG PO TABS
1.0000 | ORAL_TABLET | Freq: Once | ORAL | Status: AC
  Administered 2017-04-25: 1 via ORAL
  Filled 2017-04-25: qty 1

## 2017-04-25 MED ORDER — ONDANSETRON 4 MG PO TBDP: 4.0000 mg | ORAL_TABLET | Freq: Three times a day (TID) | ORAL | 0 refills | Status: DC | PRN

## 2017-04-25 MED ORDER — MORPHINE SULFATE (PF) 4 MG/ML IV SOLN
4.0000 mg | Freq: Once | INTRAVENOUS | Status: AC
  Administered 2017-04-25: 4 mg via INTRAVENOUS
  Filled 2017-04-25: qty 1

## 2017-04-25 MED ORDER — KETOROLAC TROMETHAMINE 10 MG PO TABS: 10.0000 mg | ORAL_TABLET | Freq: Four times a day (QID) | ORAL | 0 refills | Status: DC | PRN

## 2017-04-25 MED ORDER — ONDANSETRON HCL 4 MG/2ML IJ SOLN
4.0000 mg | Freq: Once | INTRAMUSCULAR | Status: AC
  Administered 2017-04-25: 4 mg via INTRAVENOUS
  Filled 2017-04-25: qty 2

## 2017-04-25 MED ORDER — OXYCODONE-ACETAMINOPHEN 5-325 MG PO TABS
1.0000 | ORAL_TABLET | Freq: Four times a day (QID) | ORAL | 0 refills | Status: DC | PRN
Start: 2017-04-25 — End: 2017-07-15

## 2017-04-25 MED ORDER — SODIUM CHLORIDE 0.9 % IV BOLUS (SEPSIS)
1000.0000 mL | Freq: Once | INTRAVENOUS | Status: AC
  Administered 2017-04-25: 1000 mL via INTRAVENOUS

## 2017-04-25 NOTE — ED Triage Notes (Signed)
Pt c/o left lower quadrant abdominal pain as well as back pain. Denies pain with urination but reports cloudy urine. Vs stable at this time.

## 2017-04-25 NOTE — ED Provider Notes (Signed)
[facility] Emergency Department Provider Note  ____________________________________________  Time seen: Approximately 6:17 PM  I have reviewed the triage vital signs and the nursing notes.   HISTORY  Chief Complaint Abdominal Pain    HPI Laura [name] is a 31 y.o. female complains of left lower quadrant abdominal pain radiating to the back that started last night, severe, constant but waxing and waning with severe pains, sharp. Associated with nausea and dry heaves but no vomiting or diarrhea or constipation. No fevers or chills. No aggravating or alleviating factors. Also has had nonproductive cough sore throat and nasal congestion for the past 3 days. Works in a health care setting. No sick family contacts.     Past Medical History:  Diagnosis Date  . Anxiety   . Bipolar disorder ([place])    past hx of  . Complication of anesthesia    "woke up during surgery"  . Depression   . Heavy periods   . Incontinence of urine   . Increased BMI   . Indigestion   . PTSD (post-traumatic stress disorder)   . Tobacco user      Patient Active Problem List   Diagnosis Date Noted  . Decreased libido 10/28/2016  . Obesity (BMI 30-39.9) 10/28/2016  . Hot flashes 04/22/2016  . Status post laparoscopic assisted vaginal hysterectomy (LAVH) 12/25/2015  . Fibroids 12/16/2015  . History of cesarean section 11/06/2015  . Post traumatic stress disorder 03/13/2015  . Depression 03/13/2015  . Anxiety 03/13/2015     Past Surgical History:  Procedure Laterality Date  . CESAREAN SECTION  2010  . LAPAROSCOPIC VAGINAL HYSTERECTOMY WITH SALPINGECTOMY Bilateral 12/16/2015   Procedure: LAPAROSCOPIC ASSISTED VAGINAL HYSTERECTOMY WITH BILATERAL SALPINGECTOMY;  Surgeon: Brayton Mars, MD;  Location: [facility] ORS;  Service: Gynecology;  Laterality: Bilateral;  . TUBAL LIGATION  2011     Prior to Admission medications   Medication Sig Start Date End Date Taking?  Authorizing Provider  ALPRAZolam Duanne Moron) 1 MG tablet Take 1 tablet (1 mg total) by mouth 2 (two) times daily as needed for anxiety. 03/31/17   Volney American, PA-C  BIOTIN PO Take by mouth daily.     [provider]  cyanocobalamin (,VITAMIN B-12,) 1000 MCG/ML injection Inject 1 mL (1,000 mcg total) into the muscle every 30 (thirty) days. 08/20/16   Shambley, Melody N, CNM  fluticasone (FLONASE) 50 MCG/ACT nasal spray Place 2 sprays into both nostrils daily. 01/19/17   Dutch Quint B, FNP  hydrOXYzine (ATARAX/VISTARIL) 25 MG tablet Take 1 tablet (25 mg total) by mouth 3 (three) times daily as needed. 03/31/17   Volney American, PA-C  ketorolac (TORADOL) 10 MG tablet Take 1 tablet (10 mg total) by mouth every 6 (six) hours as needed for moderate pain. 04/25/17   Carrie Mew, MD  lurasidone (LATUDA) 20 MG TABS tablet Take 1 tablet (20 mg total) by mouth daily. Patient not taking: Reported on 03/31/2017 03/02/17   Volney American, PA-C  metroNIDAZOLE (FLAGYL) 500 MG tablet Take 1 tablet (500 mg total) by mouth 2 (two) times daily. 03/31/17   Volney American, PA-C  Multiple Vitamin (MULTIVITAMIN) capsule Take 1 capsule by mouth daily.    [provider]  ondansetron (ZOFRAN ODT) 4 MG disintegrating tablet Take 1 tablet (4 mg total) by mouth every 8 (eight) hours as needed for nausea or vomiting. 04/25/17   Carrie Mew, MD  oxyCODONE-acetaminophen (ROXICET) 5-325 MG tablet Take 1 tablet by mouth every 6 (six)  hours as needed for severe pain. 04/25/17   Carrie Mew, MD  traZODone (DESYREL) 50 MG tablet Take 1 tablet (50 mg total) by mouth at bedtime. 04/02/17   Volney American, PA-C     Allergies Amoxicillin   Family History  Problem Relation Age of Onset  . Mental illness Mother   . Diabetes Maternal Grandmother   . Heart disease Maternal Grandfather   . Cancer Neg Hx     Social History Social History  Substance Use Topics  .  Smoking status: Current Every Day Smoker    Packs/day: 0.50    Types: Cigarettes  . Smokeless tobacco: Never Used  . Alcohol use 0.0 - 3.6 oz/week     Comment: occasionally; beer    Review of Systems  Constitutional:   No fever or chills.  ENT:   No sore throat. Positive rhinorrhea. Cardiovascular:   No chest pain or syncope. Respiratory:   No dyspnea positive nonproductive cough. Gastrointestinal:   Positive for left lower quadrant abdominal pain without vomiting and diarrhea.  Musculoskeletal:   Negative for focal pain or swelling All other systems reviewed and are negative except as documented above in ROS and HPI.  ____________________________________________   PHYSICAL EXAM:  VITAL SIGNS: ED Triage Vitals  Enc Vitals Group     BP 04/25/17 1245 112/78     Pulse Rate 04/25/17 1245 81     Resp 04/25/17 1245 16     Temp 04/25/17 1245 97.8 F (36.6 C)     Temp Source 04/25/17 1245 Oral     SpO2 04/25/17 1245 98 %     Weight 04/25/17 1245 199 lb (90.3 kg)     Height 04/25/17 1245 5\' 2"  (1.575 m)     Head Circumference --      Peak Flow --      Pain Score 04/25/17 1600 4     Pain Loc --      Pain Edu? --      Excl. in [place]? --     Vital signs reviewed, nursing assessments reviewed.   Constitutional:   Alert and oriented. Well appearing and in no distress. Eyes:   No scleral icterus.  EOMI. No nystagmus. No conjunctival pallor. PERRL. ENT   Head:   Normocephalic and atraumatic. TMs normal bilaterally   Nose:   Mild congestion/rhinnorhea.    Mouth/Throat:   MMM, no pharyngeal erythema. No peritonsillar mass.    Neck:   No meningismus. Full ROM. Hematological/Lymphatic/Immunilogical:   No cervical lymphadenopathy. Cardiovascular:   RRR. Symmetric bilateral radial and DP pulses.  No murmurs.  Respiratory:   Normal respiratory effort without tachypnea/retractions. Breath sounds are clear and equal bilaterally. No wheezes/rales/rhonchi. No provoked cough or  wheezing with FEV1 maneuver Gastrointestinal:   Soft with left lower quadrant tenderness. Non distended. There is no CVA tenderness.  No rebound, rigidity, or guarding. Genitourinary:   deferred Musculoskeletal:   Normal range of motion in all extremities. No joint effusions.  No lower extremity tenderness.  No edema. Neurologic:   Normal speech and language.  Motor grossly intact. No gross focal neurologic deficits are appreciated.  Skin:    Skin is warm, dry and intact. No rash noted.  No petechiae, purpura, or bullae.  ____________________________________________    LABS (pertinent positives/negatives) (all labs ordered are listed, but only abnormal results are displayed) Labs Reviewed  COMPREHENSIVE METABOLIC PANEL - Abnormal; Notable for the following:       Result Value   Potassium 3.4 (*)  Calcium 8.8 (*)    All other components within normal limits  URINALYSIS, COMPLETE (UACMP) WITH MICROSCOPIC - Abnormal; Notable for the following:    Color, Urine AMBER (*)    APPearance HAZY (*)    Specific Gravity, Urine 1.040 (*)    Protein, ur 30 (*)    Squamous Epithelial / LPF 0-5 (*)    All other components within normal limits  URINE CULTURE  LIPASE, BLOOD  CBC   ____________________________________________   EKG    ____________________________________________    RADIOLOGY  US Transvaginal Non-ob  Result Date: 04/25/2017 CLINICAL DATA:  Left lower quadrant pain for 3 hours. Status post hysterectomy. EXAM: TRANSABDOMINAL AND TRANSVAGINAL ULTRASOUND OF PELVIS DOPPLER ULTRASOUND OF OVARIES TECHNIQUE: Both transabdominal and transvaginal ultrasound examinations of the pelvis were performed. Transabdominal technique was performed for global imaging of the pelvis including uterus, ovaries, adnexal regions, and pelvic cul-de-sac. It was necessary to proceed with endovaginal exam following the transabdominal exam to visualize the ovaries. Color and duplex Doppler ultrasound  was utilized to evaluate blood flow to the ovaries. COMPARISON:  None. FINDINGS: Uterus Surgically absent. Right ovary Measurements: 1.6 x 1.0 x 1.6 cm. Normal appearance/no adnexal mass. Left ovary Measurements: 3 x 2.4 x 2.6 cm. Contains a dominant follicle measuring up to 2.6 cm with a single thin septation. Pulsed Doppler evaluation of both ovaries demonstrates normal low-resistance arterial and venous waveforms. The blood flow in the left ovary is within the soft tissues surrounding the dominant follicle as expected. Other findings No abnormal free fluid. IMPRESSION: 1. There is a dominant 2.6 cm follicle with a single thin septation in the left ovary. The patient is status post hysterectomy. No other abnormalities. Blood flow seen in both ovaries. Electronically Signed   By: Dorise Bullion III M.D   On: 04/25/2017 16:43   US Pelvis Complete  Result Date: 04/25/2017 CLINICAL DATA:  Left lower quadrant pain for 3 hours. Status post hysterectomy. EXAM: TRANSABDOMINAL AND TRANSVAGINAL ULTRASOUND OF PELVIS DOPPLER ULTRASOUND OF OVARIES TECHNIQUE: Both transabdominal and transvaginal ultrasound examinations of the pelvis were performed. Transabdominal technique was performed for global imaging of the pelvis including uterus, ovaries, adnexal regions, and pelvic cul-de-sac. It was necessary to proceed with endovaginal exam following the transabdominal exam to visualize the ovaries. Color and duplex Doppler ultrasound was utilized to evaluate blood flow to the ovaries. COMPARISON:  None. FINDINGS: Uterus Surgically absent. Right ovary Measurements: 1.6 x 1.0 x 1.6 cm. Normal appearance/no adnexal mass. Left ovary Measurements: 3 x 2.4 x 2.6 cm. Contains a dominant follicle measuring up to 2.6 cm with a single thin septation. Pulsed Doppler evaluation of both ovaries demonstrates normal low-resistance arterial and venous waveforms. The blood flow in the left ovary is within the soft tissues surrounding the  dominant follicle as expected. Other findings No abnormal free fluid. IMPRESSION: 1. There is a dominant 2.6 cm follicle with a single thin septation in the left ovary. The patient is status post hysterectomy. No other abnormalities. Blood flow seen in both ovaries. Electronically Signed   By: Dorise Bullion III M.D   On: 04/25/2017 16:43   [place] Art/ven Flow Abd Pelv Doppler  Result Date: 04/25/2017 CLINICAL DATA:  Left lower quadrant pain for 3 hours. Status post hysterectomy. EXAM: TRANSABDOMINAL AND TRANSVAGINAL ULTRASOUND OF PELVIS DOPPLER ULTRASOUND OF OVARIES TECHNIQUE: Both transabdominal and transvaginal ultrasound examinations of the pelvis were performed. Transabdominal technique was performed for global imaging of the pelvis including uterus, ovaries, adnexal regions, and pelvic  cul-de-sac. It was necessary to proceed with endovaginal exam following the transabdominal exam to visualize the ovaries. Color and duplex Doppler ultrasound was utilized to evaluate blood flow to the ovaries. COMPARISON:  None. FINDINGS: Uterus Surgically absent. Right ovary Measurements: 1.6 x 1.0 x 1.6 cm. Normal appearance/no adnexal mass. Left ovary Measurements: 3 x 2.4 x 2.6 cm. Contains a dominant follicle measuring up to 2.6 cm with a single thin septation. Pulsed Doppler evaluation of both ovaries demonstrates normal low-resistance arterial and venous waveforms. The blood flow in the left ovary is within the soft tissues surrounding the dominant follicle as expected. Other findings No abnormal free fluid. IMPRESSION: 1. There is a dominant 2.6 cm follicle with a single thin septation in the left ovary. The patient is status post hysterectomy. No other abnormalities. Blood flow seen in both ovaries. Electronically Signed   By: Dorise Bullion III M.D   On: 04/25/2017 16:43    ____________________________________________   PROCEDURES Procedures  ____________________________________________   DIFFERENTIAL  DIAGNOSIS  Ovarian cyst, ovarian torsion, cystitis, diverticulitis, viral syndrome  CLINICAL IMPRESSION / ASSESSMENT AND PLAN / ED COURSE  Pertinent labs & imaging results that were available during my care of the patient were reviewed by me and considered in my medical decision making (see chart for details).   Patient well-appearing no acute distress, presents with normal vital signs, constellation of symptoms attributable to a viral illness. The left lower quadrant abdominal pain seems focal and not due to viral gastroenteritis especially in the absence of vomiting and diarrhea. Ultrasound was therefore performed to evaluate for ovarian cyst and possibly ovarian torsion. She has good blood flow and a small cyst. This is reassuring that she is not expressing torsion, but does seem to have a small simple cyst that is either leaking or simply symptomatic. Counseled on gynecology follow-up if her pain does not resolve in the next few days. Return precautions if she should have any sudden worsening of the pain due to the risk of the cyst enlarging overtime and putting her at risk of torsion. Patient is status post hysterectomy and denies any irregular vaginal bleeding or discharge, no suspicion of STI.  Looked at the patient in the controlled substance reporting system, no record found     ____________________________________________   FINAL CLINICAL IMPRESSION(S) / ED DIAGNOSES    Final diagnoses:  Upper respiratory tract infection, unspecified type  Cyst of left ovary      New Prescriptions   KETOROLAC (TORADOL) 10 MG TABLET    Take 1 tablet (10 mg total) by mouth every 6 (six) hours as needed for moderate pain.   ONDANSETRON (ZOFRAN ODT) 4 MG DISINTEGRATING TABLET    Take 1 tablet (4 mg total) by mouth every 8 (eight) hours as needed for nausea or vomiting.   OXYCODONE-ACETAMINOPHEN (ROXICET) 5-325 MG TABLET    Take 1 tablet by mouth every 6 (six) hours as needed for severe pain.      Portions of this note were generated with dragon dictation software. Dictation errors may occur despite best attempts at proofreading.    Carrie Mew, MD 04/25/17 [phone]

## 2017-04-25 NOTE — Discharge Instructions (Signed)
Your ultrasound shows a 2cm ovarian cyst on your left ovary.  Follow up with gynecology if your pain has not resolved within 1 week.  See your primary care doctor for continued monitoring of your symptoms.  Return to the ER if your conditions worsens.

## 2017-04-25 NOTE — ED Notes (Signed)
Patient transported to Ultrasound 

## 2017-04-25 NOTE — ED Notes (Signed)
[facility] patients ride

## 2017-04-26 ENCOUNTER — Encounter: Payer: Self-pay | Admitting: Family Medicine

## 2017-04-26 ENCOUNTER — Ambulatory Visit (INDEPENDENT_AMBULATORY_CARE_PROVIDER_SITE_OTHER): Payer: 59 | Admitting: Family Medicine

## 2017-04-26 VITALS — BP 106/74 | HR 88 | Temp 97.8°F | Ht 62.0 in | Wt 216.2 lb

## 2017-04-26 DIAGNOSIS — J069 Acute upper respiratory infection, unspecified: Secondary | ICD-10-CM

## 2017-04-26 MED ORDER — AZITHROMYCIN 250 MG PO TABS: ORAL_TABLET | ORAL | 0 refills | Status: DC

## 2017-04-26 MED ORDER — HYDROCOD POLST-CPM POLST ER 10-8 MG/5ML PO SUER: 5.0000 mL | Freq: Two times a day (BID) | ORAL | 0 refills | Status: DC | PRN

## 2017-04-26 NOTE — Progress Notes (Signed)
   BP 106/74 (BP Location: Right Arm, Patient Position: Sitting, Cuff Size: Normal)   Pulse 88   Temp 97.8 F (36.6 C)   Ht 5\' 2"  (1.575 m)   Wt 216 lb 3.2 oz (98.1 kg)   LMP 11/22/2015 (Approximate)   SpO2 96%   BMI 39.54 kg/m    Subjective:    Patient ID: Laura [name], female    DOB: 1985-11-10, 32 y.o.   MRN: [id number]  HPI: MAYRE BURY is a 31 y.o. female  Chief Complaint  Patient presents with  . Cough    Thick brown mucous. x's 8 days. Not getting any better.   . Sinusitis   Patient presents with 8 day hx of productive cough with thick brown mucus, facial pressure worse when bending over, ear pressure, and HA. Trying mucinex DM, sudafed, flonase, delsym, and cough syrups, and tessalon perles with some temporary relief. Denies fevers, SOB, CP. Works at a [employer] office so lots of sick contacts.   Relevant past medical, surgical, family and social history reviewed and updated as indicated. Interim medical history since our last visit reviewed. Allergies and medications reviewed and updated.  Review of Systems  Constitutional: Negative.   HENT: Positive for congestion, ear pain and sinus pressure.   Eyes: Negative.   Respiratory: Positive for cough.   Cardiovascular: Negative.   Gastrointestinal: Negative.   Genitourinary: Negative.   Musculoskeletal: Negative.   Neurological: Negative.   Psychiatric/Behavioral: Negative.    Per HPI unless specifically indicated above     Objective:    BP 106/74 (BP Location: Right Arm, Patient Position: Sitting, Cuff Size: Normal)   Pulse 88   Temp 97.8 F (36.6 C)   Ht 5\' 2"  (1.575 m)   Wt 216 lb 3.2 oz (98.1 kg)   LMP 11/22/2015 (Approximate)   SpO2 96%   BMI 39.54 kg/m   Wt Readings from Last 3 Encounters:  04/26/17 216 lb 3.2 oz (98.1 kg)  04/25/17 199 lb (90.3 kg)  03/31/17 201 lb (91.2 kg)    Physical Exam  Constitutional: She is oriented to person, place, and time. She appears well-developed and  well-nourished.  HENT:  Head: Atraumatic.  Right Ear: External ear normal.  Left Ear: External ear normal.  Drainage present b/l nares Oropharynx erythematous B/l sinus pressure  Eyes: Pupils are equal, round, and reactive to light. Conjunctivae are normal. No scleral icterus.  Neck: Normal range of motion. Neck supple.  Cardiovascular: Normal rate and normal heart sounds.   Pulmonary/Chest: Effort normal and breath sounds normal. No respiratory distress. She has no wheezes.  Musculoskeletal: Normal range of motion.  Neurological: She is alert and oriented to person, place, and time.  Skin: Skin is warm and dry.  Psychiatric: She has a normal mood and affect. Her behavior is normal.  Nursing note and vitals reviewed.     Assessment & Plan:   Problem List Items Addressed This Visit    None    Visit Diagnoses    Upper respiratory tract infection, unspecified type    -  Primary   Will treat with zpack and tussionex. Continue OTC remedies, supportive care. Precautions reviewed, pt will space out xanax and tussionex.    Relevant Medications   azithromycin (ZITHROMAX) 250 MG tablet       Follow up plan: Return for as scheduled.

## 2017-04-27 LAB — URINE CULTURE

## 2017-05-01 ENCOUNTER — Telehealth: Payer: 59 | Admitting: Physician Assistant

## 2017-05-01 DIAGNOSIS — J069 Acute upper respiratory infection, unspecified: Secondary | ICD-10-CM

## 2017-05-01 NOTE — Progress Notes (Signed)
Based on what you shared with me it looks like you have a serious condition that should be evaluated in a face to face office visit.  Giving continued symptoms despite recent treatment, you need in person assessment. NOTE: Even if you have entered your credit card information for this eVisit, you will not be charged.   If you are having a true medical emergency please call 911.  If you need an urgent face to face visit, [facility] has four urgent care centers for your convenience.  If you need care fast and have a high deductible or no insurance consider:   [email or web address]  [phone]  [address], [address] [place], [place] [address] 8 am to 8 pm Monday-Friday 10 am to 4 pm Saturday-Sunday   The following sites will take your  insurance:    . [facility] [place] a Provider at this Location  [address] [place], [place] [address] . 10 am to 8 pm Monday-Friday . 12 pm to 8 pm Saturday-Sunday   . [facility] at [place] a Provider at this Location  [address] [place], [place], [place] [address] . 8 am to 8 pm Monday-Friday . 9 am to 6 pm Saturday . 11 am to 6 pm Sunday   . [facility] at [place] Get Driving Directions  [address] [place],  [address] . 8 am to 8 pm Monday-Friday . 8 am to 4 pm Saturday-Sunday   Your e-visit answers were reviewed by a board certified advanced clinical practitioner to complete your personal care plan.  Thank you for using e-Visits.

## 2017-05-03 ENCOUNTER — Other Ambulatory Visit: Payer: Self-pay | Admitting: Family Medicine

## 2017-05-03 NOTE — Telephone Encounter (Signed)
Routing to provider. No follow up on file. 

## 2017-05-03 NOTE — Telephone Encounter (Signed)
Routing to provider  

## 2017-05-04 ENCOUNTER — Other Ambulatory Visit: Payer: Self-pay | Admitting: Family Medicine

## 2017-05-04 MED ORDER — HYDROXYZINE HCL 25 MG PO TABS: 25.0000 mg | ORAL_TABLET | Freq: Three times a day (TID) | ORAL | 0 refills | Status: DC | PRN

## 2017-05-04 MED ORDER — ALPRAZOLAM 1 MG PO TABS: 1.0000 mg | ORAL_TABLET | Freq: Two times a day (BID) | ORAL | 0 refills | Status: DC | PRN

## 2017-06-03 ENCOUNTER — Telehealth: Payer: 59 | Admitting: Physician Assistant

## 2017-06-03 DIAGNOSIS — H1030 Unspecified acute conjunctivitis, unspecified eye: Secondary | ICD-10-CM

## 2017-06-03 MED ORDER — POLYMYXIN B-TRIMETHOPRIM 10000-0.1 UNIT/ML-% OP SOLN: OPHTHALMIC | 0 refills | Status: DC

## 2017-06-03 NOTE — Progress Notes (Signed)

## 2017-06-08 ENCOUNTER — Other Ambulatory Visit: Payer: Self-pay | Admitting: Family Medicine

## 2017-06-10 NOTE — Telephone Encounter (Signed)
LVM for patient to return phone call.  

## 2017-06-10 NOTE — Telephone Encounter (Signed)
Psych at [facility] contacted patient and she never called back to schedule.

## 2017-06-10 NOTE — Telephone Encounter (Signed)
I got a request for her xanax - She was supposed to be seeing Psychiatry at this point per our discussion a month or two ago - has that not happened?

## 2017-06-10 NOTE — Telephone Encounter (Signed)
I have had multiple conversations about transferring her Psychiatric care to a Psychiatrist now with pt, please call and let her know that she needs to be following up with them to get her medications.

## 2017-06-11 ENCOUNTER — Other Ambulatory Visit: Payer: Self-pay | Admitting: Family Medicine

## 2017-06-11 NOTE — Telephone Encounter (Signed)
Routing to provider  

## 2017-06-11 NOTE — Telephone Encounter (Signed)
Called patient, no answer.  Mailbox is now full and unable to leave message.

## 2017-06-14 ENCOUNTER — Encounter: Payer: Self-pay | Admitting: Family Medicine

## 2017-07-15 ENCOUNTER — Ambulatory Visit (INDEPENDENT_AMBULATORY_CARE_PROVIDER_SITE_OTHER): Payer: 59 | Admitting: Family Medicine

## 2017-07-15 ENCOUNTER — Encounter: Payer: Self-pay | Admitting: Family Medicine

## 2017-07-15 VITALS — BP 109/78 | HR 84 | Temp 97.9°F | Wt 220.6 lb

## 2017-07-15 DIAGNOSIS — Z113 Encounter for screening for infections with a predominantly sexual mode of transmission: Secondary | ICD-10-CM

## 2017-07-15 NOTE — Progress Notes (Signed)
BP 109/78 (BP Location: Left Arm, Patient Position: Sitting, Cuff Size: Large)   Pulse 84   Temp 97.9 F (36.6 C)   Wt 220 lb 9 oz (100 kg)   LMP 11/22/2015 (Approximate)   SpO2 99%   BMI 40.34 kg/m    Subjective:    Patient ID: Laura [name], female    DOB: 03-Nov-1985, 32 y.o.   MRN: [id number]  HPI: Laura [name] is a 32 y.o. female  Chief Complaint  Patient presents with  . SEXUALLY TRANSMITTED DISEASE    Patient would like to be screened for all STD's   Pt presents today for screening for STIs. Not currently having any sxs or known exposures, but has been dealing with some ongoing infidelity in her marriage and wants to be tested to make sure. No other concerns today.   Relevant past medical, surgical, family and social history reviewed and updated as indicated. Interim medical history since our last visit reviewed. Allergies and medications reviewed and updated.  Review of Systems  Constitutional: Negative.  Negative for fever.  Respiratory: Negative.   Cardiovascular: Negative.   Gastrointestinal: Negative.   Genitourinary: Negative for dysuria, genital sores, pelvic pain, vaginal discharge and vaginal pain.  Skin: Negative.  Negative for rash.  Neurological: Negative.   Psychiatric/Behavioral: Negative.    Per HPI unless specifically indicated above     Objective:    BP 109/78 (BP Location: Left Arm, Patient Position: Sitting, Cuff Size: Large)   Pulse 84   Temp 97.9 F (36.6 C)   Wt 220 lb 9 oz (100 kg)   LMP 11/22/2015 (Approximate)   SpO2 99%   BMI 40.34 kg/m   Wt Readings from Last 3 Encounters:  07/15/17 220 lb 9 oz (100 kg)  04/26/17 216 lb 3.2 oz (98.1 kg)  04/25/17 199 lb (90.3 kg)    Physical Exam  Constitutional: She is oriented to person, place, and time. She appears well-nourished. No distress.  HENT:  Head: Atraumatic.  Eyes: Conjunctivae are normal. Pupils are equal, round, and reactive to light.  Neck: Normal range of  motion. Neck supple.  Cardiovascular: Normal rate and normal heart sounds.  Pulmonary/Chest: Effort normal. No respiratory distress.  Musculoskeletal: Normal range of motion.  Neurological: She is alert and oriented to person, place, and time.  Skin: Skin is warm and dry.  Psychiatric: She has a normal mood and affect. Her behavior is normal.  Nursing note and vitals reviewed.  Results for orders placed or performed in visit on 07/15/17  GC/Chlamydia Probe Amp  Result Value Ref Range   Chlamydia trachomatis, NAA Negative Negative   Neisseria gonorrhoeae by PCR Negative Negative  HIV antibody  Result Value Ref Range   HIV Screen 4th Generation wRfx Non Reactive Non Reactive  HSV(herpes simplex vrs) 1+2 ab-IgG  Result Value Ref Range   HSV 1 Glycoprotein G Ab, IgG <0.91 0.00 - 0.90 index   HSV 2 IgG, Type Spec <0.91 0.00 - 0.90 index  RPR  Result Value Ref Range   RPR Ser Ql Non Reactive Non Reactive      Assessment & Plan:   Problem List Items Addressed This Visit    None    Visit Diagnoses    Screening for STD (sexually transmitted disease)    -  Primary   Will check full panel for STIs. Await results.    Relevant Orders   HIV antibody (Completed)   HSV(herpes simplex vrs) 1+2 ab-IgG (Completed)  RPR (Completed)   GC/Chlamydia Probe Amp (Completed)       Follow up plan: Return if symptoms worsen or fail to improve.

## 2017-07-16 LAB — HSV(HERPES SIMPLEX VRS) I + II AB-IGG: HSV 1 Glycoprotein G Ab, IgG: <0.91 index (ref 0.00–0.90)

## 2017-07-16 LAB — RPR: RPR: NONREACTIVE

## 2017-07-16 LAB — HIV ANTIBODY (ROUTINE TESTING W REFLEX): HIV SCREEN 4TH GENERATION: NONREACTIVE

## 2017-07-17 LAB — GC/CHLAMYDIA PROBE AMP
CHLAMYDIA, DNA PROBE: NEGATIVE
Neisseria gonorrhoeae by PCR: NEGATIVE

## 2017-07-18 NOTE — Patient Instructions (Signed)
Follow up as needed

## 2017-08-05 ENCOUNTER — Telehealth: Payer: 59 | Admitting: Physician Assistant

## 2017-08-05 DIAGNOSIS — R200 Anesthesia of skin: Secondary | ICD-10-CM

## 2017-08-05 DIAGNOSIS — M5430 Sciatica, unspecified side: Secondary | ICD-10-CM

## 2017-08-05 DIAGNOSIS — R202 Paresthesia of skin: Secondary | ICD-10-CM

## 2017-08-05 NOTE — Progress Notes (Signed)
Based on what you shared with me it looks like you have a serious condition that should be evaluated in a face to face office visit.  This does sound like a flare up of sciatica with the pain radiating into lower extremity, but giving the mention of numbness and altered sensations, you need assessment in person to make sure no imaging is needed along with treatment. You would also likely benefit from a steroid shot which has to be given in office.   NOTE: If you entered your credit card information for this eVisit, you will not be charged. You may see a "hold" on your card for the $30 but that hold will drop off and you will not have a charge processed.  If you are having a true medical emergency please call 911.  If you need an urgent face to face visit, [place] has four urgent care centers for your convenience.  If you need care fast and have a high deductible or no insurance consider:   [email or web address] to reserve your spot online an avoid wait times  [facility] [address] [place], [place] [address] 8 am to 8 pm Monday-Friday 10 am to 4 pm Saturday-Sunday *Across the street from [employer]  [place], [address] 8 am to 5 pm Monday-Friday * In the [facility] on the [address]   The following sites will take your  insurance:  . [facility] [place] a Provider at this Location  [address] [place], [place] [address] . 10 am to 8 pm Monday-Friday . 12 pm to 8 pm Saturday-Sunday   . [facility] [place] a Provider at this Location  [address] [place], [place], [place] [address] . 8 am to 8 pm Monday-Friday . 9 am to 6 pm Saturday . 11 am to 6 pm Sunday   . [facility] [place] Get Driving Directions  [address] [place], [place] [address] . 8 am to 8 pm Monday-Friday . 8 am to 4 pm Saturday-Sunday   Your e-visit answers were reviewed by a board certified advanced clinical practitioner to complete your personal care plan.  Thank you for using e-Visits.

## 2017-08-09 ENCOUNTER — Encounter: Payer: Self-pay | Admitting: Family Medicine

## 2017-08-09 ENCOUNTER — Ambulatory Visit (INDEPENDENT_AMBULATORY_CARE_PROVIDER_SITE_OTHER): Payer: 59 | Admitting: Family Medicine

## 2017-08-09 VITALS — BP 121/86 | HR 90 | Temp 97.9°F | Wt 221.7 lb

## 2017-08-09 DIAGNOSIS — M5442 Lumbago with sciatica, left side: Secondary | ICD-10-CM

## 2017-08-09 MED ORDER — CYCLOBENZAPRINE HCL 10 MG PO TABS: 10.0000 mg | ORAL_TABLET | Freq: Three times a day (TID) | ORAL | 0 refills | Status: DC | PRN

## 2017-08-09 MED ORDER — TRAMADOL HCL 50 MG PO TABS: 50.0000 mg | ORAL_TABLET | Freq: Three times a day (TID) | ORAL | 0 refills | Status: DC | PRN

## 2017-08-09 MED ORDER — TRIAMCINOLONE ACETONIDE 40 MG/ML IJ SUSP
40.0000 mg | Freq: Once | INTRAMUSCULAR | Status: AC
  Administered 2017-08-09: 40 mg via INTRAMUSCULAR

## 2017-08-09 NOTE — Progress Notes (Signed)
BP 121/86   Pulse 90   Temp 97.9 F (36.6 C) (Oral)   Wt 221 lb 11.2 oz (100.6 kg)   LMP 11/22/2015 (Approximate)   SpO2 100%   BMI 40.55 kg/m    Subjective:    Patient ID: Laura [name], female    DOB: 10-25-1985, 32 y.o.   MRN: [id number]  HPI: Laura [name] is a 32 y.o. female  Chief Complaint  Patient presents with  . Back Pain    lower back for 2 weeks, started as a dull ache but has become more constant. States her left side gets numb and runs into her buttock and down her thigh. Denies any urinary symptoms    Left low back pain x 2 weeks, worsening over time. Now constant and sharp sensation, with numbness and radiation going down left upper leg when laying on back. Hx of sciatica, states this feels consistent. Did an evisit several days ago and was told she would need to come in and be seen for treatment of sciatica. Denies fever, chills, N/V, incontinence, saddle paresthesias. Taking some old meloxicam and tried some ibuprofen with no relief. Urinalysis was negative when she checked a few days ago.   Relevant past medical, surgical, family and social history reviewed and updated as indicated. Interim medical history since our last visit reviewed. Allergies and medications reviewed and updated.  Review of Systems  Constitutional: Negative.   HENT: Negative.   Respiratory: Negative.   Cardiovascular: Negative.   Gastrointestinal: Negative.   Genitourinary: Negative.   Musculoskeletal: Positive for back pain, gait problem and myalgias.  Neurological: Positive for numbness.  Psychiatric/Behavioral: Negative.    Per HPI unless specifically indicated above     Objective:    BP 121/86   Pulse 90   Temp 97.9 F (36.6 C) (Oral)   Wt 221 lb 11.2 oz (100.6 kg)   LMP 11/22/2015 (Approximate)   SpO2 100%   BMI 40.55 kg/m   Wt Readings from Last 3 Encounters:  08/09/17 221 lb 11.2 oz (100.6 kg)  07/15/17 220 lb 9 oz (100 kg)  04/26/17 216 lb 3.2 oz (98.1  kg)    Physical Exam  Constitutional: She is oriented to person, place, and time. She appears well-developed and well-nourished. No distress.  HENT:  Head: Atraumatic.  Eyes: Conjunctivae are normal. Pupils are equal, round, and reactive to light.  Neck: Normal range of motion. Neck supple.  Cardiovascular: Normal rate and normal heart sounds.  Pulmonary/Chest: Effort normal and breath sounds normal. No respiratory distress.  Musculoskeletal:  Antalgic gait, neg SLR, ttp from left lateral low back down into hip  Neurological: She is alert and oriented to person, place, and time.  Skin: Skin is warm and dry.  Psychiatric: She has a normal mood and affect. Her behavior is normal.  Nursing note and vitals reviewed.     Assessment & Plan:   Problem List Items Addressed This Visit    None    Visit Diagnoses    Acute left-sided low back pain with left-sided sciatica    -  Primary   Will tx with IM kenalog, flexeril, massage, epsom salt soaks, and piriformis stretches. Small supply of tramadol given for night time use.   Relevant Medications   cyclobenzaprine (FLEXERIL) 10 MG tablet   traMADol (ULTRAM) 50 MG tablet   triamcinolone acetonide (KENALOG-40) injection 40 mg (Completed)    Sedation precautions reviewed with tramadol and flexeril, pt aware to not drive, drink alcohol,  etc while taking medicines.    Follow up plan: Return for as scheduled.

## 2017-08-09 NOTE — Patient Instructions (Addendum)
Piriformis Syndrome Piriformis syndrome is a condition that can cause pain and numbness in your buttocks and down the back of your leg. Piriformis syndrome happens when the small muscle that connects the base of your spine to your hip (piriformis muscle) presses on the nerve that runs down the back of your leg (sciatic nerve). The piriformis muscle helps your hip rotate and helps to bring your leg back and out. It also helps shift your weight while you are walking to keep you stable. The sciatic nerve runs under or through the piriformis. Damage to the piriformis muscle can cause spasms that put pressure on the nerve below. This causes pain and discomfort while sitting and moving. The pain may feel as if it begins in the buttock and spreads (radiates) down your hip and thigh. What are the causes? This condition is caused by pressure on the sciatic nerve from the piriformis muscle. The piriformis muscle can get irritated with overuse, especially if other hip muscles are weak and the piriformis has to do extra work. Piriformis syndrome can also occur after an injury, like a fall onto your buttocks. What increases the risk? This condition is more likely to develop in:  Women.  People who sit for long periods of time.  Cyclists.  People who have weak buttocks muscles (gluteal muscles).  What are the signs or symptoms? Pain, tingling, or numbness that starts in the buttock and runs down the back of your leg (sciatica) is the most common symptom of this condition. Your symptoms may:  Get worse the longer you sit.  Get worse when you walk, run, or go up on stairs.  How is this diagnosed? This condition is diagnosed based on your symptoms, medical history, and physical exam. During this exam, your health care provider may move your leg into different positions to check for pain. He or she will also press on the muscles of your hip and buttock to see if that increases your symptoms. You may also have  an X-ray or MRI. How is this treated? Treatment for this condition may include:  Stopping all activities that cause pain or make your condition worse.  Using heat or ice to relieve pain as told by your health care provider.  Taking medicines to reduce pain and swelling.  Taking a muscle relaxer to release the piriformis muscle.  Doing range-of-motion and strengthening exercises (physical therapy) as told by your health care provider.  Massaging the affected area.  Getting an injection of an anti-inflammatory medicine or muscle relaxer to reduce inflammation and muscle tension.  In rare cases, you may need surgery to cut the muscle and release pressure on the nerve if other treatments do not work. Follow these instructions at home:  Take over-the-counter and prescription medicines only as told by your health care provider.  Do not sit for long periods. Get up and walk around every 20 minutes or as often as told by your health care provider.  If directed, apply heat to the affected area as often as told by your health care provider. Use the heat source that your health care provider recommends, such as a moist heat pack or a heating pad. ? Place a towel between your skin and the heat source. ? Leave the heat on for 20-30 minutes. ? Remove the heat if your skin turns bright red. This is especially important if you are unable to feel pain, heat, or cold. You may have a greater risk of getting burned.  If   directed, apply ice to the injured area. ? Put ice in a plastic bag. ? Place a towel between your skin and the bag. ? Leave the ice on for 20 minutes, 2-3 times a day.  Do exercises as told by your health care provider.  Return to your normal activities as told by your health care provider. Ask your health care provider what activities are safe for you.  Keep all follow-up visits as told by your health care provider. This is important. How is this prevented?  Do not sit for  longer than 20 minutes at a time. When you sit, choose padded surfaces.  Warm up and stretch before being active.  Cool down and stretch after being active.  Give your body time to rest between periods of activity.  Make sure to use equipment that fits you.  Maintain physical fitness, including: ? Strength. ? Flexibility. Contact a health care provider if:  Your pain and stiffness continue or get worse.  Your leg or hip becomes weak.  You have changes in your bowel function or bladder function. This information is not intended to replace advice given to you by your health care provider. Make sure you discuss any questions you have with your health care provider. Document Released: 06/15/2005 Document Revised: 02/18/2016 Document Reviewed: 05/28/2015 Elsevier Interactive Patient Education  2018 [employer]  

## 2017-08-31 ENCOUNTER — Other Ambulatory Visit: Payer: Self-pay | Admitting: Family Medicine

## 2017-09-01 ENCOUNTER — Encounter: Payer: Self-pay | Admitting: Family Medicine

## 2017-09-01 MED ORDER — CYCLOBENZAPRINE HCL 10 MG PO TABS: 10.0000 mg | ORAL_TABLET | Freq: Three times a day (TID) | ORAL | 0 refills | Status: DC | PRN

## 2017-09-01 NOTE — Telephone Encounter (Signed)
Refilled flexeril, refused tramadol refill as this would require an OV given controlled pain medication

## 2017-09-02 ENCOUNTER — Other Ambulatory Visit: Payer: Self-pay | Admitting: Family Medicine

## 2017-09-02 DIAGNOSIS — G8929 Other chronic pain: Secondary | ICD-10-CM

## 2017-09-02 DIAGNOSIS — M5440 Lumbago with sciatica, unspecified side: Principal | ICD-10-CM

## 2017-09-20 ENCOUNTER — Ambulatory Visit: Payer: 59

## 2017-10-19 ENCOUNTER — Encounter: Payer: Self-pay | Admitting: Family Medicine

## 2017-10-20 ENCOUNTER — Other Ambulatory Visit: Payer: Self-pay | Admitting: Family Medicine

## 2017-10-20 MED ORDER — FLUCONAZOLE 150 MG PO TABS: 150.0000 mg | ORAL_TABLET | Freq: Once | ORAL | 0 refills | Status: AC

## 2017-10-26 DIAGNOSIS — D485 Neoplasm of uncertain behavior of skin: Secondary | ICD-10-CM | POA: Diagnosis not present

## 2017-11-04 ENCOUNTER — Encounter: Payer: Self-pay | Admitting: Obstetrics and Gynecology

## 2017-11-08 ENCOUNTER — Encounter: Payer: Self-pay | Admitting: Family Medicine

## 2017-11-09 ENCOUNTER — Ambulatory Visit: Payer: 59 | Admitting: Psychiatry

## 2017-11-09 ENCOUNTER — Other Ambulatory Visit: Payer: Self-pay

## 2017-11-09 ENCOUNTER — Encounter: Payer: Self-pay | Admitting: Psychiatry

## 2017-11-09 VITALS — BP 114/83 | HR 102 | Temp 98.0°F | Wt 212.8 lb

## 2017-11-09 DIAGNOSIS — F3181 Bipolar II disorder: Secondary | ICD-10-CM

## 2017-11-09 DIAGNOSIS — F4310 Post-traumatic stress disorder, unspecified: Secondary | ICD-10-CM

## 2017-11-09 DIAGNOSIS — F17200 Nicotine dependence, unspecified, uncomplicated: Secondary | ICD-10-CM

## 2017-11-09 DIAGNOSIS — F5105 Insomnia due to other mental disorder: Secondary | ICD-10-CM

## 2017-11-09 MED ORDER — PROPRANOLOL HCL 10 MG PO TABS: 10.0000 mg | ORAL_TABLET | Freq: Three times a day (TID) | ORAL | 1 refills | Status: DC | PRN

## 2017-11-09 MED ORDER — OLANZAPINE 5 MG PO TABS: 5.0000 mg | ORAL_TABLET | Freq: Every day | ORAL | 1 refills | Status: DC

## 2017-11-09 MED ORDER — SERTRALINE HCL 25 MG PO TABS: 25.0000 mg | ORAL_TABLET | Freq: Every day | ORAL | 1 refills | Status: DC

## 2017-11-09 NOTE — Patient Instructions (Signed)
Sertraline tablets What is this medicine? SERTRALINE (SER tra leen) is used to treat depression. It may also be used to treat obsessive compulsive disorder, panic disorder, post-trauma stress, premenstrual dysphoric disorder (PMDD) or social anxiety. This medicine may be used for other purposes; ask your health care provider or pharmacist if you have questions. COMMON BRAND NAME(S): Zoloft What should I tell my health care provider before I take this medicine? They need to know if you have any of these conditions: -bleeding disorders -bipolar disorder or a family history of bipolar disorder -glaucoma -heart disease -high blood pressure -history of irregular heartbeat -history of low levels of calcium, magnesium, or potassium in the blood -if you often drink alcohol -liver disease -receiving electroconvulsive therapy -seizures -suicidal thoughts, plans, or attempt; a previous suicide attempt by you or a family member -take medicines that treat or prevent blood clots -thyroid disease -an unusual or allergic reaction to sertraline, other medicines, foods, dyes, or preservatives -pregnant or trying to get pregnant -breast-feeding How should I use this medicine? Take this medicine by mouth with a glass of water. Follow the directions on the prescription label. You can take it with or without food. Take your medicine at regular intervals. Do not take your medicine more often than directed. Do not stop taking this medicine suddenly except upon the advice of your doctor. Stopping this medicine too quickly may cause serious side effects or your condition may worsen. A special MedGuide will be given to you by the pharmacist with each prescription and refill. Be sure to read this information carefully each time. Talk to your pediatrician regarding the use of this medicine in children. While this drug may be prescribed for children as young as 7 years for selected conditions, precautions do  apply. Overdosage: If you think you have taken too much of this medicine contact a poison control center or emergency room at once. NOTE: This medicine is only for you. Do not share this medicine with others. What if I miss a dose? If you miss a dose, take it as soon as you can. If it is almost time for your next dose, take only that dose. Do not take double or extra doses. What may interact with this medicine? Do not take this medicine with any of the following medications: -cisapride -dofetilide -dronedarone -linezolid -MAOIs like Carbex, Eldepryl, Marplan, Nardil, and Parnate -methylene blue (injected into a vein) -pimozide -thioridazine This medicine may also interact with the following medications: -alcohol -amphetamines -aspirin and aspirin-like medicines -certain medicines for depression, anxiety, or psychotic disturbances -certain medicines for fungal infections like ketoconazole, fluconazole, posaconazole, and itraconazole -certain medicines for irregular heart beat like flecainide, quinidine, propafenone -certain medicines for migraine headaches like almotriptan, eletriptan, frovatriptan, naratriptan, rizatriptan, sumatriptan, zolmitriptan -certain medicines for sleep -certain medicines for seizures like carbamazepine, valproic acid, phenytoin -certain medicines that treat or prevent blood clots like warfarin, enoxaparin, dalteparin -cimetidine -digoxin -diuretics -fentanyl -isoniazid -lithium -NSAIDs, medicines for pain and inflammation, like ibuprofen or naproxen -other medicines that prolong the QT interval (cause an abnormal heart rhythm) -rasagiline -safinamide -supplements like St. John's wort, kava kava, valerian -tolbutamide -tramadol -tryptophan This list may not describe all possible interactions. Give your health care provider a list of all the medicines, herbs, non-prescription drugs, or dietary supplements you use. Also tell them if you smoke, drink  alcohol, or use illegal drugs. Some items may interact with your medicine. What should I watch for while using this medicine? Tell your doctor if your symptoms   do not get better or if they get worse. Visit your doctor or health care professional for regular checks on your progress. Because it may take several weeks to see the full effects of this medicine, it is important to continue your treatment as prescribed by your doctor. Patients and their families should watch out for new or worsening thoughts of suicide or depression. Also watch out for sudden changes in feelings such as feeling anxious, agitated, panicky, irritable, hostile, aggressive, impulsive, severely restless, overly excited and hyperactive, or not being able to sleep. If this happens, especially at the beginning of treatment or after a change in dose, call your health care professional. Dennis Bast may get drowsy or dizzy. Do not drive, use machinery, or do anything that needs mental alertness until you know how this medicine affects you. Do not stand or sit up quickly, especially if you are an older patient. This reduces the risk of dizzy or fainting spells. Alcohol may interfere with the effect of this medicine. Avoid alcoholic drinks. Your mouth may get dry. Chewing sugarless gum or sucking hard candy, and drinking plenty of water may help. Contact your doctor if the problem does not go away or is severe. What side effects may I notice from receiving this medicine? Side effects that you should report to your doctor or health care professional as soon as possible: -allergic reactions like skin rash, itching or hives, swelling of the face, lips, or tongue -anxious -black, tarry stools -changes in vision -confusion -elevated mood, decreased need for sleep, racing thoughts, impulsive behavior -eye pain -fast, irregular heartbeat -feeling faint or lightheaded, falls -feeling agitated, angry, or irritable -hallucination, loss of contact with  reality -loss of balance or coordination -loss of memory -painful or prolonged erections -restlessness, pacing, inability to keep still -seizures -stiff muscles -suicidal thoughts or other mood changes -trouble sleeping -unusual bleeding or bruising -unusually weak or tired -vomiting Side effects that usually do not require medical attention (report to your doctor or health care professional if they continue or are bothersome): -change in appetite or weight -change in sex drive or performance -diarrhea -increased sweating -indigestion, nausea -tremors This list may not describe all possible side effects. Call your doctor for medical advice about side effects. You may report side effects to FDA at 1-800-FDA-1088. Where should I keep my medicine? Keep out of the reach of children. Store at room temperature between 15 and 30 degrees C (59 and 86 degrees F). Throw away any unused medicine after the expiration date. NOTE: This sheet is a summary. It may not cover all possible information. If you have questions about this medicine, talk to your doctor, pharmacist, or health care provider.  2018 [employer] Standard (2016-06-19 14:17:49) Propranolol tablets What is this medicine? PROPRANOLOL (proe PRAN oh lole) is a beta-blocker. Beta-blockers reduce the workload on the heart and help it to beat more regularly. This medicine is used to treat high blood pressure, to control irregular heart rhythms (arrhythmias) and to relieve chest pain caused by angina. It may also be helpful after a heart attack. This medicine is also used to prevent migraine headaches, relieve uncontrollable shaking (tremors), and help certain problems related to the thyroid gland and adrenal gland. This medicine may be used for other purposes; ask your health care provider or pharmacist if you have questions. COMMON BRAND NAME(S): Inderal What should I tell my health care provider before I take this medicine? They need  to know if you have any of these conditions: -circulation  problems or blood vessel disease -diabetes -history of heart attack or heart disease, vasospastic angina -kidney disease -liver disease -lung or breathing disease, like asthma or emphysema -pheochromocytoma -slow heart rate -thyroid disease -an unusual or allergic reaction to propranolol, other beta-blockers, medicines, foods, dyes, or preservatives -pregnant or trying to get pregnant -breast-feeding How should I use this medicine? Take this medicine by mouth with a glass of water. Follow the directions on the prescription label. Take your doses at regular intervals. Do not take your medicine more often than directed. Do not stop taking except on your the advice of your doctor or health care professional. Talk to your pediatrician regarding the use of this medicine in children. Special care may be needed. Overdosage: If you think you have taken too much of this medicine contact a poison control center or emergency room at once. NOTE: This medicine is only for you. Do not share this medicine with others. What if I miss a dose? If you miss a dose, take it as soon as you can. If it is almost time for your next dose, take only that dose. Do not take double or extra doses. What may interact with this medicine? Do not take this medicine with any of the following medications: -feverfew -phenothiazines like chlorpromazine, mesoridazine, prochlorperazine, thioridazine This medicine may also interact with the following medications: -aluminum hydroxide gel -antipyrine -antiviral medicines for HIV or AIDS -barbiturates like phenobarbital -certain medicines for blood pressure, heart disease, irregular heart beat -cimetidine -ciprofloxacin -diazepam -fluconazole -haloperidol -isoniazid -medicines for cholesterol like cholestyramine or colestipol -medicines for mental depression -medicines for migraine headache like almotriptan,  eletriptan, frovatriptan, naratriptan, rizatriptan, sumatriptan, zolmitriptan -NSAIDs, medicines for pain and inflammation, like ibuprofen or naproxen -phenytoin -rifampin -teniposide -theophylline -thyroid medicines -tolbutamide -warfarin -zileuton This list may not describe all possible interactions. Give your health care provider a list of all the medicines, herbs, non-prescription drugs, or dietary supplements you use. Also tell them if you smoke, drink alcohol, or use illegal drugs. Some items may interact with your medicine. What should I watch for while using this medicine? Visit your doctor or health care professional for regular check ups. Check your blood pressure and pulse rate regularly. Ask your health care professional what your blood pressure and pulse rate should be, and when you should contact them. You may get drowsy or dizzy. Do not drive, use machinery, or do anything that needs mental alertness until you know how this drug affects you. Do not stand or sit up quickly, especially if you are an older patient. This reduces the risk of dizzy or fainting spells. Alcohol can make you more drowsy and dizzy. Avoid alcoholic drinks. This medicine can affect blood sugar levels. If you have diabetes, check with your doctor or health care professional before you change your diet or the dose of your diabetic medicine. Do not treat yourself for coughs, colds, or pain while you are taking this medicine without asking your doctor or health care professional for advice. Some ingredients may increase your blood pressure. What side effects may I notice from receiving this medicine? Side effects that you should report to your doctor or health care professional as soon as possible: -allergic reactions like skin rash, itching or hives, swelling of the face, lips, or tongue -breathing problems -changes in blood sugar -cold hands or feet -difficulty sleeping, nightmares -dry peeling  skin -hallucinations -muscle cramps or weakness -slow heart rate -swelling of the legs and ankles -vomiting Side effects that usually do  not require medical attention (report to your doctor or health care professional if they continue or are bothersome): -change in sex drive or performance -diarrhea -dry sore eyes -hair loss -nausea -weak or tired This list may not describe all possible side effects. Call your doctor for medical advice about side effects. You may report side effects to FDA at 1-800-FDA-1088. Where should I keep my medicine? Keep out of the reach of children. Store at room temperature between 15 and 30 degrees C (59 and 86 degrees F). Protect from light. Throw away any unused medicine after the expiration date. NOTE: This sheet is a summary. It may not cover all possible information. If you have questions about this medicine, talk to your doctor, pharmacist, or health care provider.  2018 [employer] Standard (2013-02-17 14:51:53) Olanzapine tablets What is this medicine? OLANZAPINE (oh LAN za peen) is used to treat schizophrenia, psychotic disorders, and bipolar disorder. Bipolar disorder is also known as manic-depression. This medicine may be used for other purposes; ask your health care provider or pharmacist if you have questions. COMMON BRAND NAME(S): Zyprexa What should I tell my health care provider before I take this medicine? They need to know if you have any of these conditions: -breast cancer or history of breast cancer -cigarette smoker -dementia -diabetes mellitus, high blood sugar or a family history of diabetes -difficulty swallowing -glaucoma -heart disease, irregular heartbeat, or previous heart attack -history of brain tumor or head injury -kidney or liver disease -low blood pressure or dizziness when standing up -Parkinson's disease -prostate trouble -seizures (convulsions) -suicidal thoughts, plans, or attempt by you or a family  member -an unusual or allergic reaction to olanzapine, other medicines, foods, dyes, or preservatives -pregnant or trying to get pregnant -breast-feeding How should I use this medicine? Take this medicine by mouth. Swallow it with a drink of water. Follow the directions on the prescription label. Take your medicine at regular intervals. Do not take it more often than directed. Do not stop taking except on the advice of your doctor or health care professional. A special MedGuide will be given to you by the pharmacist with each new prescription and refill. Be sure to read this information carefully each time. Talk to your pediatrician regarding the use of this medicine in children. While this drug may be prescribed for children as young as 13 years for selected conditions, precautions do apply. Overdosage: If you think you have taken too much of this medicine contact a poison control center or emergency room at once. NOTE: This medicine is only for you. Do not share this medicine with others. What if I miss a dose? If you miss a dose, take it as soon as you can. If it is almost time for your next dose, take only that dose. Do not take double or extra doses. What may interact with this medicine? Do not take this medicine with any of the following medications: -certain antibiotics like grepafloxacin and sparfloxacin -certain phenothiazines like chlorpromazine, mesoridazine, and thioridazine -cisapride -clozapine -droperidol -halofantrine -levomethadyl -pimozide This medicine may also interact with the following medications: -carbamazepine -charcoal -fluvoxamine -levodopa and other medicines for Parkinson's disease -medicines for diabetes -medicines for high blood pressure -medicines for mental depression, anxiety, other mood disorders, or sleeping problems -omeprazole -rifampin -ritonavir -tobacco from cigarettes This list may not describe all possible interactions. Give your health  care provider a list of all the medicines, herbs, non-prescription drugs, or dietary supplements you use. Also tell them if you smoke, drink  alcohol, or use illegal drugs. Some items may interact with your medicine. What should I watch for while using this medicine? Visit your doctor or health care professional for regular checks on your progress. It may be several weeks before you see the full effects of this medicine. Notify your doctor or health care professional if your symptoms get worse, if you have new symptoms, if you are having an unusual effect from this medicine, or if you feel out of control, very discouraged or think you might harm yourself or others. Do not suddenly stop taking this medicine. You may need to gradually reduce the dose. Ask your doctor or health care professional for advice. You may get dizzy or drowsy. Do not drive, use machinery, or do anything that needs mental alertness until you know how this medicine affects you. Do not stand or sit up quickly, especially if you are an older patient. This reduces the risk of dizzy or fainting spells. Avoid alcoholic drinks. Alcohol can increase dizziness and drowsiness with olanzapine. Do not treat yourself for colds, diarrhea or allergies without asking your doctor or health care professional for advice. Some ingredients can increase possible side effects. Your mouth may get dry. Chewing sugarless gum or sucking hard candy, and drinking plenty of water will help. This medicine can reduce the response of your body to heat or cold. Dress warm in cold weather and stay hydrated in hot weather. If possible, avoid extreme temperatures like saunas, hot tubs, very hot or cold showers, or activities that can cause dehydration such as vigorous exercise. If you notice an increased hunger or thirst, different from your normal hunger or thirst, or if you find that you have to urinate more frequently, you should contact your health care provider as  soon as possible. You may need to have your blood sugar monitored. This medicine may cause changes in your blood sugar levels. You should monitor you blood sugar frequently if you have diabetes. If you smoke, tell your doctor if you notice this medicine is not working well for you. Talk to your doctor if you are a smoker or if you decide to stop smoking. What side effects may I notice from receiving this medicine? Side effects that you should report to your doctor or health care professional as soon as possible: -allergic reactions like skin rash, itching or hives, swelling of the face, lips, or tongue -breathing problems -difficulty in speaking or swallowing -excessive thirst and/or hunger -fast heartbeat (palpitations) -fever or chills, sore throat -fever with rash, swollen lymph nodes, or swelling of the face -frequently needing to urinate -inability to control muscle movements in the face, hands, arms, or legs -painful or prolonged erections -redness, blistering, peeling or loosening of the skin, including inside the mouth -restlessness or need to keep moving -seizures (convulsions) -stiffness, spasms -tremors or trembling Side effects that usually do not require medical attention (report to your doctor or health care professional if they continue or are bothersome): -changes in sexual desire -constipation -drowsiness -lowered blood pressure This list may not describe all possible side effects. Call your doctor for medical advice about side effects. You may report side effects to FDA at 1-800-FDA-1088. Where should I keep my medicine? Keep out of the reach of children. Store at controlled room temperature between 15 and 30 degrees C (59 and 86 degrees F). Protect from light and moisture. Throw away any unused medicine after the expiration date. NOTE: This sheet is a summary. It may not cover all  possible information. If you have questions about this medicine, talk to your doctor,  pharmacist, or health care provider.  2018 [employer] Standard (2014-11-06 17:33:14)
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
## 2017-11-10 ENCOUNTER — Encounter: Payer: Self-pay | Admitting: Psychiatry

## 2017-11-11 NOTE — Telephone Encounter (Signed)
LVM for pt to call back.

## 2017-11-12 ENCOUNTER — Encounter: Payer: Self-pay | Admitting: Obstetrics and Gynecology

## 2017-11-16 ENCOUNTER — Ambulatory Visit: Payer: 59 | Admitting: Psychiatry

## 2017-11-19 ENCOUNTER — Ambulatory Visit: Payer: 59 | Admitting: Psychiatry

## 2017-11-25 ENCOUNTER — Ambulatory Visit (INDEPENDENT_AMBULATORY_CARE_PROVIDER_SITE_OTHER): Payer: 59 | Admitting: Psychiatry

## 2017-11-25 ENCOUNTER — Other Ambulatory Visit: Payer: Self-pay

## 2017-11-25 ENCOUNTER — Encounter: Payer: Self-pay | Admitting: Psychiatry

## 2017-11-25 VITALS — BP 110/82 | HR 106 | Temp 98.4°F | Wt 214.4 lb

## 2017-11-25 DIAGNOSIS — F17200 Nicotine dependence, unspecified, uncomplicated: Secondary | ICD-10-CM

## 2017-11-25 DIAGNOSIS — F3181 Bipolar II disorder: Secondary | ICD-10-CM | POA: Diagnosis not present

## 2017-11-25 DIAGNOSIS — F4310 Post-traumatic stress disorder, unspecified: Secondary | ICD-10-CM | POA: Diagnosis not present

## 2017-11-25 DIAGNOSIS — F5105 Insomnia due to other mental disorder: Secondary | ICD-10-CM | POA: Diagnosis not present

## 2017-11-25 MED ORDER — CARBAMAZEPINE ER 100 MG PO CP12: 100.0000 mg | ORAL_CAPSULE | Freq: Two times a day (BID) | ORAL | 0 refills | Status: DC

## 2017-11-25 MED ORDER — SERTRALINE HCL 50 MG PO TABS: 50.0000 mg | ORAL_TABLET | Freq: Every day | ORAL | 1 refills | Status: DC

## 2017-11-25 NOTE — Patient Instructions (Signed)
Carbamazepine extended-release capsules (bipolar disorder) What is this medicine? CARBAMAZEPINE (kar ba MAZ e peen) is used to treat mixed and manic episodes of bipolar disorder, to control seizures caused by certain types of epilepsy, and to treat nerve related pain. It is not for common aches and pains. This medicine may be used for other purposes; ask your health care provider or pharmacist if you have questions. COMMON BRAND NAME(S): Equetro What should I tell my health care provider before I take this medicine? They need to know if you have any of these conditions: -Asian ancestry -bone marrow disease -glaucoma -heart disease or irregular heartbeat -kidney disease -liver disease -low blood counts, like low white cell, platelet, or red cell counts -porphyria -psychotic disorders -suicidal thoughts, plans, or attempt; a previous suicide attempt by you or a family member -an unusual or allergic reaction to carbamazepine, tricyclic antidepressants, phenytoin, phenobarbital other medicines, foods, dyes, or preservatives -pregnant or trying to get pregnant -breast-feeding How should I use this medicine? Take this medicine by mouth with a glass of water. Follow the directions on the prescription label. Do not cut, crush or chew this medicine. Take this medicine with or without food. The capsules can be opened and the beads sprinkled over food such as applesauce or other similar food product. Take your doses at regular intervals. Do not take your medicine more often than directed. Do not stop taking except on your doctor's advice. A special MedGuide will be given to you by the pharmacist with each prescription and refill. Be sure to read this information carefully each time. Talk to your pediatrician regarding the use of this medicine in children. Special care may be needed. Overdosage: If you think you have taken too much of this medicine contact a poison control center or emergency room at  once. NOTE: This medicine is only for you. Do not share this medicine with others. What if I miss a dose? If you miss a dose, take it as soon as you can. If it is almost time for your next dose, take only that dose. Do not take double or extra doses. What may interact with this medicine? Do not take this medicine with any of the following medications: -certain medicines used to treat HIV infection or AIDS that are given in combination with cobicistat - delavirdine - MAOIs like Carbex, Eldepryl, Marplan, Nardil, and Parnate - nefazodone - oxcarbazepine This medicine may also interact with the following medications: - acetaminophen - acetazolamide - barbiturate medicines for inducing sleep or treating seizures, like phenobarbital - certain antibiotics like clarithromycin, erythromycin or troleandomycin - cimetidine - cyclosporine - danazol - dicumarol - doxycycline - female hormones, including estrogens and birth control pills - grapefruit juice - isoniazid, INH - levothyroxine and other thyroid hormones - lithium and other medicines to treat mood problems or psychotic disturbances - loratadine - medicines for angina or high blood pressure - medicines for cancer - medicines for depression or anxiety - medicines for sleep - medicines to treat fungal infections, like fluconazole, itraconazole or ketoconazole - medicines used to treat HIV infection or AIDS - methadone - niacinamide - praziquantel - propoxyphene - rifampin or rifabutin - seizure or epilepsy medicine - steroid medicines such as prednisone or cortisone - theophylline - tramadol - warfarin This list may not describe all possible interactions. Give your health care provider a list of all the medicines, herbs, non-prescription drugs, or dietary supplements you use. Also tell them if you smoke, drink alcohol, or use illegal drugs.  Some items may interact with your medicine. What  should I watch for while using this medicine? Visit your doctor or health care professional for a regular check on your progress. Do not change brands or dosage forms of this medicine without discussing the change with your doctor or health care professional. You may get drowsy, dizzy, or have blurred vision. Do not drive, use machinery, or do anything that needs mental alertness until you know how this medicine affects you. To reduce dizzy or fainting spells, do not sit or stand up quickly, especially if you are an older patient. Alcohol can increase drowsiness and dizziness. Avoid alcoholic drinks. Birth control pills may not work properly while you are taking this medicine. Talk to your doctor about using an extra method of birth control. This medicine can make you more sensitive to the sun. Keep out of the sun. If you cannot avoid being in the sun, wear protective clothing and use sunscreen. Do not use sun lamps or tanning beds/booths. The use of this medicine may increase the chance of suicidal thoughts or actions. Pay special attention to how you are responding while on this medicine. Any worsening of mood, or thoughts of suicide or dying should be reported to your health care professional right away. Women who become pregnant while using this medicine may enroll in the [place] Pregnancy Registry by calling [id number]. This registry collects information about the safety of antiepileptic drug use during pregnancy. What side effects may I notice from receiving this medicine? Side effects that you should report to your doctor or health care professional as soon as possible: -allergic reactions like skin rash, itching or hives, swelling of the face, lips, or tongue -breathing problems -changes in vision -confusion -dark urine -fast or irregular heartbeat -fever or chills, sore throat -mouth ulcers -pain or difficulty passing urine -redness, blistering, peeling or  loosening of the skin, including inside the mouth -ringing in the ears -seizures -stomach pain -swollen joints or muscle/joint aches and pains -unusual bleeding or bruising -unusually weak or tired -vomiting -worsening of mood, thoughts or actions of suicide or dying -yellowing of the eyes or skin Side effects that usually do not require medical attention (report to your doctor or health care professional if they continue or are bothersome): -clumsiness or unsteadiness -diarrhea or constipation -headache -increased sweating -nausea This list may not describe all possible side effects. Call your doctor for medical advice about side effects. You may report side effects to FDA at 1-800-FDA-1088. Where should I keep my medicine? Keep out of the reach of children. Store at room temperature between 15 and 30 degrees C (59 and 86 degrees C). Protect from light. Throw away any unused medicine after the expiration date. NOTE: This sheet is a summary. It may not cover all possible information. If you have questions about this medicine, talk to your doctor, pharmacist, or health care provider.  2018 Elsevier/Gold Standard (2015-04-15 11:07:18)
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
## 2017-11-26 ENCOUNTER — Encounter: Payer: Self-pay | Admitting: Psychiatry

## 2017-11-26 NOTE — Progress Notes (Deleted)
Patient ID: Laura [name], female   DOB: 04-03-1986, 32 y.o.   MRN: [id number] ANNUAL PREVENTATIVE CARE GYN  ENCOUNTER NOTE  Subjective:       Laura [name] is a 32 y.o. G43P2003 female here for a routine annual gynecologic exam.  Current complaints: 1.  Bowel function is normal with the use of Colace stool softener She reports no significant bladder issues  Gynecologic History Patient's last menstrual period was 11/22/2015 (approximate). Contraception: tubal ligation- s/p lavh Last Pap: 09/2015 neg/neg/neg. Results were: normal Last mammogram: N/A. Results were: N/A  Obstetric History OB History  Gravida Para Term Preterm AB Living  2 2 2     3   SAB TAB Ectopic Multiple Live Births        1 3    # Outcome Date GA Lbr Len/2nd Weight Sex Delivery Anes PTL Lv  2A Gravida 2010   4 lb 2.1 oz (1.873 kg) F CS-LTranv   LIV  2B Term 2010   4 lb 4.8 oz (1.95 kg) F CS-LTranv   LIV  1 Term 2009   7 lb 14.4 oz (3.583 kg) M Vag-Spont   LIV    Past Medical History:  Diagnosis Date  . Anxiety   . Bipolar disorder ([place])    past hx of  . Complication of anesthesia    "woke up during surgery"  . Depression   . Heavy periods   . Incontinence of urine   . Increased BMI   . Indigestion   . PTSD (post-traumatic stress disorder)   . Tobacco user     Past Surgical History:  Procedure Laterality Date  . CESAREAN SECTION  2010  . LAPAROSCOPIC VAGINAL HYSTERECTOMY WITH SALPINGECTOMY Bilateral 12/16/2015   Procedure: LAPAROSCOPIC ASSISTED VAGINAL HYSTERECTOMY WITH BILATERAL SALPINGECTOMY;  Surgeon: Brayton Mars, MD;  Location: [facility] ORS;  Service: Gynecology;  Laterality: Bilateral;  . TUBAL LIGATION  2011    Current Outpatient Medications on File Prior to Visit  Medication Sig Dispense Refill  . carbamazepine (CARBATROL) 100 MG 12 hr capsule Take 1 capsule (100 mg total) by mouth 2 (two) times daily. 60 capsule 0  . cyanocobalamin (,VITAMIN B-12,) 1000 MCG/ML injection  Inject 1 mL (1,000 mcg total) into the muscle every 30 (thirty) days. 10 mL 1  . cyclobenzaprine (FLEXERIL) 10 MG tablet Take 1 tablet (10 mg total) by mouth 3 (three) times daily as needed for muscle spasms. No driving while taking this medication 30 tablet 0  . OLANZapine (ZYPREXA) 5 MG tablet Take 1 tablet (5 mg total) by mouth at bedtime. 30 tablet 1  . propranolol (INDERAL) 10 MG tablet Take 1 tablet (10 mg total) by mouth 3 (three) times daily as needed. Severe anxiety attacks 90 tablet 1  . sertraline (ZOLOFT) 50 MG tablet Take 1 tablet (50 mg total) by mouth daily. 30 tablet 1  . traZODone (DESYREL) 50 MG tablet Take 1 tablet (50 mg total) by mouth at bedtime. 30 tablet 1   No current facility-administered medications on file prior to visit.     Allergies  Allergen Reactions  . Amoxicillin Rash and Other (See Comments)    GI upset    Social History   Socioeconomic History  . Marital status: Married    Spouse name: Not on file  . Number of children: Not on file  . Years of education: Not on file  . Highest education level: Not on file  Occupational History  . Not on file  Social Needs  . Financial resource strain: Not on file  . Food insecurity:    Worry: Not on file    Inability: Not on file  . Transportation needs:    Medical: Not on file    Non-medical: Not on file  Tobacco Use  . Smoking status: Current Every Day Smoker    Packs/day: 0.50    Types: Cigarettes  . Smokeless tobacco: Never Used  Substance and Sexual Activity  . Alcohol use: Yes    Alcohol/week: 0.0 - 3.6 oz    Comment: occasionally; beer  . Drug use: No  . Sexual activity: Yes    Birth control/protection: Surgical  Lifestyle  . Physical activity:    Days per week: Not on file    Minutes per session: Not on file  . Stress: Not on file  Relationships  . Social connections:    Talks on phone: Not on file    Gets together: Not on file    Attends religious service: Not on file    Active  member of club or organization: Not on file    Attends meetings of clubs or organizations: Not on file    Relationship status: Not on file  . Intimate partner violence:    Fear of current or ex partner: Not on file    Emotionally abused: Not on file    Physically abused: Not on file    Forced sexual activity: Not on file  Other Topics Concern  . Not on file  Social History Narrative  . Not on file    Family History  Problem Relation Age of Onset  . Mental illness Mother   . Diabetes Maternal Grandmother   . Heart disease Maternal Grandfather   . Cancer Neg Hx     The following portions of the patient's history were reviewed and updated as appropriate: allergies, current medications, past family history, past medical history, past social history, past surgical history and problem list.  Review of Systems    Objective:   LMP 11/22/2015 (Approximate)  LMP 11/22/2015 (Approximate)   CONSTITUTIONAL: Well-developed, obese female in no acute distress.  PSYCHIATRIC: Normal mood and affect. Normal behavior. Normal judgment and thought content. [place]: Alert and oriented to person, place, and time. Normal muscle tone coordination. No cranial nerve deficit noted. HENT:  Normocephalic, atraumatic, External right and left ear normal. Oropharynx is clear and moist EYES: Conjunctivae and EOM are normal. Pupils are equal, round, and reactive to light. No scleral icterus.  NECK: Normal range of motion, supple, no masses.  Normal thyroid.  SKIN: Skin is warm and dry. No rash noted. Not diaphoretic. No erythema. No pallor. CARDIOVASCULAR: Normal heart rate noted, regular rhythm, no murmur. RESPIRATORY: Clear to auscultation bilaterally. Effort and breath sounds normal, no problems with respiration noted. BREASTS: Symmetric in size. No masses, skin changes, nipple drainage, or lymphadenopathy. ABDOMEN: Soft, normal bowel sounds, no distention noted.  No tenderness, rebound or guarding.  Pfannenstiel incision well-healed BLADDER: Normal PELVIC:  External Genitalia: Normal  BUS: Normal  Vagina: Normal with good estrogen effect  Cervix: Surgically absent  Uterus: Surgically absent  Adnexa: Normal; not palpable and nontender  RV: Not examined and External Exam NormaI  MUSCULOSKELETAL: Normal range of motion. No tenderness.  No cyanosis, clubbing, or edema.  2+ distal pulses. LYMPHATIC: No Axillary, Supraclavicular, or Inguinal Adenopathy.    Assessment:   Annual gynecologic examination 32 y.o. Contraception: tubal ligation BMI-37 Problem List Items Addressed This Visit    History  of cesarean section   Status post laparoscopic assisted vaginal hysterectomy (LAVH)   Obesity (BMI 30-39.9)    Other Visit Diagnoses    Well woman exam with routine gynecological exam    -  Primary    Decreased libido  Plan:  Pap: not needed Mammogram: Not Indicated Stool Guaiac Testing:  Not Indicated Labs: vit d tsh a1c lipid fbs Routine preventative health maintenance measures emphasized: Exercise/Diet/Weight control, Tobacco Warnings and Alcohol/Substance use risks  Return to [place], CMA     I have seen, interviewed, and examined the patient in conjunction with the [employer].A. student and affirm the diagnosis and management plan. Martin A. DeFrancesco, MD, FACOG   Note: This dictation was prepared with Dragon dictation along with smaller phrase technology. Any transcriptional errors that result from this process are unintentional.

## 2017-12-02 ENCOUNTER — Encounter: Payer: Self-pay | Admitting: Obstetrics and Gynecology

## 2017-12-03 IMAGING — US US ART/VEN ABD/PELV/SCROTUM DOPPLER LTD
1 series · 13 of 25 positions shown · non-contrast
Comparison: None.

CLINICAL DATA: Left lower quadrant pain for 3 hours. Status post
hysterectomy.

EXAM:
TRANSABDOMINAL AND TRANSVAGINAL ULTRASOUND OF PELVIS
DOPPLER ULTRASOUND OF OVARIES
TECHNIQUE: Both transabdominal and transvaginal ultrasound examinations of the
pelvis were performed. Transabdominal technique was performed for
global imaging of the pelvis including uterus, ovaries, adnexal
regions, and pelvic cul-de-sac.
It was necessary to proceed with endovaginal exam following the
transabdominal exam to visualize the ovaries. Color and duplex
Doppler ultrasound was utilized to evaluate blood flow to the
ovaries.

[Series 1: us art/ven abd/pelv/scrotum doppler ltd · 0.20mm/px · 13 of 93 slices shown]
[im 1/93]
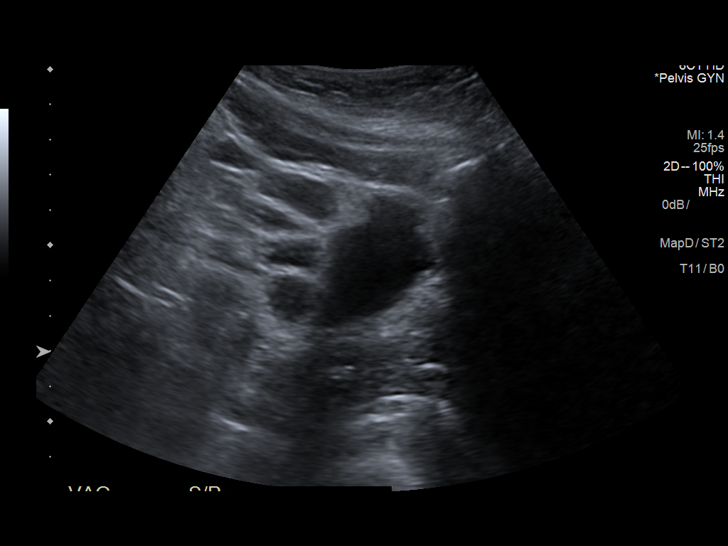
[im 8/93]
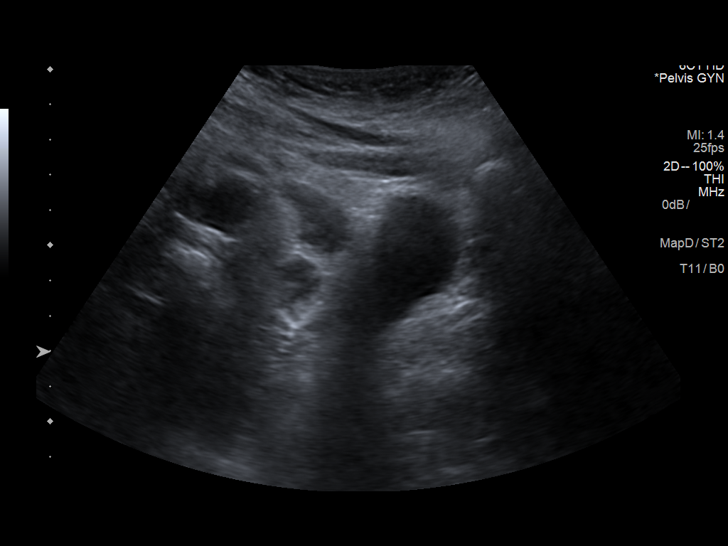
[im 16/93]
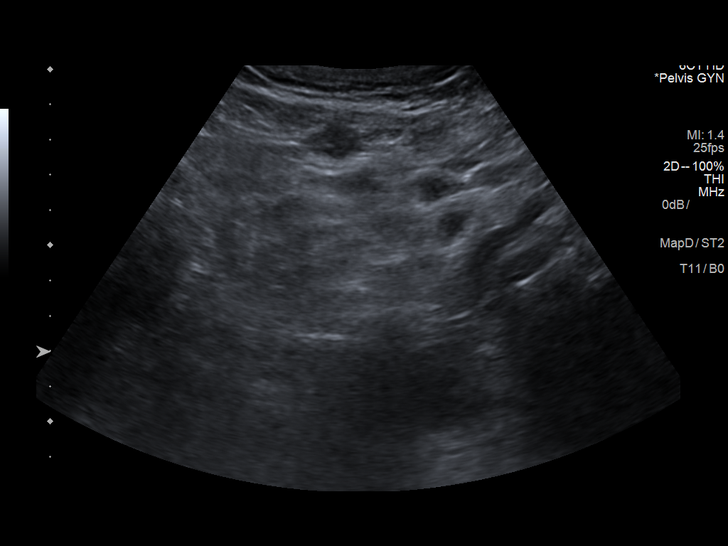
[im 24/93]
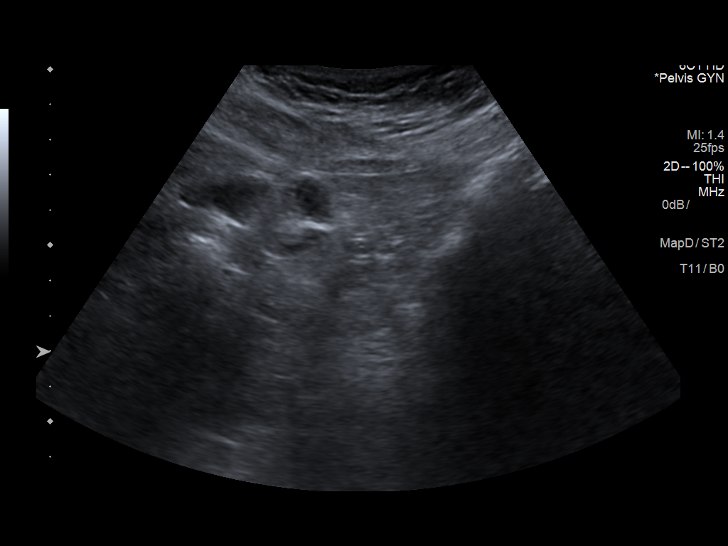
[im 31/93]
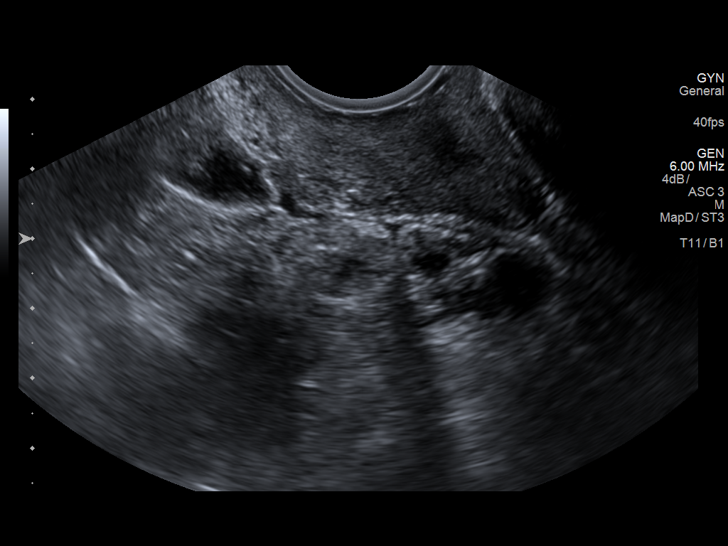
[im 39/93]
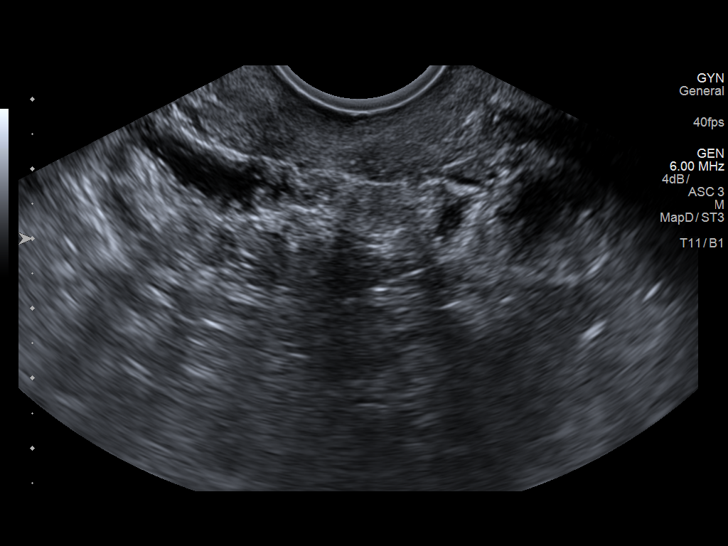
[im 47/93]
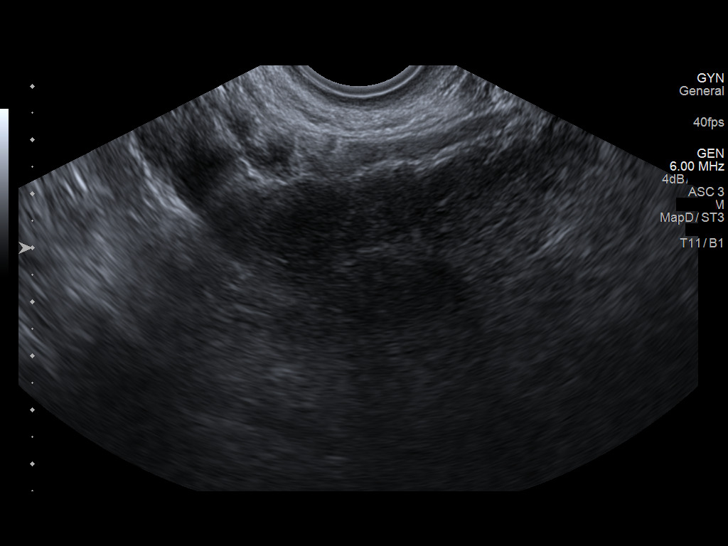
[im 54/93]
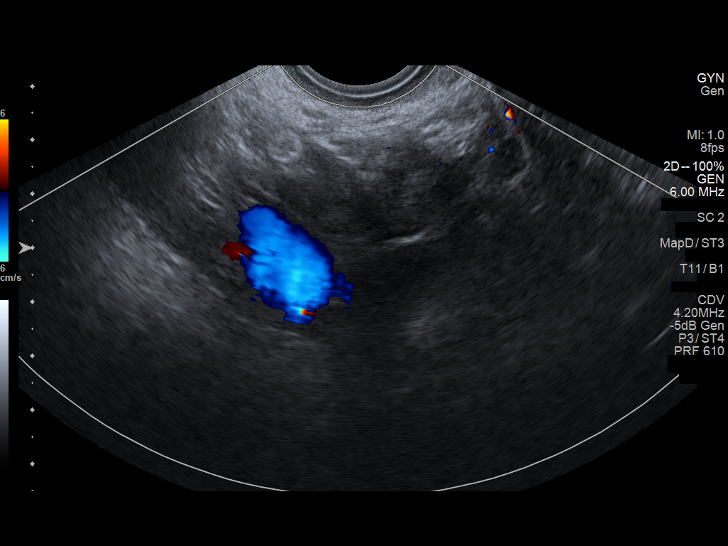
[im 62/93]
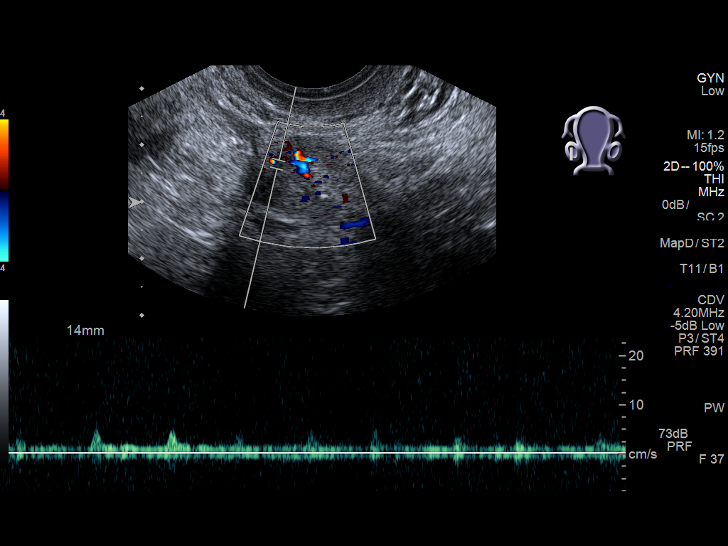
[im 70/93]
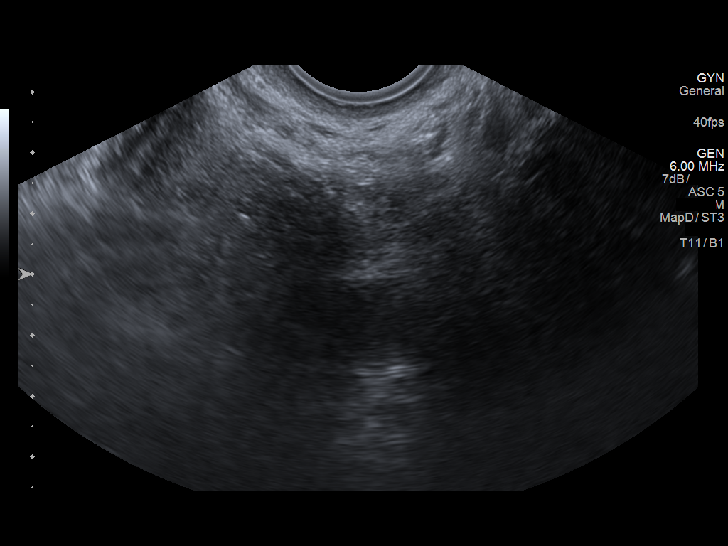
[im 77/93]
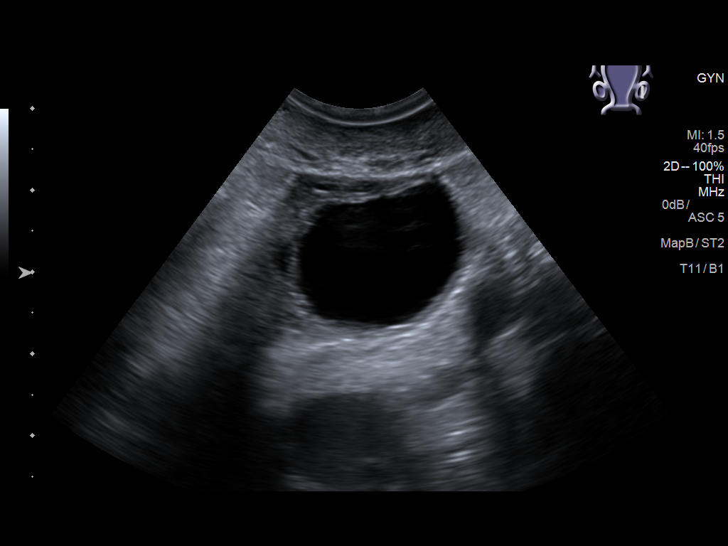
[im 85/93]
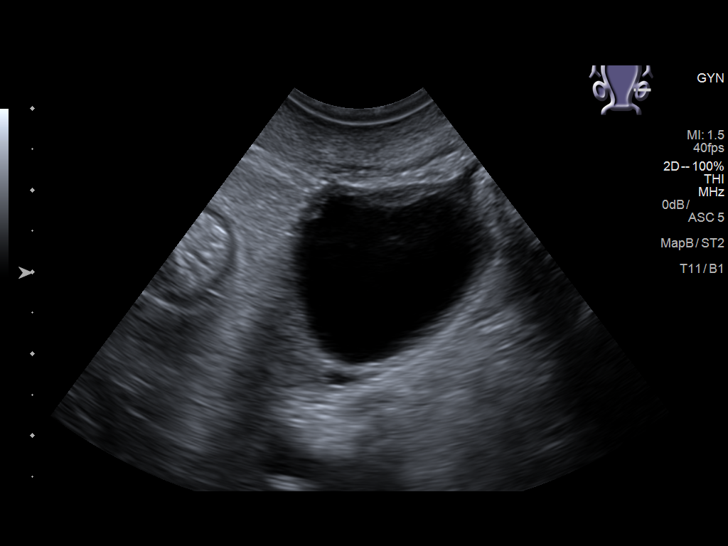
[im 93/93]
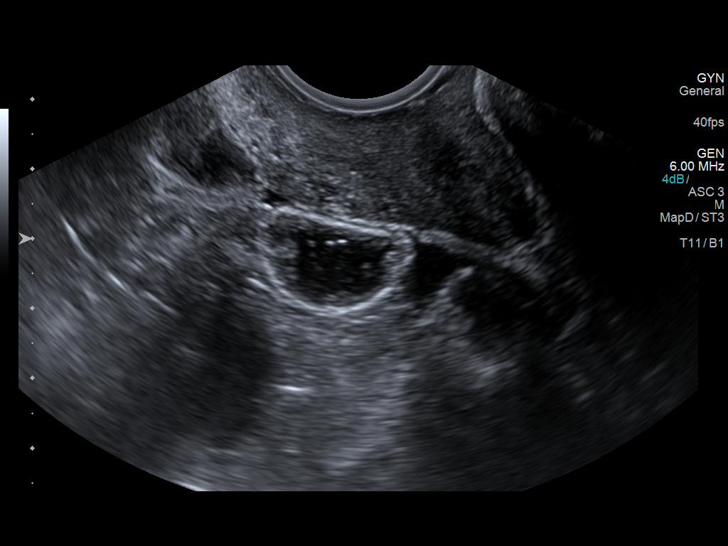

[13 of 25 positions shown; findings below may reference images not displayed]

FINDINGS: Uterus

Surgically absent.

Right ovary

Measurements: 1.6 x 1.0 x 1.6 cm. Normal appearance/no adnexal mass.

Left ovary

Measurements: 3 x 2.4 x 2.6 cm. Contains a dominant follicle
measuring up to 2.6 cm with a single thin septation.

Pulsed Doppler evaluation of both ovaries demonstrates normal
low-resistance arterial and venous waveforms. The blood flow in the
left ovary is within the soft tissues surrounding the dominant
follicle as expected.

Other findings

No abnormal free fluid.
IMPRESSION: 1. There is a dominant 2.6 cm follicle with a single thin septation
in the left ovary. The patient is status post hysterectomy. No other
abnormalities. Blood flow seen in both ovaries.

## 2017-12-10 DIAGNOSIS — D485 Neoplasm of uncertain behavior of skin: Secondary | ICD-10-CM | POA: Diagnosis not present

## 2017-12-10 DIAGNOSIS — L538 Other specified erythematous conditions: Secondary | ICD-10-CM | POA: Diagnosis not present

## 2017-12-10 DIAGNOSIS — L720 Epidermal cyst: Secondary | ICD-10-CM | POA: Diagnosis not present

## 2017-12-10 DIAGNOSIS — R208 Other disturbances of skin sensation: Secondary | ICD-10-CM | POA: Diagnosis not present

## 2017-12-13 ENCOUNTER — Telehealth: Payer: Self-pay

## 2017-12-13 NOTE — Telephone Encounter (Signed)
Medication problem - Message left for pt that her message was received stating she had stopped her recently started Carbatrol as she was having increased crying spells, agitation and not sleeping as well. Questions if should start something else be seen.  Discussed with Dr. Shea Evans who requested to have patient scheduled to return to see her sooner.

## 2017-12-14 ENCOUNTER — Ambulatory Visit (INDEPENDENT_AMBULATORY_CARE_PROVIDER_SITE_OTHER): Payer: 59 | Admitting: Psychiatry

## 2017-12-14 ENCOUNTER — Encounter: Payer: Self-pay | Admitting: Psychiatry

## 2017-12-14 ENCOUNTER — Telehealth (HOSPITAL_COMMUNITY): Payer: Self-pay | Admitting: Professional

## 2017-12-14 VITALS — BP 114/83 | HR 90 | Ht 62.0 in | Wt 214.0 lb

## 2017-12-14 DIAGNOSIS — F3181 Bipolar II disorder: Secondary | ICD-10-CM | POA: Diagnosis not present

## 2017-12-14 DIAGNOSIS — F17200 Nicotine dependence, unspecified, uncomplicated: Secondary | ICD-10-CM | POA: Diagnosis not present

## 2017-12-14 DIAGNOSIS — F4310 Post-traumatic stress disorder, unspecified: Secondary | ICD-10-CM | POA: Diagnosis not present

## 2017-12-14 MED ORDER — LITHIUM CARBONATE ER 300 MG PO TBCR: 300.0000 mg | EXTENDED_RELEASE_TABLET | Freq: Two times a day (BID) | ORAL | 2 refills | Status: DC

## 2017-12-14 MED ORDER — CLONAZEPAM 0.5 MG PO TABS: 0.5000 mg | ORAL_TABLET | Freq: Two times a day (BID) | ORAL | 2 refills | Status: DC | PRN

## 2017-12-14 MED ORDER — SERTRALINE HCL 50 MG PO TABS: 50.0000 mg | ORAL_TABLET | Freq: Every day | ORAL | 2 refills | Status: DC

## 2017-12-14 MED ORDER — OLANZAPINE 5 MG PO TABS: 7.5000 mg | ORAL_TABLET | Freq: Every day | ORAL | 2 refills | Status: DC

## 2017-12-14 NOTE — Patient Instructions (Signed)
Lithium extended-release tablets  What is this medicine?  LITHIUM (LITH ee um) is used to prevent and treat the manic episodes caused by manic-depressive illness.  This medicine may be used for other purposes; ask your health care provider or pharmacist if you have questions.  COMMON BRAND NAME(S): Eskalith CR, Lithobid  What should I tell my health care provider before I take this medicine?  They need to know if you have any of these conditions:  -active infection  -Brugada Syndrome  -dehydration (diarrhea or sweating)  -diet low in salt  -heart disease  -high levels of calcium in the blood  -history of irregular heartbeat  -kidney disease  -low level of potassium or sodium in the blood  -parathyroid disease  -problems urinating  -thyroid disease  -an unusual or allergic reaction to lithium, other medicines, foods, dyes, or preservatives  -pregnant or trying to get pregnant  -breast-feeding  How should I use this medicine?  Take this medicine by mouth with a glass of water. Follow the directions on the prescription label. Swallow the tablets whole. Do not break, crush or chew. Take after a meal or snack to avoid stomach upset. Take your doses at regular intervals. Do not take your medicine more often than directed. The amount of this medicine you take is very important. Taking more than the prescribed dose can cause serious side effects. Do not stop taking except on your the advice of your doctor or health care professional.  Talk to your pediatrician regarding the use of this medicine in children. Special care may be needed. While this drug may be prescribed for children as young as 12 years for selected conditions, precautions do apply.  Overdosage: If you think you have taken too much of this medicine contact a poison control center or emergency room at once.  NOTE: This medicine is only for you. Do not share this medicine with others.  What if I miss a dose?  If you miss a dose, take it as soon as you can. If  it is almost time for your next dose, take only that dose. Do not take double or extra doses.  What may interact with this medicine?  Do not take this medicine with any of the following medications:  -cisapride  -dofetilide  -dronedarone  -pimozide  -stimulant medicines used to treat ADHD or narcolepsy  -thioridazine  -ziprasidone  This medicine may also interact with the following medications:  -buspirone  -caffeine  -calcium iodide  -carbamazepine  -certain medicines for depression, anxiety, or psychotic disturbances  -certain medicines for migraine headache like almotriptan, eletriptan, frovatriptan, naratriptan, rizatriptan, sumatriptan, zolmitriptan  -diuretics  -fentanyl  -linezolid  -MAOIs like Carbex, Eldepryl, Marplan, Nardil, and Parnate  -medicines for high blood pressure  -medicines that relax muscles for surgery  -metronidazole  -NSAIDs, medicines for pain and inflammation, like ibuprofen or naproxen  -other medicines that prolong the QT interval (cause an abnormal heart rhythm)  -phenytoin  -potassium iodide, KI  -sodium bicarbonate  -sodium chloride  -St. John's Wort  -tramadol  -tryptophan  -urea  This list may not describe all possible interactions. Give your health care provider a list of all the medicines, herbs, non-prescription drugs, or dietary supplements you use. Also tell them if you smoke, drink alcohol, or use illegal drugs. Some items may interact with your medicine.  What should I watch for while using this medicine?  Visit your doctor or health care professional for regular checks on your progress. It can   take several weeks of treatment before you start to get better.  The amount of salt (sodium) in your body influences the effects of this medicine, and this medicine can increase salt loss from the body. Eat a normal diet that includes salt. Do not change to salt substitutes. Avoid changes involving diet, or medications that include large amounts of sodium like sodium bicarbonate. Ask  your doctor or health care professional for advice if you are not sure.  Drink plenty of fluids while you are taking this medicine. Avoid drinks that contain caffeine, such as coffee, tea and colas. You will need extra fluids if you have diarrhea or sweat a lot. This will help prevent toxic effects from this medicine. Be careful not to get overheated during exercise, saunas, hot baths, and hot weather. Consult your doctor or health care professional if you have a high fever or persistent diarrhea.  You may get drowsy or dizzy. Do not drive, use machinery, or do anything that needs mental alertness until you know how this medicine affects you. Do not stand or sit up quickly, especially if you are an older patient. This reduces the risk of dizzy or fainting spells.  What side effects may I notice from receiving this medicine?  Side effects that you should report to your doctor or health care professional as soon as possible:  -allergic reactions like skin rash, itching or hives, swelling of the face, lips, or tongue  -blurred vision  -breathing problems  -clumsiness or loss of balance  -confusion  -difficulty speaking or swallowing  -dizziness  -feeling faint or lightheaded, falls  -increased thirst  -increased urination  -loss of appetite  -muscle weakness  -nausea, vomiting  -pain, coldness, or blue coloration of fingers or toes  -sensitivity to cold  -seizures  -slow, fast, or irregular heartbeat (palpitations)  -slurred speech  -swelling in the neck  -unusually weak or tired  Side effects that usually do not require medical attention (report to your doctor or health care professional if they continue or are bothersome):  -acne  -diarrhea  -mild tremor  -stomach pain  -weight gain  This list may not describe all possible side effects. Call your doctor for medical advice about side effects. You may report side effects to FDA at 1-800-FDA-1088.  Where should I keep my medicine?  Keep out of the reach of  children.  Store at room temperature between 15 and 30 degrees C (59 and 86 degrees F). Keep container tightly closed. Protect from light. Throw away any unused medicine after the expiration date.  NOTE: This sheet is a summary. It may not cover all possible information. If you have questions about this medicine, talk to your doctor, pharmacist, or health care provider.   2018 Elsevier/Gold Standard (2015-06-12 17:25:37)

## 2017-12-14 NOTE — Progress Notes (Signed)
[facility] MD OP Progress Note  12/14/2017 2:03 PM Laura [name]  MRN:  [id number]  Chief Complaint: ' I am here for follow up." Chief Complaint    Follow-up     HPI: Claudett is a 32 old African-American female, married, employed, lives in [place], has a history of bipolar disorder type II, PTSD, presented to the clinic today for a follow-up visit.  Patient today reports she has been very distressed recently.  She reports irritability, outburst, crying spells and significant panic symptoms.  She reports she keeps feeling that the whole family depends on her and there is no one to share her pain.  She reports her husband was unable to get a job that was offered.  He is currently looking for another job.  Patient also takes care of her grandmother who is in rehabilitation, patient has 3 children whom she takes care of.  Patient reports she also has financial issues.  She reports she is currently facing eviction.  She reports she has been going to the food bank to support her family.  Patient reports she took the carbamazepine however she felt her mood lability worsen.  She reports on Saturday she slept the whole day.  She reports that is not like her and she has never done it before.  She reports she hence stopped taking the carbamazepine.  She has been tolerating the Zyprexa well.  Reports she has been more spaced out at work.  She reports she is unable to focus  like she used to before.  She reports significant panic symptoms.  She reports racing heart rate, shortness of breath and chest pain.  She reports she tried taking the propranolol and the hydroxyzine and they have not been very beneficial.  Patient reports there has been times when she got so frustrated that she has gone out for a drive.  She reports she did not have any suicidality but she keeps thinking about not wanting to be here and wanting to get away from this all.  She denies any active suicidal plans.  Discussed medication  changes with patient.  However discussed with patient that since she is struggling with a lot of mood symptoms and psychosocial stressors she would benefit from an IOP or PHP program.  Patient reports she is willing to discussed this and wants to know more.  Will refer her to Ms. Velva Harman.   Visit Diagnosis:    ICD-10-CM   1. Bipolar 2 disorder, major depressive episode ([place]) F31.81   2. Tobacco use disorder F17.200   3. Post-traumatic stress disorder F43.10     Past Psychiatric History: Have reviewed past psychiatric history from my progress note on 11/09/2017.  Past trials of medications like Zoloft, Prozac, Celexa, Lexapro, Abilify, Latuda, Wellbutrin, Paxil, Depakote, Seroquel, trazodone,Trintellix, Carbamazepine.  Past Medical History:  Past Medical History:  Diagnosis Date  . Anxiety   . Bipolar disorder ([place])    past hx of  . Complication of anesthesia    "woke up during surgery"  . Depression   . Heavy periods   . Incontinence of urine   . Increased BMI   . Indigestion   . PTSD (post-traumatic stress disorder)   . Tobacco user     Past Surgical History:  Procedure Laterality Date  . CESAREAN SECTION  2010  . LAPAROSCOPIC VAGINAL HYSTERECTOMY WITH SALPINGECTOMY Bilateral 12/16/2015   Procedure: LAPAROSCOPIC ASSISTED VAGINAL HYSTERECTOMY WITH BILATERAL SALPINGECTOMY;  Surgeon: Brayton Mars, MD;  Location: [facility] ORS;  Service:  Gynecology;  Laterality: Bilateral;  . TUBAL LIGATION  2011    Family Psychiatric History: Reviewed family psychiatric history from my progress note on 11/09/2017  Family History:  Family History  Problem Relation Age of Onset  . Mental illness Mother   . Diabetes Maternal Grandmother   . Heart disease Maternal Grandfather   . Cancer Neg Hx     Social History: Reviewed social history from my progress note on 11/09/2017 Social History   Socioeconomic History  . Marital status: Married    Spouse name: Not on file  . Number of children: Not  on file  . Years of education: Not on file  . Highest education level: Not on file  Occupational History  . Not on file  Social Needs  . Financial resource strain: Not on file  . Food insecurity:    Worry: Not on file    Inability: Not on file  . Transportation needs:    Medical: Not on file    Non-medical: Not on file  Tobacco Use  . Smoking status: Current Every Day Smoker    Packs/day: 0.50    Types: Cigarettes  . Smokeless tobacco: Never Used  Substance and Sexual Activity  . Alcohol use: Yes    Alcohol/week: 0.0 - 3.6 oz    Comment: occasionally; beer  . Drug use: No  . Sexual activity: Yes    Birth control/protection: Surgical  Lifestyle  . Physical activity:    Days per week: Not on file    Minutes per session: Not on file  . Stress: Not on file  Relationships  . Social connections:    Talks on phone: Not on file    Gets together: Not on file    Attends religious service: Not on file    Active member of club or organization: Not on file    Attends meetings of clubs or organizations: Not on file    Relationship status: Not on file  Other Topics Concern  . Not on file  Social History Narrative  . Not on file    Allergies:  Allergies  Allergen Reactions  . Amoxicillin Rash and Other (See Comments)    GI upset    Metabolic Disorder Labs: Lab Results  Component Value Date   HGBA1C 5.4 11/05/2016   No results found for: PROLACTIN Lab Results  Component Value Date   CHOL 159 11/05/2016   TRIG 32 11/05/2016   HDL 53 11/05/2016   CHOLHDL 3.0 11/05/2016   LDLCALC 100 (H) 11/05/2016   Lab Results  Component Value Date   TSH 0.998 03/02/2017   TSH 0.398 (L) 11/05/2016    Therapeutic Level Labs: No results found for: LITHIUM No results found for: VALPROATE No components found for:  CBMZ  Current Medications: Current Outpatient Medications  Medication Sig Dispense Refill  . clonazePAM (KLONOPIN) 0.5 MG tablet Take 1 tablet (0.5 mg total) by  mouth 2 (two) times daily as needed for anxiety. Only for severe anxiety sx 45 tablet 2  . cyanocobalamin (,VITAMIN B-12,) 1000 MCG/ML injection Inject 1 mL (1,000 mcg total) into the muscle every 30 (thirty) days. 10 mL 1  . cyclobenzaprine (FLEXERIL) 10 MG tablet Take 1 tablet (10 mg total) by mouth 3 (three) times daily as needed for muscle spasms. No driving while taking this medication 30 tablet 0  . lithium carbonate (LITHOBID) 300 MG CR tablet Take 1 tablet (300 mg total) by mouth 2 (two) times daily with a meal. 60 tablet  2  . OLANZapine (ZYPREXA) 5 MG tablet Take 1.5 tablets (7.5 mg total) by mouth at bedtime. 45 tablet 2  . sertraline (ZOLOFT) 50 MG tablet Take 1 tablet (50 mg total) by mouth daily. 30 tablet 2  . traZODone (DESYREL) 50 MG tablet Take 1 tablet (50 mg total) by mouth at bedtime. 30 tablet 1   No current facility-administered medications for this visit.      Musculoskeletal: Strength & Muscle Tone: within normal limits Gait & Station: normal Patient leans: N/A  Psychiatric Specialty Exam: Review of Systems  Psychiatric/Behavioral: Positive for depression. The patient is nervous/anxious.   All other systems reviewed and are negative.   Blood pressure 114/83, pulse 90, height 5\' 2"  (1.575 m), weight 214 lb (97.1 kg), last menstrual period 11/22/2015, SpO2 97 %.Body mass index is 39.14 kg/m.  General Appearance: Casual  Eye Contact:  Fair  Speech:  Normal Rate  Volume:  Normal  Mood:  Anxious, Depressed and Dysphoric  Affect:  Depressed and Tearful  Thought Process:  Goal Directed and Descriptions of Associations: Intact  Orientation:  Full (Time, Place, and Person)  Thought Content: Logical   Suicidal Thoughts:  No  Homicidal Thoughts:  No  Memory:  Immediate;   Fair Recent;   Fair Remote;   Fair  Judgement:  Fair  Insight:  Fair  Psychomotor Activity:  Normal  Concentration:  Concentration: Fair and Attention Span: Fair  Recall:  [employer] of  Knowledge: Fair  Language: Fair  Akathisia:  No  Handed:  Right  AIMS (if indicated): denies tremors, rigidity  Assets:  Communication Skills Desire for Improvement Housing Intimacy  ADL's:  Intact  Cognition: WNL  Sleep:  Fair   Screenings: GAD-7     Office Visit from 03/31/2017 in [facility] Visit from 03/02/2017 in [facility] Visit from 10/14/2016 in [facility] Visit from 07/22/2016 in [facility]  Total GAD-7 Score  15  17  17  12     PHQ2-9     Office Visit from 07/15/2017 in [facility] Visit from 03/31/2017 in [facility] Visit from 03/02/2017 in [facility] Visit from 10/14/2016 in [facility] from 07/29/2016 in [facility]  PHQ-2 Total Score  2  0  2  5  1   PHQ-9 Total Score  13  -  -  21  -       Assessment and Plan: Paulina is a 32 year old African-American female who has a history of bipolar disorder type II, PTSD, sleep problems, presented to the clinic today for a follow-up visit.  Patient currently did reports significant mood lability, depressive symptoms as well as panic attacks.  Discussed medication readjustment as well as referral for IOP/PHP.  Discussed plan as noted below.  Plan Bipolar disorder type II Increase Zyprexa to 7.5 mg p.o. nightly Discontinue carbamazepine for side effects. Continue Zoloft 50 mg p.o. daily. Add lithium 300 mg p.o. twice daily.  Discussed with her about the risk of being on lithium including the need to stay hydrated.  Will get lithium level in 5 days.  Provided lab slip.  We will also get TSH and BMP.  PTSD Continue Zoloft 50 mg p.o. daily Patient has been referred to Ms. Miguel Dibble. Will add Klonopin 0.5 mg p.o. twice daily as needed for significant panic symptoms.  Provided education about being on benzodiazepine therapy.  Discussed with patient to  use it  only if she has significant panic attacks. She reports poor benefit from hydroxyzine, propranolol.    Insomnia Zyprexa 7.5 mg p.o. nightly  For panic attacks Klonopin as discussed above.  Will refer patient to IOP/PHP program.  Ms. Velva Harman with [facility] to contact patient.  Follow-up in clinic in 1-2 weeks or sooner if needed.  Crisis plan discussed with patient.  Patient to go to the nearest emergency department if her symptoms worsens.  More than 50 % of the time was spent for psychoeducation and supportive psychotherapy and care coordination.  This note was generated in part or whole with voice recognition software. Voice recognition is usually quite accurate but there are transcription errors that can and very often do occur. I apologize for any typographical errors that were not detected and corrected.         Ursula Alert, MD 12/15/2017, 9:25 AM

## 2017-12-15 ENCOUNTER — Encounter: Payer: Self-pay | Admitting: Psychiatry

## 2017-12-21 ENCOUNTER — Other Ambulatory Visit: Payer: Self-pay | Admitting: Psychiatry

## 2017-12-21 DIAGNOSIS — F3181 Bipolar II disorder: Secondary | ICD-10-CM | POA: Diagnosis not present

## 2017-12-22 LAB — BASIC METABOLIC PANEL
BUN / CREAT RATIO: 11 (ref 9–23)
BUN: 9 mg/dL (ref 6–20)
CO2: 21 mmol/L (ref 20–29)
CREATININE: 0.79 mg/dL (ref 0.57–1.00)
Calcium: 8.8 mg/dL (ref 8.7–10.2)
Chloride: 104 mmol/L (ref 96–106)
GFR calc Af Amer: 115 mL/min/{1.73_m2} (ref >59)
GFR calc non Af Amer: 100 mL/min/{1.73_m2} (ref >59)
GLUCOSE: 93 mg/dL (ref 65–99)
POTASSIUM: 4.5 mmol/L (ref 3.5–5.2)
SODIUM: 138 mmol/L (ref 134–144)

## 2017-12-22 LAB — LITHIUM LEVEL: LITHIUM LVL: 0.3 mmol/L — AB (ref 0.6–1.2)

## 2017-12-22 LAB — TSH: TSH: 0.828 u[IU]/mL (ref 0.450–4.500)

## 2017-12-23 ENCOUNTER — Other Ambulatory Visit: Payer: Self-pay

## 2017-12-23 ENCOUNTER — Ambulatory Visit (INDEPENDENT_AMBULATORY_CARE_PROVIDER_SITE_OTHER): Payer: 59 | Admitting: Psychiatry

## 2017-12-23 ENCOUNTER — Encounter: Payer: Self-pay | Admitting: Psychiatry

## 2017-12-23 VITALS — BP 120/83 | HR 111 | Temp 98.7°F | Wt 217.2 lb

## 2017-12-23 DIAGNOSIS — F17200 Nicotine dependence, unspecified, uncomplicated: Secondary | ICD-10-CM | POA: Diagnosis not present

## 2017-12-23 DIAGNOSIS — F410 Panic disorder [episodic paroxysmal anxiety] without agoraphobia: Secondary | ICD-10-CM

## 2017-12-23 DIAGNOSIS — F4310 Post-traumatic stress disorder, unspecified: Secondary | ICD-10-CM | POA: Diagnosis not present

## 2017-12-23 DIAGNOSIS — F3181 Bipolar II disorder: Secondary | ICD-10-CM | POA: Diagnosis not present

## 2017-12-23 MED ORDER — LITHIUM CARBONATE ER 450 MG PO TBCR: 450.0000 mg | EXTENDED_RELEASE_TABLET | Freq: Two times a day (BID) | ORAL | 2 refills | Status: DC

## 2017-12-23 NOTE — Progress Notes (Signed)
[place] MD OP Progress Note  12/23/2017 5:16 PM Laura [name]  MRN:  [id number]  Chief Complaint: ' I am here for follow up.' Chief Complaint    Follow-up; Medication Refill     HPI: Laura [name] is a 32 year old African-American female, married, employed, lives in [place], has a history of bipolar disorder type II, PTSD, presented to the clinic today for a follow-up visit.  Patient today reports that since her last visit with writer on 12/14/2017 she has noticed some improvement in her mood symptoms.  She denies any significant outburst of crying spells.  She reports she is tolerating the lithium well.  She denies any side effects.  She reports she did have a few panic attacks however she was able to cope with it better.  She also has been using the Klonopin up to 1-2 times per week.  She reports she has been limiting use as much as she can.  Patient reports sleep is good.  She has not been using the trazodone.  The Zyprexa has been effective with her sleep too.  She reports she has been talking to her pastor.  That has also helped her to focus on the positive things in her life.  She reports her husband has been more supportive.  She reports she was able to talk to her landlord and they were able to come up with the financial agreement and hence she will not be evicted.  That has been a relief.  Patient reports she received a call for [facility] program but decided not to go for it since she has to take time off from work and she was feeling better.  She however has an appointment scheduled with Ms. Miguel Dibble and is hoping to keep it.   Visit Diagnosis:    ICD-10-CM   1. Bipolar 2 disorder, major depressive episode ([place]) F31.81   2. Tobacco use disorder F17.200   3. Post-traumatic stress disorder F43.10   4. Panic attacks F41.0     Past Psychiatric History: I have reviewed past psychiatric history from my progress note on 11/09/2017.  Past trials of medications like Zoloft, Prozac, Celexa,  Lexapro, Abilify, Latuda, Wellbutrin, Paxil, Depakote, Seroquel, trazodone, carbamazepine,Trintellix.  Past Medical History:  Past Medical History:  Diagnosis Date  . Anxiety   . Bipolar disorder ([place])    past hx of  . Complication of anesthesia    "woke up during surgery"  . Depression   . Heavy periods   . Incontinence of urine   . Increased BMI   . Indigestion   . PTSD (post-traumatic stress disorder)   . Tobacco user     Past Surgical History:  Procedure Laterality Date  . CESAREAN SECTION  2010  . LAPAROSCOPIC VAGINAL HYSTERECTOMY WITH SALPINGECTOMY Bilateral 12/16/2015   Procedure: LAPAROSCOPIC ASSISTED VAGINAL HYSTERECTOMY WITH BILATERAL SALPINGECTOMY;  Surgeon: Brayton Mars, MD;  Location: [facility] ORS;  Service: Gynecology;  Laterality: Bilateral;  . TUBAL LIGATION  2011    Family Psychiatric History: Reviewed family psychiatric history from my progress note on 11/09/2017.  Family History:  Family History  Problem Relation Age of Onset  . Mental illness Mother   . Diabetes Maternal Grandmother   . Heart disease Maternal Grandfather   . Cancer Neg Hx    Substance abuse history: Denies  Social History: Reviewed social history from my progress note on 11/09/2017. Social History   Socioeconomic History  . Marital status: Married    Spouse name: Not on file  .  Number of children: Not on file  . Years of education: Not on file  . Highest education level: Not on file  Occupational History  . Not on file  Social Needs  . Financial resource strain: Not on file  . Food insecurity:    Worry: Not on file    Inability: Not on file  . Transportation needs:    Medical: Not on file    Non-medical: Not on file  Tobacco Use  . Smoking status: Current Every Day Smoker    Packs/day: 0.50    Types: Cigarettes  . Smokeless tobacco: Never Used  Substance and Sexual Activity  . Alcohol use: Yes    Alcohol/week: 0.0 - 3.6 oz    Comment: occasionally; beer  . Drug  use: No  . Sexual activity: Yes    Birth control/protection: Surgical  Lifestyle  . Physical activity:    Days per week: Not on file    Minutes per session: Not on file  . Stress: Not on file  Relationships  . Social connections:    Talks on phone: Not on file    Gets together: Not on file    Attends religious service: Not on file    Active member of club or organization: Not on file    Attends meetings of clubs or organizations: Not on file    Relationship status: Not on file  Other Topics Concern  . Not on file  Social History Narrative  . Not on file    Allergies:  Allergies  Allergen Reactions  . Amoxicillin Rash and Other (See Comments)    GI upset    Metabolic Disorder Labs: Lab Results  Component Value Date   HGBA1C 5.4 11/05/2016   No results found for: PROLACTIN Lab Results  Component Value Date   CHOL 159 11/05/2016   TRIG 32 11/05/2016   HDL 53 11/05/2016   CHOLHDL 3.0 11/05/2016   LDLCALC 100 (H) 11/05/2016   Lab Results  Component Value Date   TSH 0.998 03/02/2017   TSH 0.398 (L) 11/05/2016    Therapeutic Level Labs: No results found for: LITHIUM No results found for: VALPROATE No components found for:  CBMZ  Current Medications: Current Outpatient Medications  Medication Sig Dispense Refill  . clonazePAM (KLONOPIN) 0.5 MG tablet Take 1 tablet (0.5 mg total) by mouth 2 (two) times daily as needed for anxiety. Only for severe anxiety sx 45 tablet 2  . cyanocobalamin (,VITAMIN B-12,) 1000 MCG/ML injection Inject 1 mL (1,000 mcg total) into the muscle every 30 (thirty) days. 10 mL 1  . cyclobenzaprine (FLEXERIL) 10 MG tablet Take 1 tablet (10 mg total) by mouth 3 (three) times daily as needed for muscle spasms. No driving while taking this medication 30 tablet 0  . OLANZapine (ZYPREXA) 5 MG tablet Take 1.5 tablets (7.5 mg total) by mouth at bedtime. 45 tablet 2  . sertraline (ZOLOFT) 50 MG tablet Take 1 tablet (50 mg total) by mouth daily. 30  tablet 2  . lithium carbonate (ESKALITH) 450 MG CR tablet Take 1 tablet (450 mg total) by mouth 2 (two) times daily. 60 tablet 2   No current facility-administered medications for this visit.      Musculoskeletal: Strength & Muscle Tone: within normal limits Gait & Station: normal Patient leans: N/A  Psychiatric Specialty Exam: Review of Systems  Psychiatric/Behavioral: Positive for depression. The patient is nervous/anxious.   All other systems reviewed and are negative.   Blood pressure 120/83, pulse Marland Kitchen)  111, temperature 98.7 F (37.1 C), temperature source Oral, weight 217 lb 3.2 oz (98.5 kg), last menstrual period 11/22/2015.Body mass index is 39.73 kg/m.  General Appearance: Casual  Eye Contact:  Fair  Speech:  Clear and Coherent  Volume:  Normal  Mood:  Anxious and Dysphoric  Affect:  Appropriate  Thought Process:  Goal Directed and Descriptions of Associations: Intact  Orientation:  Full (Time, Place, and Person)  Thought Content: Logical   Suicidal Thoughts:  No  Homicidal Thoughts:  No  Memory:  Immediate;   Fair Recent;   Fair Remote;   Fair  Judgement:  Fair  Insight:  Fair  Psychomotor Activity:  Normal  Concentration:  Concentration: Fair and Attention Span: Fair  Recall:  [employer] of Knowledge: Fair  Language: Fair  Akathisia:  No  Handed:  Right  AIMS (if indicated): denies tremors , rigidity, stiffness.  Assets:  Communication Skills Desire for Improvement Social Support  ADL's:  Intact  Cognition: WNL  Sleep:  Fair   Screenings: GAD-7     Office Visit from 03/31/2017 in [place] Office Visit from 03/02/2017 in [place] Visit from 10/14/2016 in [place] Visit from 07/22/2016 in [place]  Total GAD-7 Score  15  17  17  12     PHQ2-9     Office Visit from 07/15/2017 in [place] Visit from 03/31/2017 in [place] Visit from 03/02/2017  in [place] Visit from 10/14/2016 in [place] from 07/29/2016 in [place]  PHQ-2 Total Score  2  0  2  5  1   PHQ-9 Total Score  13  -  -  21  -       Assessment and Plan: Laura [name] is a 32 year old African-American female who has a history of bipolar disorder type II, PTSD, sleep problems, panic attacks, presented to the clinic today for a follow-up visit.  Patient recently had some worsening mood symptoms and was referred to [facility] program.  Patient however did not go to the [facility] program due to her work scheduling concerns.  She however today reports the medications as effective and is motivated to start psychotherapy with Ms. Miguel Dibble soon.  She reports improvement in her mood symptoms.  Plan as noted below.  Plan Bipolar disorder type II Continue Zyprexa 7.5 mg p.o. nightly Continue Zoloft 50 mg p.o. daily Increase lithium to 450 mg p.o. twice daily.  The lithium dosage is being increased today since her lithium level came back subtherapeutic on 12/21/2017-0.3.  Provided her with another lab slip to get lithium levels drawn in 5 days from today.  Panic attacks Added Klonopin 0.5 mg p.o. twice daily as needed for  significant panic symptoms.  Patient however has been limiting use.  She is aware about the risk of being on benzodiazepine therapy.  She has had poor benefits from hydroxyzine and propranolol in the past.   PTSD Continue Zoloft 50 mg p.o. daily Patient will start psychotherapy with Ms. Miguel Dibble, has appointment scheduled for July.  Insomnia Zyprexa 7.5 mg p.o. Nightly.  Follow-up in clinic in 6 weeks.  Patient is aware that writer will not be in clinic parts of July.  Discussed with patient to go to the nearest emergency department or walk-in clinic in [place] on Saturdays if she has worsening mood symptoms.  Patient however we will get her lithium level on  Tuesday of next week so that her  lithium level can be discussed and necessary medication readjustments be made.  More than 50 % of the time was spent for psychoeducation and supportive psychotherapy and care coordination.  This note was generated in part or whole with voice recognition software. Voice recognition is usually quite accurate but there are transcription errors that can and very often do occur. I apologize for any typographical errors that were not detected and corrected.        Ursula Alert, MD 12/24/2017, 9:38 AM

## 2017-12-24 ENCOUNTER — Encounter: Payer: Self-pay | Admitting: Psychiatry

## 2017-12-27 ENCOUNTER — Encounter: Payer: 59 | Admitting: Family Medicine

## 2017-12-29 ENCOUNTER — Other Ambulatory Visit: Payer: Self-pay | Admitting: Psychiatry

## 2017-12-29 DIAGNOSIS — F3181 Bipolar II disorder: Secondary | ICD-10-CM | POA: Diagnosis not present

## 2017-12-30 LAB — LITHIUM LEVEL: Lithium Lvl: 0.5 mmol/L — ABNORMAL LOW (ref 0.6–1.2)

## 2017-12-31 ENCOUNTER — Telehealth: Payer: Self-pay | Admitting: Psychiatry

## 2017-12-31 NOTE — Telephone Encounter (Signed)
Called pt to discuss Li level. Left message.

## 2018-02-01 ENCOUNTER — Other Ambulatory Visit: Payer: Self-pay | Admitting: Family Medicine

## 2018-02-01 MED ORDER — CYCLOBENZAPRINE HCL 10 MG PO TABS: 10.0000 mg | ORAL_TABLET | Freq: Three times a day (TID) | ORAL | 0 refills | Status: DC | PRN

## 2018-02-03 ENCOUNTER — Ambulatory Visit: Payer: 59 | Admitting: Psychiatry

## 2018-03-14 ENCOUNTER — Other Ambulatory Visit: Payer: Self-pay | Admitting: Psychiatry

## 2018-03-25 ENCOUNTER — Telehealth: Payer: 59 | Admitting: Family

## 2018-03-25 DIAGNOSIS — M5442 Lumbago with sciatica, left side: Secondary | ICD-10-CM

## 2018-03-25 DIAGNOSIS — G8929 Other chronic pain: Secondary | ICD-10-CM

## 2018-03-25 MED ORDER — PREDNISONE 5 MG PO TABS: 5.0000 mg | ORAL_TABLET | ORAL | 0 refills | Status: DC

## 2018-03-25 MED ORDER — BACLOFEN 10 MG PO TABS: 10.0000 mg | ORAL_TABLET | Freq: Three times a day (TID) | ORAL | 0 refills | Status: DC | PRN

## 2018-03-25 NOTE — Progress Notes (Signed)
Thank you for the details you included in the comment boxes. Those details are very helpful in determining the best course of treatment for you and help [place] to provide the best care. I see this is something you've been dealing with for quite some time. I'm sorry it is bothering you today. I'm treating you as below, but if you continue to have frequent flare-ups, it would be good to have another face to face and see if they have any ideas after an exam.   We are sorry that you are not feeling well.  Here is how we plan to help!  Based on what you have shared with me it looks like you mostly have acute back pain.  Acute back pain is defined as musculoskeletal pain that can resolve in 1-3 weeks with conservative treatment.  You can use tylenol and low-dose ibuprofen. However, don't use higher dose NSAIDS (aspirin, advil, etc) as this is dangerous with your prozac. I am sending a prednisone dose pack since we can't send a strong anti-inflammatory. This will help as well as Baclofen 10 mg every eight hours as needed which is a muscle relaxer  Some patients experience stomach irritation or in increased heartburn with anti-inflammatory drugs.  Please keep in mind that muscle relaxer's can cause fatigue and should not be taken while at work or driving.  Back pain is very common.  The pain often gets better over time.  The cause of back pain is usually not dangerous.  Most people can learn to manage their back pain on their own.  Home Care  Stay active.  Start with short walks on flat ground if you can.  Try to walk farther each day.  Do not sit, drive or stand in one place for more than 30 minutes.  Do not stay in bed.  Do not avoid exercise or work.  Activity can help your back heal faster.  Be careful when you bend or lift an object.  Bend at your knees, keep the object close to you, and do not twist.  Sleep on a firm mattress.  Lie on your side, and bend your knees.  If you lie on your back, put a  pillow under your knees.  Only take medicines as told by your doctor.  Put ice on the injured area.  Put ice in a plastic bag  Place a towel between your skin and the bag  Leave the ice on for 15-20 minutes, 3-4 times a day for the first 2-3 days. 210 After that, you can switch between ice and heat packs.  Ask your doctor about back exercises or massage.  Avoid feeling anxious or stressed.  Find good ways to deal with stress, such as exercise.  Get Help Right Way If:  Your pain does not go away with rest or medicine.  Your pain does not go away in 1 week.  You have new problems.  You do not feel well.  The pain spreads into your legs.  You cannot control when you poop (bowel movement) or pee (urinate)  You feel sick to your stomach (nauseous) or throw up (vomit)  You have belly (abdominal) pain.  You feel like you may pass out (faint).  If you develop a fever.  Make Sure you:  Understand these instructions.  Will watch your condition  Will get help right away if you are not doing well or get worse.  Your e-visit answers were reviewed by a board certified advanced clinical  practitioner to complete your personal care plan.  Depending on the condition, your plan could have included both over the counter or prescription medications.  If there is a problem please reply  once you have received a response from your provider.  Your safety is important to [place].  If you have drug allergies check your prescription carefully.    You can use MyChart to ask questions about today's visit, request a non-urgent call back, or ask for a work or school excuse for 24 hours related to this e-Visit. If it has been greater than 24 hours you will need to follow up with your provider, or enter a new e-Visit to address those concerns.  You will get an e-mail in the next two days asking about your experience.  I hope that your e-visit has been valuable and will speed your recovery. Thank you  for using e-visits.

## 2018-04-21 ENCOUNTER — Ambulatory Visit (INDEPENDENT_AMBULATORY_CARE_PROVIDER_SITE_OTHER): Payer: 59 | Admitting: Psychiatry

## 2018-04-21 ENCOUNTER — Encounter: Payer: Self-pay | Admitting: Psychiatry

## 2018-04-21 ENCOUNTER — Other Ambulatory Visit: Payer: Self-pay

## 2018-04-21 VITALS — BP 123/85 | HR 93 | Temp 97.9°F | Wt 223.4 lb

## 2018-04-21 DIAGNOSIS — F410 Panic disorder [episodic paroxysmal anxiety] without agoraphobia: Secondary | ICD-10-CM | POA: Diagnosis not present

## 2018-04-21 DIAGNOSIS — F3181 Bipolar II disorder: Secondary | ICD-10-CM | POA: Diagnosis not present

## 2018-04-21 DIAGNOSIS — F17200 Nicotine dependence, unspecified, uncomplicated: Secondary | ICD-10-CM | POA: Diagnosis not present

## 2018-04-21 DIAGNOSIS — F4310 Post-traumatic stress disorder, unspecified: Secondary | ICD-10-CM

## 2018-04-21 MED ORDER — LAMOTRIGINE 25 MG PO TABS: 25.0000 mg | ORAL_TABLET | Freq: Every day | ORAL | 1 refills | Status: DC

## 2018-04-21 MED ORDER — SERTRALINE HCL 50 MG PO TABS: 75.0000 mg | ORAL_TABLET | Freq: Every day | ORAL | 1 refills | Status: DC

## 2018-04-21 MED ORDER — HYDROXYZINE PAMOATE 25 MG PO CAPS: 25.0000 mg | ORAL_CAPSULE | Freq: Three times a day (TID) | ORAL | 1 refills | Status: DC | PRN

## 2018-04-21 NOTE — Progress Notes (Signed)
[place] MD OP Progress Note  04/21/2018 5:19 PM HEBA IGE  MRN:  [id number]  Chief Complaint: ' I am here for follow up.' Chief Complaint    Follow-up; Medication Refill     HPI: Essense is a 32 yr old African-American female, married, employed, lives in [place], has a history of bipolar disorder type II, PTSD, presented to the clinic today for a follow-up visit.  Patient was last seen in clinic on 12/23/2017.  Patient at that time was advised to participate in the [facility] .  She was also started on Zyprexa and lithium at that time since she was struggling with mood symptoms.  Patient today returns for a follow-up visit.  She reports she has stopped taking her Zyprexa and lithium and also did not go to the [facility]  program.  She reports she stopped the medications since she was concerned about weight changes.  She reports she did not want to go to IOP program since she wanted to make some lifestyle changes first.  She reports she started going to church more.  She reports she also changed her outlook in general.  She reports her major stressor right now is that her husband and her continues to have relationship struggles.  He had started a job but quit that job soon after.  She reports she feels she is taking care of 4 children instead of 3 since her husband continues to behave like one.   Reports she continues to struggle with some mood lability and anxiety symptoms.  She ran out of Klonopin a month ago and has not taken it after that.  She reports she is interested in medication readjustment as well as a mood stabilizer.  Discussed Lamictal which is weight neutral since patient is concerned about weight gain.  She agrees with plan.  Also discussed readjusting her Zoloft.  Discussed with patient that she can be referred for psychotherapy in this clinic.  Patient agreed with plan.  Patient denies any suicidality, perceptual disturbances. Visit Diagnosis:    ICD-10-CM   1. Bipolar 2  disorder, major depressive episode (H[facility]) F31.81 lamoTRIgine (LAMICTAL) 25 MG tablet  2. Post-traumatic stress disorder F43.10 sertraline (ZOLOFT) 50 MG tablet  3. Panic attacks F41.0 sertraline (ZOLOFT) 50 MG tablet    hydrOXYzine (VISTARIL) 25 MG capsule  4. Tobacco use disorder F17.200     Past Psychiatric History: Reviewed past psychiatric history from my progress note on 11/09/2017.  Past trials of medications like Zoloft, Prozac, Celexa, Lexapro, Abilify, Latuda, Wellbutrin, Paxil, Depakote, Seroquel, trazodone, carbamazepine,trintellix  Past Medical History:  Past Medical History:  Diagnosis Date  . Anxiety   . Bipolar disorder ([place])    past hx of  . Complication of anesthesia    "woke up during surgery"  . Depression   . Heavy periods   . Incontinence of urine   . Increased BMI   . Indigestion   . PTSD (post-traumatic stress disorder)   . Tobacco user     Past Surgical History:  Procedure Laterality Date  . CESAREAN SECTION  2010  . LAPAROSCOPIC VAGINAL HYSTERECTOMY WITH SALPINGECTOMY Bilateral 12/16/2015   Procedure: LAPAROSCOPIC ASSISTED VAGINAL HYSTERECTOMY WITH BILATERAL SALPINGECTOMY;  Surgeon: Brayton Mars, MD;  Location: [facility] ORS;  Service: Gynecology;  Laterality: Bilateral;  . TUBAL LIGATION  2011    Family Psychiatric History: Reviewed family psychiatric history from my progress note on 11/09/2017  Family History:  Family History  Problem Relation Age of Onset  .  Mental illness Mother   . Diabetes Maternal Grandmother   . Heart disease Maternal Grandfather   . Cancer Neg Hx     Social History: Reviewed social history from my progress note on 11/09/2017 Social History   Socioeconomic History  . Marital status: Married    Spouse name: Not on file  . Number of children: Not on file  . Years of education: Not on file  . Highest education level: Not on file  Occupational History  . Not on file  Social Needs  . Financial resource strain: Not on  file  . Food insecurity:    Worry: Not on file    Inability: Not on file  . Transportation needs:    Medical: Not on file    Non-medical: Not on file  Tobacco Use  . Smoking status: Current Every Day Smoker    Packs/day: 0.50    Types: Cigarettes  . Smokeless tobacco: Never Used  Substance and Sexual Activity  . Alcohol use: Yes    Alcohol/week: 0.0 - 6.0 standard drinks    Comment: occasionally; beer  . Drug use: No  . Sexual activity: Yes    Birth control/protection: Surgical  Lifestyle  . Physical activity:    Days per week: Not on file    Minutes per session: Not on file  . Stress: Not on file  Relationships  . Social connections:    Talks on phone: Not on file    Gets together: Not on file    Attends religious service: Not on file    Active member of club or organization: Not on file    Attends meetings of clubs or organizations: Not on file    Relationship status: Not on file  Other Topics Concern  . Not on file  Social History Narrative  . Not on file    Allergies:  Allergies  Allergen Reactions  . Amoxicillin Rash and Other (See Comments)    GI upset    Metabolic Disorder Labs: Lab Results  Component Value Date   HGBA1C 5.4 11/05/2016   No results found for: PROLACTIN Lab Results  Component Value Date   CHOL 159 11/05/2016   TRIG 32 11/05/2016   HDL 53 11/05/2016   CHOLHDL 3.0 11/05/2016   LDLCALC 100 (H) 11/05/2016   Lab Results  Component Value Date   TSH 0.828 12/21/2017   TSH 0.998 03/02/2017    Therapeutic Level Labs: Lab Results  Component Value Date   LITHIUM 0.5 (L) 12/29/2017   LITHIUM 0.3 (L) 12/21/2017   No results found for: VALPROATE No components found for:  CBMZ  Current Medications: Current Outpatient Medications  Medication Sig Dispense Refill  . baclofen (LIORESAL) 10 MG tablet Take 1 tablet (10 mg total) by mouth every 8 (eight) hours as needed for muscle spasms. 30 each 0  . cyanocobalamin (,VITAMIN B-12,) 1000  MCG/ML injection Inject 1 mL (1,000 mcg total) into the muscle every 30 (thirty) days. 10 mL 1  . cyclobenzaprine (FLEXERIL) 10 MG tablet Take 1 tablet (10 mg total) by mouth 3 (three) times daily as needed for muscle spasms. No driving while taking this medication 30 tablet 0  . sertraline (ZOLOFT) 50 MG tablet Take 1.5 tablets (75 mg total) by mouth daily. 45 tablet 1  . hydrOXYzine (VISTARIL) 25 MG capsule Take 1 capsule (25 mg total) by mouth 3 (three) times daily as needed for anxiety. 90 capsule 1  . lamoTRIgine (LAMICTAL) 25 MG tablet Take 1-2 tablets (  25-50 mg total) by mouth daily. Take 25 mg for 2 weeks and then increase to 50 mg 60 tablet 1   No current facility-administered medications for this visit.      Musculoskeletal: Strength & Muscle Tone: within normal limits Gait & Station: normal Patient leans: N/A  Psychiatric Specialty Exam: Review of Systems  Psychiatric/Behavioral: Positive for depression. The patient is nervous/anxious.   All other systems reviewed and are negative.   Blood pressure 123/85, pulse 93, temperature 97.9 F (36.6 C), temperature source Oral, weight 223 lb 6.4 oz (101.3 kg), last menstrual period 11/22/2015.Body mass index is 40.86 kg/m.  General Appearance: Casual  Eye Contact:  Fair  Speech:  Clear and Coherent  Volume:  Normal  Mood:  Anxious and Depressed  Affect:  Depressed  Thought Process:  Goal Directed and Descriptions of Associations: Intact  Orientation:  Full (Time, Place, and Person)  Thought Content: Logical   Suicidal Thoughts:  No  Homicidal Thoughts:  No  Memory:  Immediate;   Fair Recent;   Fair Remote;   Fair  Judgement:  Fair  Insight:  Fair  Psychomotor Activity:  Normal  Concentration:  Concentration: Good and Attention Span: Fair  Recall:  [employer] of Knowledge: Fair  Language: Fair  Akathisia:  No  Handed:  Right  AIMS (if indicated): na  Assets:  Communication Skills Desire for Improvement Social  Support  ADL's:  Intact  Cognition: WNL  Sleep:  Fair   Screenings: GAD-7     Office Visit from 03/31/2017 in [place] Office Visit from 03/02/2017 in [place] Visit from 10/14/2016 in [place] Visit from 07/22/2016 in [place]  Total GAD-7 Score  15  17  17  12     PHQ2-9     Office Visit from 07/15/2017 in [place] Visit from 03/31/2017 in [place] Visit from 03/02/2017 in [place] Visit from 10/14/2016 in [place] from 07/29/2016 in [place]  PHQ-2 Total Score  2  0  2  5  1   PHQ-9 Total Score  13  -  -  21  -       Assessment and Plan: Lucella is a 32 year old African-American female who has a history of bipolar disorder type II, PTSD, sleep problems, panic attacks, presented to the clinic today for a follow-up visit.  Patient reports she continues to struggle with mood lability.  She continues to have psychosocial stressors of relationship struggles with her husband.  Patient did not follow recommendations for psychotherapy referral or PHP program referral.  Patient reports that she is willing to start psychotherapy sessions in this clinic.  Plan as noted below.  Plan Bipolar disorder type II Discontinue Zyprexa for noncompliance. Discontinue lithium for noncompliance. Increase Zoloft to 75 mg p.o. daily. Start Lamictal 25 mg for 2 weeks and increase to 50 mg after that.    Panic attacks Continue hydroxyzine 25 mg p.o. 3 times daily as needed  For PTSD Increase Zoloft to 75 mg p.o. daily   For insomnia Pt reports insomnia has resolved ,we will continue to monitor closely.  Refer for CBT with therapist here in clinic.  Follow-up in clinic in 3 weeks or sooner if needed.  More than 50 % of the time was spent for psychoeducation and supportive psychotherapy and care coordination.  This  note was generated in part or whole with voice recognition  software. Voice recognition is usually quite accurate but there are transcription errors that can and very often do occur. I apologize for any typographical errors that were not detected and corrected.         Ursula Alert, MD 04/21/2018, 5:19 PM

## 2018-04-21 NOTE — Patient Instructions (Signed)
Lamotrigine tablets What is this medicine? LAMOTRIGINE (la MOE tri jeen) is used to control seizures in adults and children with epilepsy and Lennox-Gastaut syndrome. It is also used in adults to treat bipolar disorder. This medicine may be used for other purposes; ask your health care provider or pharmacist if you have questions. COMMON BRAND NAME(S): Lamictal What should I tell my health care provider before I take this medicine? They need to know if you have any of these conditions: -a history of depression or bipolar disorder -aseptic meningitis during prior use of lamotrigine -folate deficiency -kidney disease -liver disease -suicidal thoughts, plans, or attempt; a previous suicide attempt by you or a family member -an unusual or allergic reaction to lamotrigine or other seizure medications, other medicines, foods, dyes, or preservatives -pregnant or trying to get pregnant -breast-feeding How should I use this medicine? Take this medicine by mouth with a glass of water. Follow the directions on the prescription label. Do not chew these tablets. If this medicine upsets your stomach, take it with food or milk. Take your doses at regular intervals. Do not take your medicine more often than directed. A special MedGuide will be given to you by the pharmacist with each new prescription and refill. Be sure to read this information carefully each time. Talk to your pediatrician regarding the use of this medicine in children. While this drug may be prescribed for children as young as 2 years for selected conditions, precautions do apply. Overdosage: If you think you have taken too much of this medicine contact a poison control center or emergency room at once. NOTE: This medicine is only for you. Do not share this medicine with others. What if I miss a dose? If you miss a dose, take it as soon as you can. If it is almost time for your next dose, take only that dose. Do not take double or extra  doses. What may interact with this medicine? -carbamazepine -female hormones, including contraceptive or birth control pills -methotrexate -phenobarbital -phenytoin -primidone -pyrimethamine -rifampin -trimethoprim -valproic acid This list may not describe all possible interactions. Give your health care provider a list of all the medicines, herbs, non-prescription drugs, or dietary supplements you use. Also tell them if you smoke, drink alcohol, or use illegal drugs. Some items may interact with your medicine. What should I watch for while using this medicine? Visit your doctor or health care professional for regular checks on your progress. If you take this medicine for seizures, wear a Medic Alert bracelet or necklace. Carry an identification card with information about your condition, medicines, and doctor or health care professional. It is important to take this medicine exactly as directed. When first starting treatment, your dose will need to be adjusted slowly. It may take weeks or months before your dose is stable. You should contact your doctor or health care professional if your seizures get worse or if you have any new types of seizures. Do not stop taking this medicine unless instructed by your doctor or health care professional. Stopping your medicine suddenly can increase your seizures or their severity. Contact your doctor or health care professional right away if you develop a rash while taking this medicine. Rashes may be very severe and sometimes require treatment in the hospital. Deaths from rashes have occurred. Serious rashes occur more often in children than adults taking this medicine. It is more common for these serious rashes to occur during the first 2 months of treatment, but a rash can   occur at any time. You may get drowsy, dizzy, or have blurred vision. Do not drive, use machinery, or do anything that needs mental alertness until you know how this medicine affects you.  To reduce dizzy or fainting spells, do not sit or stand up quickly, especially if you are an older patient. Alcohol can increase drowsiness and dizziness. Avoid alcoholic drinks. If you are taking this medicine for bipolar disorder, it is important to report any changes in your mood to your doctor or health care professional. If your condition gets worse, you get mentally depressed, feel very hyperactive or manic, have difficulty sleeping, or have thoughts of hurting yourself or committing suicide, you need to get help from your health care professional right away. If you are a caregiver for someone taking this medicine for bipolar disorder, you should also report these behavioral changes right away. The use of this medicine may increase the chance of suicidal thoughts or actions. Pay special attention to how you are responding while on this medicine. Your mouth may get dry. Chewing sugarless gum or sucking hard candy, and drinking plenty of water may help. Contact your doctor if the problem does not go away or is severe. Women who become pregnant while using this medicine may enroll in the [place] Antiepileptic Drug Pregnancy Registry by calling 1-888-233-2334. This registry collects information about the safety of antiepileptic drug use during pregnancy. What side effects may I notice from receiving this medicine? Side effects that you should report to your doctor or health care professional as soon as possible: -allergic reactions like skin rash, itching or hives, swelling of the face, lips, or tongue -blurred or double vision -difficulty walking or controlling muscle movements -fever -headache, stiff neck, and sensitivity to light -painful sores in the mouth, eyes, or nose -redness, blistering, peeling or loosening of the skin, including inside the mouth -severe muscle pain -swollen lymph glands -uncontrollable eye movements -unusual bruising or bleeding -unusually weak or  tired -vomiting -worsening of mood, thoughts or actions of suicide or dying -yellowing of the eyes or skin Side effects that usually do not require medical attention (report to your doctor or health care professional if they continue or are bothersome): -diarrhea or constipation -difficulty sleeping -nausea -tremors This list may not describe all possible side effects. Call your doctor for medical advice about side effects. You may report side effects to FDA at 1-800-FDA-1088. Where should I keep my medicine? Keep out of reach of children. Store at room temperature between 15 and 30 degrees C (59 and 86 degrees F). Throw away any unused medicine after the expiration date. NOTE: This sheet is a summary. It may not cover all possible information. If you have questions about this medicine, talk to your doctor, pharmacist, or health care provider.  2018 Elsevier/Gold Standard (2015-07-18 09:29:40)  

## 2018-05-03 ENCOUNTER — Ambulatory Visit: Payer: 59 | Admitting: Licensed Clinical Social Worker

## 2018-05-19 ENCOUNTER — Ambulatory Visit: Payer: 59 | Admitting: Psychiatry

## 2018-06-13 ENCOUNTER — Telehealth: Payer: 59 | Admitting: Family

## 2018-06-13 ENCOUNTER — Encounter: Payer: Self-pay | Admitting: Family

## 2018-06-13 DIAGNOSIS — G8929 Other chronic pain: Secondary | ICD-10-CM

## 2018-06-13 DIAGNOSIS — M5442 Lumbago with sciatica, left side: Secondary | ICD-10-CM | POA: Diagnosis not present

## 2018-06-13 MED ORDER — PREDNISONE 5 MG PO TABS: 5.0000 mg | ORAL_TABLET | ORAL | 0 refills | Status: DC

## 2018-06-13 MED ORDER — BACLOFEN 10 MG PO TABS: 10.0000 mg | ORAL_TABLET | Freq: Three times a day (TID) | ORAL | 0 refills | Status: DC | PRN

## 2018-06-13 NOTE — Progress Notes (Signed)
Thank you for the details you included in the comment boxes. Those details are very helpful in determining the best course of treatment for you and help [place] to provide the best care.  We are sorry that you are not feeling well.  Here is how we plan to help!  Based on what you have shared with me it looks like you mostly have acute back pain.  Acute back pain is defined as musculoskeletal pain that can resolve in 1-3 weeks with conservative treatment.  I have prescribed (over the counter Advil in moderation only, as it interacts with Zoloft; we cannot do a stronger NSAID for GI bleeding risk), non-steroid anti-inflammatory (NSAID) as well as Baclofen 10 mg every eight hours as needed which is a muscle relaxer     Some patients experience stomach irritation or in increased heartburn with anti-inflammatory drugs.  Please keep in mind that muscle relaxer's can cause fatigue and should not be taken while at work or driving.  Back pain is very common.  The pain often gets better over time.  The cause of back pain is usually not dangerous.  Most people can learn to manage their back pain on their own.  As you cannot take NSAIDS in higher amounts, I sent a low-dose prednisone pack for your pain. You will taper tabs per day 6,5,4,3,2,1 for Days 1,2,3,4,5,6. Pharmacy will put this on the bottle.   Home Care  Stay active.  Start with short walks on flat ground if you can.  Try to walk farther each day.  Do not sit, drive or stand in one place for more than 30 minutes.  Do not stay in bed.  Do not avoid exercise or work.  Activity can help your back heal faster.  Be careful when you bend or lift an object.  Bend at your knees, keep the object close to you, and do not twist.  Sleep on a firm mattress.  Lie on your side, and bend your knees.  If you lie on your back, put a pillow under your knees.  Only take medicines as told by your doctor.  Put ice on the injured area.  Put ice in a plastic  bag  Place a towel between your skin and the bag  Leave the ice on for 15-20 minutes, 3-4 times a day for the first 2-3 days. 210 After that, you can switch between ice and heat packs.  Ask your doctor about back exercises or massage.  Avoid feeling anxious or stressed.  Find good ways to deal with stress, such as exercise.  Get Help Right Way If:  Your pain does not go away with rest or medicine.  Your pain does not go away in 1 week.  You have new problems.  You do not feel well.  The pain spreads into your legs.  You cannot control when you poop (bowel movement) or pee (urinate)  You feel sick to your stomach (nauseous) or throw up (vomit)  You have belly (abdominal) pain.  You feel like you may pass out (faint).  If you develop a fever.  Make Sure you:  Understand these instructions.  Will watch your condition  Will get help right away if you are not doing well or get worse.  Your e-visit answers were reviewed by a board certified advanced clinical practitioner to complete your personal care plan.  Depending on the condition, your plan could have included both over the counter or prescription medications.  If there  is a problem please reply  once you have received a response from your provider.  Your safety is important to [place].  If you have drug allergies check your prescription carefully.    You can use MyChart to ask questions about today's visit, request a non-urgent call back, or ask for a work or school excuse for 24 hours related to this e-Visit. If it has been greater than 24 hours you will need to follow up with your provider, or enter a new e-Visit to address those concerns.  You will get an e-mail in the next two days asking about your experience.  I hope that your e-visit has been valuable and will speed your recovery. Thank you for using e-visits.

## 2018-07-01 ENCOUNTER — Other Ambulatory Visit: Payer: Self-pay | Admitting: Psychiatry

## 2018-07-01 DIAGNOSIS — F410 Panic disorder [episodic paroxysmal anxiety] without agoraphobia: Secondary | ICD-10-CM

## 2018-07-01 DIAGNOSIS — F3181 Bipolar II disorder: Secondary | ICD-10-CM

## 2018-07-01 DIAGNOSIS — F4310 Post-traumatic stress disorder, unspecified: Secondary | ICD-10-CM

## 2018-07-18 ENCOUNTER — Encounter: Payer: Self-pay | Admitting: Family Medicine

## 2018-07-18 ENCOUNTER — Ambulatory Visit (INDEPENDENT_AMBULATORY_CARE_PROVIDER_SITE_OTHER): Payer: 59 | Admitting: Family Medicine

## 2018-07-18 VITALS — BP 117/87 | HR 95 | Temp 98.7°F | Ht 63.5 in | Wt 219.4 lb

## 2018-07-18 DIAGNOSIS — Z0000 Encounter for general adult medical examination without abnormal findings: Secondary | ICD-10-CM | POA: Diagnosis not present

## 2018-07-18 DIAGNOSIS — F419 Anxiety disorder, unspecified: Secondary | ICD-10-CM | POA: Diagnosis not present

## 2018-07-18 DIAGNOSIS — L918 Other hypertrophic disorders of the skin: Secondary | ICD-10-CM

## 2018-07-18 DIAGNOSIS — Z113 Encounter for screening for infections with a predominantly sexual mode of transmission: Secondary | ICD-10-CM

## 2018-07-18 DIAGNOSIS — F17210 Nicotine dependence, cigarettes, uncomplicated: Secondary | ICD-10-CM

## 2018-07-18 LAB — UA/M W/RFLX CULTURE, ROUTINE
Bilirubin, UA: NEGATIVE
Glucose, UA: NEGATIVE
Ketones, UA: NEGATIVE
Leukocytes, UA: NEGATIVE
NITRITE UA: NEGATIVE
Protein, UA: NEGATIVE
RBC, UA: NEGATIVE
Specific Gravity, UA: 1.025 (ref 1.005–1.030)
Urobilinogen, Ur: 0.2 mg/dL (ref 0.2–1.0)
pH, UA: 7 (ref 5.0–7.5)

## 2018-07-18 MED ORDER — BUPROPION HCL ER (XL) 150 MG PO TB24: 150.0000 mg | ORAL_TABLET | Freq: Every day | ORAL | 0 refills | Status: DC

## 2018-07-18 NOTE — Progress Notes (Signed)
BP 117/87 (BP Location: Left Arm, Patient Position: Sitting, Cuff Size: Large)   Pulse 95   Temp 98.7 F (37.1 C)   Ht 5' 3.5" (1.613 m)   Wt 219 lb 7 oz (99.5 kg)   LMP 11/22/2015 (Approximate)   SpO2 100%   BMI 38.26 kg/m    Subjective:    Patient ID: Laura [name], female    DOB: February 04, 1986, 33 y.o.   MRN: [id number]  HPI: Laura [name] is a 33 y.o. female presenting on 07/18/2018 for comprehensive medical examination. Current medical complaints include:see below  Wanting to quit smoking, promised her children she would quit this year. Was successful in the past with wellbutrin. Smoking 7-8 cigarettes daily now. Getting patches from pharmacy through a program for free.   Has an irritated skin tag under left arm that keeps catching on clothing and bleeding. Would like to have it removed. Has had it most of her life and it has not changed much other than bleeding some when it gets caught up on things.   Stopped seeing Psychiatry, states she feels like she's in a better place with her moods and no longer needs it. Taking hydroxyzine prn which is going well. Not going to counseling. Denies SI/HI.   She currently lives with: Menopausal Symptoms: no  Depression Screen done today and results listed below:  Depression screen [facility] 2/9 07/15/2017 03/31/2017 03/03/2017 10/16/2016 07/29/2016  Decreased Interest 1 0 1 2 0  Down, Depressed, Hopeless 1 0 1 3 1   PHQ - 2 Score 2 0 2 5 1   Altered sleeping 3 - - 3 -  Tired, decreased energy 3 - - 3 -  Change in appetite 3 - - 3 -  Feeling bad or failure about yourself  1 - - 3 -  Trouble concentrating 0 - - 2 -  Moving slowly or fidgety/restless 1 - - 1 -  Suicidal thoughts 0 - - 1 -  PHQ-9 Score 13 - - 21 -    The patient does not have a history of falls. I did not complete a risk assessment for falls. A plan of care for falls was not documented.   Past Medical History:  Past Medical History:  Diagnosis Date  . Anxiety   .  Bipolar disorder ([place])    past hx of  . Complication of anesthesia    "woke up during surgery"  . Depression   . Heavy periods   . Incontinence of urine   . Increased BMI   . Indigestion   . PTSD (post-traumatic stress disorder)   . Tobacco user     Surgical History:  Past Surgical History:  Procedure Laterality Date  . ABDOMINAL HYSTERECTOMY  2017  . CESAREAN SECTION  2010  . LAPAROSCOPIC VAGINAL HYSTERECTOMY WITH SALPINGECTOMY Bilateral 12/16/2015   Procedure: LAPAROSCOPIC ASSISTED VAGINAL HYSTERECTOMY WITH BILATERAL SALPINGECTOMY;  Surgeon: Brayton Mars, MD;  Location: [facility] ORS;  Service: Gynecology;  Laterality: Bilateral;  . TUBAL LIGATION  2011    Medications:  Current Outpatient Medications on File Prior to Visit  Medication Sig  . Biotin 2500 MCG CAPS   . hydrOXYzine (VISTARIL) 25 MG capsule Take 1 capsule (25 mg total) by mouth 3 (three) times daily as needed for anxiety.   No current facility-administered medications on file prior to visit.     Allergies:  Allergies  Allergen Reactions  . Amoxicillin Rash and Other (See Comments)    GI upset    Social  History:  Social History   Socioeconomic History  . Marital status: Married    Spouse name: Not on file  . Number of children: Not on file  . Years of education: Not on file  . Highest education level: Not on file  Occupational History  . Not on file  Social Needs  . Financial resource strain: Not on file  . Food insecurity:    Worry: Not on file    Inability: Not on file  . Transportation needs:    Medical: Not on file    Non-medical: Not on file  Tobacco Use  . Smoking status: Current Every Day Smoker    Packs/day: 0.25    Years: 11.00    Pack years: 2.75    Types: Cigarettes  . Smokeless tobacco: Never Used  Substance and Sexual Activity  . Alcohol use: Yes    Alcohol/week: 0.0 - 6.0 standard drinks    Comment: occasionally; beer  . Drug use: No  . Sexual activity: Yes    Birth  control/protection: Surgical  Lifestyle  . Physical activity:    Days per week: Not on file    Minutes per session: Not on file  . Stress: Not on file  Relationships  . Social connections:    Talks on phone: Not on file    Gets together: Not on file    Attends religious service: Not on file    Active member of club or organization: Not on file    Attends meetings of clubs or organizations: Not on file    Relationship status: Not on file  . Intimate partner violence:    Fear of current or ex partner: Not on file    Emotionally abused: Not on file    Physically abused: Not on file    Forced sexual activity: Not on file  Other Topics Concern  . Not on file  Social History Narrative  . Not on file   Social History   Tobacco Use  Smoking Status Current Every Day Smoker  . Packs/day: 0.25  . Years: 11.00  . Pack years: 2.75  . Types: Cigarettes  Smokeless Tobacco Never Used   Social History   Substance and Sexual Activity  Alcohol Use Yes  . Alcohol/week: 0.0 - 6.0 standard drinks   Comment: occasionally; beer    Family History:  Family History  Problem Relation Age of Onset  . Mental illness Mother   . Diabetes Maternal Grandmother   . Heart disease Maternal Grandfather   . Cancer Neg Hx     Past medical history, surgical history, medications, allergies, family history and social history reviewed with patient today and changes made to appropriate areas of the chart.   Review of Systems - General ROS: negative Psychological ROS: negative Ophthalmic ROS: negative ENT ROS: negative Allergy and Immunology ROS: negative Hematological and Lymphatic ROS: negative Endocrine ROS: negative Breast ROS: negative for breast lumps Respiratory ROS: no cough, shortness of breath, or wheezing Cardiovascular ROS: no chest pain or dyspnea on exertion Gastrointestinal ROS: no abdominal pain, change in bowel habits, or black or bloody stools Genito-Urinary ROS: no dysuria,  trouble voiding, or hematuria Musculoskeletal ROS: negative Neurological ROS: no TIA or stroke symptoms Dermatological ROS: negative All other ROS negative except what is listed above and in the HPI.      Objective:    BP 117/87 (BP Location: Left Arm, Patient Position: Sitting, Cuff Size: Large)   Pulse 95   Temp 98.7 F (37.1  C)   Ht 5' 3.5" (1.613 m)   Wt 219 lb 7 oz (99.5 kg)   LMP 11/22/2015 (Approximate)   SpO2 100%   BMI 38.26 kg/m   Wt Readings from Last 3 Encounters:  07/18/18 219 lb 7 oz (99.5 kg)  04/21/18 223 lb 6.4 oz (101.3 kg)  12/23/17 217 lb 3.2 oz (98.5 kg)    Physical Exam Vitals signs and nursing note reviewed.  Constitutional:      General: She is not in acute distress.    Appearance: She is well-developed.  HENT:     Head: Atraumatic.     Right Ear: External ear normal.     Left Ear: External ear normal.     Nose: Nose normal.     Mouth/Throat:     Pharynx: No oropharyngeal exudate.  Eyes:     General: No scleral icterus.    Conjunctiva/sclera: Conjunctivae normal.     Pupils: Pupils are equal, round, and reactive to light.  Neck:     Musculoskeletal: Normal range of motion and neck supple.     Thyroid: No thyromegaly.  Cardiovascular:     Rate and Rhythm: Normal rate and regular rhythm.     Heart sounds: Normal heart sounds.  Pulmonary:     Effort: Pulmonary effort is normal. No respiratory distress.     Breath sounds: Normal breath sounds.  Chest:     Breasts:        Right: No mass, nipple discharge, skin change or tenderness.        Left: No mass, nipple discharge, skin change or tenderness.  Abdominal:     General: Bowel sounds are normal.     Palpations: Abdomen is soft. There is no mass.     Tenderness: There is no abdominal tenderness.  Musculoskeletal: Normal range of motion.        General: No tenderness.  Lymphadenopathy:     Cervical: No cervical adenopathy.     Upper Body:     Right upper body: No axillary adenopathy.      Left upper body: No axillary adenopathy.  Skin:    General: Skin is warm and dry.     Findings: No rash.  Neurological:     Mental Status: She is alert and oriented to person, place, and time.     Cranial Nerves: No cranial nerve deficit.  Psychiatric:        Behavior: Behavior normal.        Thought Content: Thought content normal.        Judgment: Judgment normal.     Procedure: shave excision irritated skin tag left underarm Procedure explained in detail, questions answered adequately. Area was prepped with alcohol pad and infiltrated with 2 cc 1% lidocaine with epi. It was then prepped with betadine and a dermablade was used to fully excise skin tag at the base. Silver nitrate stick used for cautery with cessation of small amount of bleeding. Area was dressed with antibiotic ointment and bandaged. Wound care and return precautions reviewed.   Results for orders placed or performed in visit on 12/29/17  Lithium level  Result Value Ref Range   Lithium Lvl 0.5 (L) 0.6 - 1.2 mmol/L      Assessment & Plan:   Problem List Items Addressed This Visit      Other   Anxiety    Stable on prn hydroxyzine. No longer following with Psychiatry, states she's doing much better and stopped everything else she was  taking.       Relevant Medications   buPROPion (WELLBUTRIN XL) 150 MG 24 hr tablet    Other Visit Diagnoses    Cigarette smoker    -  Primary   Motivated to quit. Will restart wellbutrin to help, continue patches as needed. Aware of free courses at [facility] for cessation counseling   Annual physical exam       Relevant Orders   CBC with Differential/Platelet   Comprehensive metabolic panel   Lipid Panel w/o Chol/HDL Ratio   TSH   UA/M w/rflx Culture, Routine   Inflamed skin tag       Shave excision performed today without complication. Home care and return precautions reviewed   Routine screening for STI (sexually transmitted infection)       Relevant Orders   HIV Antibody  (routine testing w rflx)   RPR   HSV(herpes simplex vrs) 1+2 ab-IgG   GC/Chlamydia Probe Amp       Follow up plan: Return in about 4 weeks (around 08/15/2018) for Smoking cessation f/u.   LABORATORY TESTING:  - Pap smear: not applicable  IMMUNIZATIONS:   - Tdap: Tetanus vaccination status reviewed: last tetanus booster within 10 years. - Influenza: Up to date  PATIENT COUNSELING:   Advised to take 1 mg of folate supplement per day if capable of pregnancy.   Sexuality: Discussed sexually transmitted diseases, partner selection, use of condoms, avoidance of unintended pregnancy  and contraceptive alternatives.   Advised to avoid cigarette smoking.  I discussed with the patient that most people either abstain from alcohol or drink within safe limits (<=14/week and <=4 drinks/occasion for males, <=7/weeks and <= 3 drinks/occasion for females) and that the risk for alcohol disorders and other health effects rises proportionally with the number of drinks per week and how often a drinker exceeds daily limits.  Discussed cessation/primary prevention of drug use and availability of treatment for abuse.   Diet: Encouraged to adjust caloric intake to maintain  or achieve ideal body weight, to reduce intake of dietary saturated fat and total fat, to limit sodium intake by avoiding high sodium foods and not adding table salt, and to maintain adequate dietary potassium and calcium preferably from fresh fruits, vegetables, and low-fat dairy products.    stressed the importance of regular exercise  Injury prevention: Discussed safety belts, safety helmets, smoke detector, smoking near bedding or upholstery.   Dental health: Discussed importance of regular tooth brushing, flossing, and dental visits.    NEXT PREVENTATIVE PHYSICAL DUE IN 1 YEAR. Return in about 4 weeks (around 08/15/2018) for Smoking cessation f/u.

## 2018-07-18 NOTE — Assessment & Plan Note (Signed)
Stable on prn hydroxyzine. No longer following with Psychiatry, states she's doing much better and stopped everything else she was taking.

## 2018-07-19 LAB — CBC WITH DIFFERENTIAL/PLATELET
BASOS: 1 %
Basophils Absolute: 0 10*3/uL (ref 0.0–0.2)
EOS (ABSOLUTE): 0.1 10*3/uL (ref 0.0–0.4)
Eos: 1 %
Hematocrit: 41 % (ref 34.0–46.6)
Hemoglobin: 13.9 g/dL (ref 11.1–15.9)
Immature Grans (Abs): 0 10*3/uL (ref 0.0–0.1)
Immature Granulocytes: 0 %
LYMPHS ABS: 2.5 10*3/uL (ref 0.7–3.1)
Lymphs: 40 %
MCH: 29.6 pg (ref 26.6–33.0)
MCHC: 33.9 g/dL (ref 31.5–35.7)
MCV: 87 fL (ref 79–97)
Monocytes Absolute: 0.4 10*3/uL (ref 0.1–0.9)
Monocytes: 7 %
NEUTROS ABS: 3.2 10*3/uL (ref 1.4–7.0)
Neutrophils: 51 %
Platelets: 317 10*3/uL (ref 150–450)
RBC: 4.7 x10E6/uL (ref 3.77–5.28)
RDW: 13.4 % (ref 11.7–15.4)
WBC: 6.2 10*3/uL (ref 3.4–10.8)

## 2018-07-19 LAB — COMPREHENSIVE METABOLIC PANEL
ALT: 14 IU/L (ref 0–32)
AST: 13 IU/L (ref 0–40)
Albumin/Globulin Ratio: 2.1 (ref 1.2–2.2)
Albumin: 4.5 g/dL (ref 3.8–4.8)
Alkaline Phosphatase: 94 IU/L (ref 39–117)
BUN/Creatinine Ratio: 13 (ref 9–23)
BUN: 10 mg/dL (ref 6–20)
Bilirubin Total: 0.3 mg/dL (ref 0.0–1.2)
CALCIUM: 9.1 mg/dL (ref 8.7–10.2)
CO2: 21 mmol/L (ref 20–29)
CREATININE: 0.76 mg/dL (ref 0.57–1.00)
Chloride: 102 mmol/L (ref 96–106)
GFR calc Af Amer: 120 mL/min/{1.73_m2} (ref >59)
GFR calc non Af Amer: 104 mL/min/{1.73_m2} (ref >59)
Globulin, Total: 2.1 g/dL (ref 1.5–4.5)
Glucose: 87 mg/dL (ref 65–99)
Potassium: 3.9 mmol/L (ref 3.5–5.2)
Sodium: 141 mmol/L (ref 134–144)
Total Protein: 6.6 g/dL (ref 6.0–8.5)

## 2018-07-19 LAB — HSV(HERPES SIMPLEX VRS) I + II AB-IGG
HSV 1 Glycoprotein G Ab, IgG: <0.91 index (ref 0.00–0.90)
HSV 2 IgG, Type Spec: <0.91 index (ref 0.00–0.90)

## 2018-07-19 LAB — HIV ANTIBODY (ROUTINE TESTING W REFLEX): HIV Screen 4th Generation wRfx: NONREACTIVE

## 2018-07-19 LAB — LIPID PANEL W/O CHOL/HDL RATIO
Cholesterol, Total: 179 mg/dL (ref 100–199)
HDL: 65 mg/dL (ref >39)
LDL CALC: 104 mg/dL — AB (ref 0–99)
Triglycerides: 48 mg/dL (ref 0–149)
VLDL Cholesterol Cal: 10 mg/dL (ref 5–40)

## 2018-07-19 LAB — TSH: TSH: 1.59 u[IU]/mL (ref 0.450–4.500)

## 2018-07-19 LAB — RPR: RPR Ser Ql: NONREACTIVE

## 2018-07-21 ENCOUNTER — Ambulatory Visit (INDEPENDENT_AMBULATORY_CARE_PROVIDER_SITE_OTHER): Payer: 59 | Admitting: Obstetrics and Gynecology

## 2018-07-21 ENCOUNTER — Encounter: Payer: Self-pay | Admitting: Obstetrics and Gynecology

## 2018-07-21 VITALS — BP 111/82 | HR 87 | Ht 63.5 in | Wt 220.3 lb

## 2018-07-21 DIAGNOSIS — E669 Obesity, unspecified: Secondary | ICD-10-CM | POA: Diagnosis not present

## 2018-07-21 DIAGNOSIS — Z6838 Body mass index (BMI) 38.0-38.9, adult: Secondary | ICD-10-CM | POA: Diagnosis not present

## 2018-07-21 LAB — GC/CHLAMYDIA PROBE AMP
Chlamydia trachomatis, NAA: NEGATIVE
Neisseria gonorrhoeae by PCR: NEGATIVE

## 2018-07-21 MED ORDER — PHENTERMINE HCL 37.5 MG PO TABS: 37.5000 mg | ORAL_TABLET | Freq: Every day | ORAL | 2 refills | Status: DC

## 2018-07-21 MED ORDER — CYANOCOBALAMIN 1000 MCG/ML IJ SOLN: 1000.0000 ug | INTRAMUSCULAR | 1 refills | Status: DC

## 2018-07-21 NOTE — Progress Notes (Signed)
Subjective:  Laura [name] is a 33 y.o. G2P2003 at Unknown being seen today for weight loss management- initial visit.  Patient reports General ROS: negative and reports previous weight loss attempts:worked well with phentermine. Plans to sign up for weight watchers, doing zumba and plans to increase days of the week.      Pertinent medical history includes: chronic digestive disease, diabetes, eating disorder, anxiety and psychiatric illness.  Risk factors include: smoker- trying to quit, started wellbutrin last week.   The following portions of the patient's history were reviewed and updated as appropriate: allergies, current medications, past family history, past medical history, past social history, past surgical history and problem list.   Objective:   Vitals:   07/21/18 0833  BP: 111/82  Pulse: 87  Weight: 220 lb 4.8 oz (99.9 kg)  Height: 5' 3.5" (1.613 m)    General:  Alert, oriented and cooperative. Patient is in no acute distress.  :   :   :   :   :   :   PE: Well groomed female in no current distress,   Mental Status: Normal mood and affect. Normal behavior. Normal judgment and thought content.   Current BMI: Body mass index is 38.41 kg/m.   Assessment and Plan:  Obesity  There are no diagnoses linked to this encounter.  Plan: low carb, High protein diet RX for adipex 37.5 mg daily and B12 1074mcg.ml monthly, to start now with first injection given at today's visit. Reviewed side-effects common to both medications and expected outcomes. Increase daily water intake to at least 8 bottle a day, every day.  Goal is to reduse weight by 10% by end of three months, and will re-evaluate then.  RTC in 4 weeks for Nurse visit to check weight & BP, and get next B12 injections.    Please refer to After Visit Summary for other counseling recommendations.    [place], Laura [name], CNM   [place], CNM      Consider the Low Glycemic Index Diet and 6  smaller meals daily .  This boosts your metabolism and regulates your sugars:   Use the protein bar by [employer]kins because they have lots of fiber in them  Find the low carb flatbreads, tortillas and pita breads for sandwiches:  Joseph's makes a pita bread and a flat bread , available at [facility] and [employer]; [place] makes a low carb flatbread available at [employer] and HT that is 9 net carbs and 100 cal [place] makes a low carb whole wheat tortilla available at [employer] most grocery stores with 6 net carbs and 210 cal  [place] yogurt can still have a lot of carbs .  Dannon Light [name] fit has 80 cal and 8 carbs

## 2018-07-21 NOTE — Progress Notes (Signed)
Waist  41

## 2018-07-29 ENCOUNTER — Encounter: Payer: Self-pay | Admitting: Family Medicine

## 2018-07-30 ENCOUNTER — Telehealth: Payer: 59 | Admitting: Physician Assistant

## 2018-07-30 DIAGNOSIS — N898 Other specified noninflammatory disorders of vagina: Secondary | ICD-10-CM

## 2018-07-30 MED ORDER — FLUCONAZOLE 150 MG PO TABS: 150.0000 mg | ORAL_TABLET | Freq: Once | ORAL | 0 refills | Status: AC

## 2018-07-30 NOTE — Progress Notes (Signed)
We are sorry that you are not feeling well. Here is how we plan to help! Based on what you shared with me it looks like you: May have a yeast vaginosis   Vaginosis is an inflammation of the vagina that can result in discharge, itching and pain. The cause is usually a change in the normal balance of vaginal bacteria or an infection. Vaginosis can also result from reduced estrogen levels after menopause.  The most common causes of vaginosis are:   Bacterial vaginosis which results from an overgrowth of one on several organisms that are normally present in your vagina.   Yeast infections which are caused by a naturally occurring fungus called candida.   Vaginal atrophy (atrophic vaginosis) which results from the thinning of the vagina from reduced estrogen levels after menopause.   Trichomoniasis which is caused by a parasite and is commonly transmitted by sexual intercourse.  Factors that increase your risk of developing vaginosis include: Medications, such as antibiotics and steroids Uncontrolled diabetes Use of hygiene products such as bubble bath, vaginal spray or vaginal deodorant Douching Wearing damp or tight-fitting clothing Using an intrauterine device (IUD) for birth control Hormonal changes, such as those associated with pregnancy, birth control pills or menopause Sexual activity Having a sexually transmitted infection  Your treatment plan is A single Diflucan (fluconazole) 159m tablet once.  I have electronically sent this prescription into the pharmacy that you have chosen.  Be sure to take all of the medication as directed. Stop taking any medication if you develop a rash, tongue swelling or shortness of breath. Mothers who are breast feeding should consider pumping and discarding their breast milk while on these antibiotics. However, there is no consensus that infant exposure at these doses would be harmful.  Remember that medication creams can weaken latex  condoms. .Marland Kitchen  HOME CARE:  Good hygiene may prevent some types of vaginosis from recurring and may relieve some symptoms:  Avoid baths, hot tubs and whirlpool spas. Rinse soap from your outer genital area after a shower, and dry the area well to prevent irritation. Don't use scented or harsh soaps, such as those with deodorant or antibacterial action. Avoid irritants. These include scented tampons and pads. Wipe from front to back after using the toilet. Doing so avoids spreading fecal bacteria to your vagina.  Other things that may help prevent vaginosis include:  Don't douche. Your vagina doesn't require cleansing other than normal bathing. Repetitive douching disrupts the normal organisms that reside in the vagina and can actually increase your risk of vaginal infection. Douching won't clear up a vaginal infection. Use a latex condom. Both female and female latex condoms may help you avoid infections spread by sexual contact. Wear cotton underwear. Also wear pantyhose with a cotton crotch. If you feel comfortable without it, skip wearing underwear to bed. Yeast thrives in mCampbell SoupYour symptoms should improve in the next day or two.  GET HELP RIGHT AWAY IF:  You have pain in your lower abdomen ( pelvic area or over your ovaries) You develop nausea or vomiting You develop a fever Your discharge changes or worsens You have persistent pain with intercourse You develop shortness of breath, a rapid pulse, or you faint.  These symptoms could be signs of problems or infections that need to be evaluated by a medical provider now.  MAKE SURE YOU   Understand these instructions. Will watch your condition. Will get help right away if you are not doing well or get  worse.  Your e-visit answers were reviewed by a board certified advanced clinical practitioner to complete your personal care plan. Depending upon the condition, your plan could have included both over the counter or  prescription medications. Please review your pharmacy choice to make sure that you have choses a pharmacy that is open for you to pick up any needed prescription, Your safety is important to [place]. If you have drug allergies check your prescription carefully.   You can use MyChart to ask questions about today's visit, request a non-urgent call back, or ask for a work or school excuse for 24 hours related to this e-Visit. If it has been greater than 24 hours you will need to follow up with your provider, or enter a new e-Visit to address those concerns. You will get a MyChart message within the next two days asking about your experience. I hope that your e-visit has been valuable and will speed your recovery.  ===View-only below this line===   ----- Message -----    From: [name]    Sent: 07/30/2018  8:07 AM EST      To: E-Visit Mailing List Subject: E-Visit Submission: Vaginal Symptoms  E-Visit Submission: Vaginal Symptoms --------------------------------  Question: Which of the following are you experiencing? Answer:   Vaginal itching            Vaginal discharge  Question: Are you having pain while passing urine? Answer:   No, I have no pain while urinating  Question: Which of the following applies to your vaginal discharge Answer:   I have a white/milky discharge  Question: Are you pregnant? Answer:   I am confident that I am not pregnant  Question: Are you breastfeeding? Answer:   No  Question: Which of the following are you experiencing? Answer:   None of the above  Question: Do you have any sores on your genitals? Answer:   No  Question: Have you taken antibiotics recently? Answer:   I have not been on any antibiotics  Question: Do you do any of the following? Answer:   None of the above  Question: Which of the following applies to your menstrual period? Answer:   I have not had a menstrual period for a while  Question: Have you had similar symptoms in  the past? Answer:   Yes, I have had similar symptoms before  Question: When you had similar symptoms in  the past, did any of the following work? Answer:   Pills for yeast infection            Cream for yeast infection  Question: Do you have a fever? Answer:   No, I do not have a fever  Question: During the past 2 months, have you had sexual contact with a specific person for the first time? Answer:   No  Question: Has a person with whom you have had sexual contact been recently told they have a disease possibly acquired through sex? Answer:   No  Question: Please list your medication allergies that you may have ? (If 'none' , please list as 'none') Answer:   Amoxicillin  Question: Please list any additional comments  Answer:   In past diflucan has been successful in treating infection  A total of 5-10 minutes was spent evaluating this patients questionnaire and formulating a plan of care.

## 2018-08-01 ENCOUNTER — Ambulatory Visit (INDEPENDENT_AMBULATORY_CARE_PROVIDER_SITE_OTHER): Payer: 59 | Admitting: Family Medicine

## 2018-08-01 ENCOUNTER — Encounter: Payer: Self-pay | Admitting: Family Medicine

## 2018-08-01 VITALS — BP 117/85 | HR 109 | Temp 98.7°F | Ht 63.5 in | Wt 217.2 lb

## 2018-08-01 DIAGNOSIS — J101 Influenza due to other identified influenza virus with other respiratory manifestations: Secondary | ICD-10-CM | POA: Diagnosis not present

## 2018-08-01 DIAGNOSIS — R52 Pain, unspecified: Secondary | ICD-10-CM | POA: Diagnosis not present

## 2018-08-01 LAB — VERITOR FLU A/B WAIVED
INFLUENZA B: NEGATIVE
Influenza A: POSITIVE — AB

## 2018-08-01 MED ORDER — HYDROCOD POLST-CPM POLST ER 10-8 MG/5ML PO SUER: 5.0000 mL | Freq: Every evening | ORAL | 0 refills | Status: DC | PRN

## 2018-08-01 MED ORDER — BALOXAVIR MARBOXIL(80 MG DOSE) 2 X 40 MG PO TBPK: 80.0000 mg | ORAL_TABLET | Freq: Once | ORAL | 0 refills | Status: AC

## 2018-08-01 NOTE — Progress Notes (Signed)
BP 117/85 (BP Location: Left Arm, Patient Position: Sitting, Cuff Size: Large)   Pulse (!) 109   Temp 98.7 F (37.1 C)   Ht 5' 3.5" (1.613 m)   Wt 217 lb 4 oz (98.5 kg)   LMP 11/22/2015 (Approximate)   SpO2 97%   BMI 37.88 kg/m    Subjective:    Patient ID: Laura [name], female    DOB: [birth year], 33 y.o.   MRN: [id number]  HPI: Laura [name] is a 33 y.o. female  Chief Complaint  Patient presents with  . URI    Started Saturday, fever, chills and body aches   2 day history of cough, chills, fever, generalized body aches. Taking nyquil and ibuprofen and tylenol with minimal relief. Denies Cp, SOB, N/V/D. Lots of sick contacts at work. UTD on flu shot. No hx of pulmonary dz.   Relevant past medical, surgical, family and social history reviewed and updated as indicated. Interim medical history since our last visit reviewed. Allergies and medications reviewed and updated.  Review of Systems  Per HPI unless specifically indicated above     Objective:    BP 117/85 (BP Location: Left Arm, Patient Position: Sitting, Cuff Size: Large)   Pulse (!) 109   Temp 98.7 F (37.1 C)   Ht 5' 3.5" (1.613 m)   Wt 217 lb 4 oz (98.5 kg)   LMP 11/22/2015 (Approximate)   SpO2 97%   BMI 37.88 kg/m   Wt Readings from Last 3 Encounters:  08/01/18 217 lb 4 oz (98.5 kg)  07/21/18 220 lb 4.8 oz (99.9 kg)  07/18/18 219 lb 7 oz (99.5 kg)    Physical Exam Vitals signs and nursing note reviewed.  Constitutional:      Appearance: Normal appearance.  HENT:     Head: Atraumatic.     Right Ear: Tympanic membrane and external ear normal.     Left Ear: Tympanic membrane and external ear normal.     Nose: Congestion present.     Mouth/Throat:     Mouth: Mucous membranes are moist.     Pharynx: Posterior oropharyngeal erythema present.  Eyes:     Extraocular Movements: Extraocular movements intact.     Conjunctiva/sclera: Conjunctivae normal.  Neck:     Musculoskeletal: Normal  range of motion and neck supple.  Cardiovascular:     Rate and Rhythm: Normal rate and regular rhythm.     Heart sounds: Normal heart sounds.  Pulmonary:     Effort: Pulmonary effort is normal.     Breath sounds: Normal breath sounds. No wheezing.  Musculoskeletal: Normal range of motion.  Skin:    General: Skin is warm and dry.  Neurological:     Mental Status: She is alert and oriented to person, place, and time.  Psychiatric:        Mood and Affect: Mood normal.        Thought Content: Thought content normal.     Results for orders placed or performed in visit on 07/18/18  GC/Chlamydia Probe Amp  Result Value Ref Range   Chlamydia trachomatis, NAA Negative Negative   Neisseria gonorrhoeae by PCR Negative Negative  CBC with Differential/Platelet  Result Value Ref Range   WBC 6.2 3.4 - 10.8 x10E3/uL   RBC 4.70 3.77 - 5.28 x10E6/uL   Hemoglobin 13.9 11.1 - 15.9 g/dL   Hematocrit 41.0 34.0 - 46.6 %   MCV 87 79 - 97 fL   MCH 29.6 26.6 - 33.0 pg  MCHC 33.9 31.5 - 35.7 g/dL   RDW 13.4 11.7 - 15.4 %   Platelets 317 150 - 450 x10E3/uL   Neutrophils 51 Not Estab. %   Lymphs 40 Not Estab. %   Monocytes 7 Not Estab. %   Eos 1 Not Estab. %   Basos 1 Not Estab. %   Neutrophils Absolute 3.2 1.4 - 7.0 x10E3/uL   Lymphocytes Absolute 2.5 0.7 - 3.1 x10E3/uL   Monocytes Absolute 0.4 0.1 - 0.9 x10E3/uL   EOS (ABSOLUTE) 0.1 0.0 - 0.4 x10E3/uL   Basophils Absolute 0.0 0.0 - 0.2 x10E3/uL   Immature Granulocytes 0 Not Estab. %   Immature Grans (Abs) 0.0 0.0 - 0.1 x10E3/uL  Comprehensive metabolic panel  Result Value Ref Range   Glucose 87 65 - 99 mg/dL   BUN 10 6 - 20 mg/dL   Creatinine, Ser 0.76 0.57 - 1.00 mg/dL   GFR calc non Af Amer 104 >59 mL/min/1.73   GFR calc Af Amer 120 >59 mL/min/1.73   BUN/Creatinine Ratio 13 9 - 23   Sodium 141 134 - 144 mmol/L   Potassium 3.9 3.5 - 5.2 mmol/L   Chloride 102 96 - 106 mmol/L   CO2 21 20 - 29 mmol/L   Calcium 9.1 8.7 - 10.2 mg/dL    Total Protein 6.6 6.0 - 8.5 g/dL   Albumin 4.5 3.8 - 4.8 g/dL   Globulin, Total 2.1 1.5 - 4.5 g/dL   Albumin/Globulin Ratio 2.1 1.2 - 2.2   Bilirubin Total 0.3 0.0 - 1.2 mg/dL   Alkaline Phosphatase 94 39 - 117 IU/L   AST 13 0 - 40 IU/L   ALT 14 0 - 32 IU/L  Lipid Panel w/o Chol/HDL Ratio  Result Value Ref Range   Cholesterol, Total 179 100 - 199 mg/dL   Triglycerides 48 0 - 149 mg/dL   HDL 65 >39 mg/dL   VLDL Cholesterol Cal 10 5 - 40 mg/dL   LDL Calculated 104 (H) 0 - 99 mg/dL  TSH  Result Value Ref Range   TSH 1.590 0.450 - 4.500 uIU/mL  UA/M w/rflx Culture, Routine  Result Value Ref Range   Specific Gravity, UA 1.025 1.005 - 1.030   pH, UA 7.0 5.0 - 7.5   Color, UA Yellow Yellow   Appearance Ur Clear Clear   Leukocytes, UA Negative Negative   Protein, UA Negative Negative/Trace   Glucose, UA Negative Negative   Ketones, UA Negative Negative   RBC, UA Negative Negative   Bilirubin, UA Negative Negative   Urobilinogen, Ur 0.2 0.2 - 1.0 mg/dL   Nitrite, UA Negative Negative  HIV Antibody (routine testing w rflx)  Result Value Ref Range   HIV Screen 4th Generation wRfx Non Reactive Non Reactive  RPR  Result Value Ref Range   RPR Ser Ql Non Reactive Non Reactive  HSV(herpes simplex vrs) 1+2 ab-IgG  Result Value Ref Range   HSV 1 Glycoprotein G Ab, IgG <0.91 0.00 - 0.90 index   HSV 2 IgG, Type Spec <0.91 0.00 - 0.90 index      Assessment & Plan:   Problem List Items Addressed This Visit    None    Visit Diagnoses    Influenza A    -  Primary   Tx with xofluza, tussionex, supportive OTC treatment. F/u if worsening or not improving   Relevant Medications   Baloxavir Marboxil,80 MG Dose, (XOFLUZA) 2 x 40 MG TBPK   Other Relevant Orders   Veritor Flu  A/B Waived       Follow up plan: Return if symptoms worsen or fail to improve.

## 2018-08-05 ENCOUNTER — Telehealth: Payer: Self-pay | Admitting: Family Medicine

## 2018-08-05 NOTE — Telephone Encounter (Signed)
Forms faxed over

## 2018-08-05 NOTE — Telephone Encounter (Signed)
Copied from [place] [id number]. Topic: Quick Communication - See Telephone Encounter >> Aug 05, 2018 10:13 AM Blase Mess A wrote: CRM for notification. See Telephone encounter for: 08/05/18.  Patient is calling back. Stated that her boss indicated that she needed to file FMLA papers due to her sickness with the flu.  The dates that the patient has been out of work from 08/01/18-08/05/18.  Returning back to work on 08/08/18. Please advise [phone]

## 2018-08-05 NOTE — Telephone Encounter (Signed)
Forms are completed and ready to turn in

## 2018-08-15 ENCOUNTER — Ambulatory Visit (INDEPENDENT_AMBULATORY_CARE_PROVIDER_SITE_OTHER): Payer: 59 | Admitting: Family Medicine

## 2018-08-15 ENCOUNTER — Encounter: Payer: Self-pay | Admitting: Family Medicine

## 2018-08-15 VITALS — BP 109/78 | HR 119 | Temp 97.9°F | Ht 63.5 in | Wt 217.4 lb

## 2018-08-15 DIAGNOSIS — F319 Bipolar disorder, unspecified: Secondary | ICD-10-CM | POA: Diagnosis not present

## 2018-08-15 DIAGNOSIS — F17210 Nicotine dependence, cigarettes, uncomplicated: Secondary | ICD-10-CM | POA: Diagnosis not present

## 2018-08-15 DIAGNOSIS — F419 Anxiety disorder, unspecified: Secondary | ICD-10-CM

## 2018-08-15 DIAGNOSIS — F4310 Post-traumatic stress disorder, unspecified: Secondary | ICD-10-CM

## 2018-08-15 MED ORDER — BUPROPION HCL ER (XL) 300 MG PO TB24: 300.0000 mg | ORAL_TABLET | Freq: Every day | ORAL | 1 refills | Status: DC

## 2018-08-15 MED ORDER — LURASIDONE HCL 20 MG PO TABS: 20.0000 mg | ORAL_TABLET | Freq: Every day | ORAL | 1 refills | Status: DC
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
## 2018-08-18 ENCOUNTER — Ambulatory Visit (INDEPENDENT_AMBULATORY_CARE_PROVIDER_SITE_OTHER): Payer: 59 | Admitting: Obstetrics and Gynecology

## 2018-08-18 VITALS — BP 111/84 | HR 90 | Ht 63.0 in | Wt 216.8 lb

## 2018-08-18 DIAGNOSIS — E669 Obesity, unspecified: Secondary | ICD-10-CM

## 2018-08-18 DIAGNOSIS — F17210 Nicotine dependence, cigarettes, uncomplicated: Secondary | ICD-10-CM | POA: Insufficient documentation

## 2018-08-18 MED ORDER — CYANOCOBALAMIN 1000 MCG/ML IJ SOLN
1000.0000 ug | Freq: Once | INTRAMUSCULAR | Status: AC
  Administered 2018-08-18: 1000 ug via INTRAMUSCULAR

## 2018-08-18 MED ORDER — CYANOCOBALAMIN 1000 MCG/ML IJ SOLN: 1000.0000 ug | Freq: Once | INTRAMUSCULAR | Status: DC

## 2018-08-18 NOTE — Progress Notes (Signed)
Pt is here for wt, bp check, b-12 inj She is doing well, she didn't get to exercise much  Recently had the flu  08/18/18 wt- 216.8lb 07/21/18 wt- 220 lb   Waist 39.5 in

## 2018-08-18 NOTE — Assessment & Plan Note (Signed)
Some improvement on wellbutrin, will continue current regimen and work on stress control. Increase to 300 mg

## 2018-08-18 NOTE — Assessment & Plan Note (Signed)
With major trigger over the weekend exacerbating things. Will refer to Psychiatry for further management and counseling recommendations. Continue current regimen

## 2018-09-15 ENCOUNTER — Other Ambulatory Visit: Payer: Self-pay

## 2018-09-15 ENCOUNTER — Ambulatory Visit (INDEPENDENT_AMBULATORY_CARE_PROVIDER_SITE_OTHER): Payer: 59 | Admitting: Obstetrics and Gynecology

## 2018-09-15 ENCOUNTER — Encounter: Payer: Self-pay | Admitting: Obstetrics and Gynecology

## 2018-09-15 VITALS — BP 100/73 | HR 87 | Ht 63.0 in | Wt 218.1 lb

## 2018-09-15 DIAGNOSIS — E669 Obesity, unspecified: Secondary | ICD-10-CM | POA: Diagnosis not present

## 2018-09-15 MED ORDER — CYANOCOBALAMIN 1000 MCG/ML IJ SOLN
1000.0000 ug | Freq: Once | INTRAMUSCULAR | Status: AC
  Administered 2018-09-15: 1000 ug via INTRAMUSCULAR

## 2018-09-15 MED ORDER — CYANOCOBALAMIN 1000 MCG/ML IJ SOLN: 1000.0000 ug | Freq: Once | INTRAMUSCULAR | Status: DC

## 2018-09-15 NOTE — Progress Notes (Signed)
Pt is here for wt, bp check, b-12 inj She is doing well, denies any s/e   09/15/18 wt- 218.1lb 08/18/18 wt- 216.8lb   Waist 39.5in

## 2018-10-13 ENCOUNTER — Encounter: Payer: Self-pay | Admitting: Obstetrics and Gynecology

## 2018-10-18 ENCOUNTER — Encounter: Payer: Self-pay | Admitting: Family Medicine

## 2018-12-08 DIAGNOSIS — F902 Attention-deficit hyperactivity disorder, combined type: Secondary | ICD-10-CM | POA: Diagnosis not present

## 2018-12-08 DIAGNOSIS — R448 Other symptoms and signs involving general sensations and perceptions: Secondary | ICD-10-CM | POA: Diagnosis not present

## 2018-12-08 DIAGNOSIS — F319 Bipolar disorder, unspecified: Secondary | ICD-10-CM | POA: Diagnosis not present

## 2018-12-08 DIAGNOSIS — F411 Generalized anxiety disorder: Secondary | ICD-10-CM | POA: Diagnosis not present

## 2018-12-08 DIAGNOSIS — F6381 Intermittent explosive disorder: Secondary | ICD-10-CM | POA: Diagnosis not present

## 2018-12-22 DIAGNOSIS — F6381 Intermittent explosive disorder: Secondary | ICD-10-CM | POA: Diagnosis not present

## 2018-12-22 DIAGNOSIS — R448 Other symptoms and signs involving general sensations and perceptions: Secondary | ICD-10-CM | POA: Diagnosis not present

## 2018-12-22 DIAGNOSIS — F411 Generalized anxiety disorder: Secondary | ICD-10-CM | POA: Diagnosis not present

## 2018-12-22 DIAGNOSIS — F3112 Bipolar disorder, current episode manic without psychotic features, moderate: Secondary | ICD-10-CM | POA: Diagnosis not present

## 2019-02-12 ENCOUNTER — Telehealth: Payer: 59 | Admitting: Physician Assistant

## 2019-02-12 DIAGNOSIS — N898 Other specified noninflammatory disorders of vagina: Secondary | ICD-10-CM | POA: Diagnosis not present

## 2019-02-12 MED ORDER — FLUCONAZOLE 150 MG PO TABS: 150.0000 mg | ORAL_TABLET | Freq: Once | ORAL | 0 refills | Status: AC

## 2019-02-12 NOTE — Progress Notes (Signed)
We are sorry that you are not feeling well. Here is how we plan to help! Based on what you shared with me it looks like you: May have a yeast vaginosis  Vaginosis is an inflammation of the vagina that can result in discharge, itching and pain. The cause is usually a change in the normal balance of vaginal bacteria or an infection. Vaginosis can also result from reduced estrogen levels after menopause.  The most common causes of vaginosis are:   Bacterial vaginosis which results from an overgrowth of one on several organisms that are normally present in your vagina.   Yeast infections which are caused by a naturally occurring fungus called candida.   Vaginal atrophy (atrophic vaginosis) which results from the thinning of the vagina from reduced estrogen levels after menopause.   Trichomoniasis which is caused by a parasite and is commonly transmitted by sexual intercourse.  Factors that increase your risk of developing vaginosis include: . Medications, such as antibiotics and steroids . Uncontrolled diabetes . Use of hygiene products such as bubble bath, vaginal spray or vaginal deodorant . Douching . Wearing damp or tight-fitting clothing . Using an intrauterine device (IUD) for birth control . Hormonal changes, such as those associated with pregnancy, birth control pills or menopause . Sexual activity . Having a sexually transmitted infection  Your treatment plan is A single Diflucan (fluconazole) 150mg tablet once.  I have electronically sent this prescription into the pharmacy that you have chosen.  Be sure to take all of the medication as directed. Stop taking any medication if you develop a rash, tongue swelling or shortness of breath. Mothers who are breast feeding should consider pumping and discarding their breast milk while on these antibiotics. However, there is no consensus that infant exposure at these doses would be harmful.  Remember that medication creams can weaken latex  condoms. .   HOME CARE:  Good hygiene may prevent some types of vaginosis from recurring and may relieve some symptoms:  . Avoid baths, hot tubs and whirlpool spas. Rinse soap from your outer genital area after a shower, and dry the area well to prevent irritation. Don't use scented or harsh soaps, such as those with deodorant or antibacterial action. . Avoid irritants. These include scented tampons and pads. . Wipe from front to back after using the toilet. Doing so avoids spreading fecal bacteria to your vagina.  Other things that may help prevent vaginosis include:  . Don't douche. Your vagina doesn't require cleansing other than normal bathing. Repetitive douching disrupts the normal organisms that reside in the vagina and can actually increase your risk of vaginal infection. Douching won't clear up a vaginal infection. . Use a latex condom. Both female and female latex condoms may help you avoid infections spread by sexual contact. . Wear cotton underwear. Also wear pantyhose with a cotton crotch. If you feel comfortable without it, skip wearing underwear to bed. Yeast thrives in moist environments Your symptoms should improve in the next day or two.  GET HELP RIGHT AWAY IF:  . You have pain in your lower abdomen ( pelvic area or over your ovaries) . You develop nausea or vomiting . You develop a fever . Your discharge changes or worsens . You have persistent pain with intercourse . You develop shortness of breath, a rapid pulse, or you faint.  These symptoms could be signs of problems or infections that need to be evaluated by a medical provider now.  MAKE SURE YOU      Understand these instructions.  Will watch your condition.  Will get help right away if you are not doing well or get worse.  Your e-visit answers were reviewed by a board certified advanced clinical practitioner to complete your personal care plan. Depending upon the condition, your plan could have included  both over the counter or prescription medications. Please review your pharmacy choice to make sure that you have choses a pharmacy that is open for you to pick up any needed prescription, Your safety is important to [place]. If you have drug allergies check your prescription carefully.   You can use MyChart to ask questions about today's visit, request a non-urgent call back, or ask for a work or school excuse for 24 hours related to this e-Visit. If it has been greater than 24 hours you will need to follow up with your provider, or enter a new e-Visit to address those concerns. You will get a MyChart message within the next two days asking about your experience. I hope that your e-visit has been valuable and will speed your recovery.  I have spent 5 minutes in review of e-visit questionnaire, review and updating patient chart, medical decision making and response to patient.    Tenna Delaine, PA-C

## 2019-02-15 ENCOUNTER — Telehealth: Payer: 59 | Admitting: Nurse Practitioner

## 2019-02-15 DIAGNOSIS — B9689 Other specified bacterial agents as the cause of diseases classified elsewhere: Secondary | ICD-10-CM

## 2019-02-15 MED ORDER — METRONIDAZOLE 500 MG PO TABS: 500.0000 mg | ORAL_TABLET | Freq: Two times a day (BID) | ORAL | 0 refills | Status: DC

## 2019-02-15 NOTE — Progress Notes (Signed)
We are sorry that you are not feeling well. Here is how we plan to help! Based on what you shared with me it looks like you: May have a vaginosis due to bacteria. Bacterial vaginosis usually has a fishy smell to it. If the treatment plan below does not help- you will need to see your PCP for a face to face visit.  Vaginosis is an inflammation of the vagina that can result in discharge, itching and pain. The cause is usually a change in the normal balance of vaginal bacteria or an infection. Vaginosis can also result from reduced estrogen levels after menopause.  The most common causes of vaginosis are:   Bacterial vaginosis which results from an overgrowth of one on several organisms that are normally present in your vagina.   Yeast infections which are caused by a naturally occurring fungus called candida.   Vaginal atrophy (atrophic vaginosis) which results from the thinning of the vagina from reduced estrogen levels after menopause.   Trichomoniasis which is caused by a parasite and is commonly transmitted by sexual intercourse.  Factors that increase your risk of developing vaginosis include: Marland Kitchen Medications, such as antibiotics and steroids . Uncontrolled diabetes . Use of hygiene products such as bubble bath, vaginal spray or vaginal deodorant . Douching . Wearing damp or tight-fitting clothing . Using an intrauterine device (IUD) for birth control . Hormonal changes, such as those associated with pregnancy, birth control pills or menopause . Sexual activity . Having a sexually transmitted infection  Your treatment plan is Metronidazole or Flagyl 500mg  twice a day for 7 days.  I have electronically sent this prescription into the pharmacy that you have chosen.  Be sure to take all of the medication as directed. Stop taking any medication if you develop a rash, tongue swelling or shortness of breath. Mothers who are breast feeding should consider pumping and discarding their breast  milk while on these antibiotics. However, there is no consensus that infant exposure at these doses would be harmful.  Remember that medication creams can weaken latex condoms. Marland Kitchen   HOME CARE:  Good hygiene may prevent some types of vaginosis from recurring and may relieve some symptoms:  . Avoid baths, hot tubs and whirlpool spas. Rinse soap from your outer genital area after a shower, and dry the area well to prevent irritation. Don't use scented or harsh soaps, such as those with deodorant or antibacterial action. Marland Kitchen Avoid irritants. These include scented tampons and pads. . Wipe from front to back after using the toilet. Doing so avoids spreading fecal bacteria to your vagina.  Other things that may help prevent vaginosis include:  Marland Kitchen Don't douche. Your vagina doesn't require cleansing other than normal bathing. Repetitive douching disrupts the normal organisms that reside in the vagina and can actually increase your risk of vaginal infection. Douching won't clear up a vaginal infection. . Use a latex condom. Both female and female latex condoms may help you avoid infections spread by sexual contact. . Wear cotton underwear. Also wear pantyhose with a cotton crotch. If you feel comfortable without it, skip wearing underwear to bed. Yeast thrives in [employer] Your symptoms should improve in the next day or two.  GET HELP RIGHT AWAY IF:  . You have pain in your lower abdomen ( pelvic area or over your ovaries) . You develop nausea or vomiting . You develop a fever . Your discharge changes or worsens . You have persistent pain with intercourse . You develop  shortness of breath, a rapid pulse, or you faint.  These symptoms could be signs of problems or infections that need to be evaluated by a medical provider now.  MAKE SURE YOU    Understand these instructions.  Will watch your condition.  Will get help right away if you are not doing well or get worse.  Your e-visit  answers were reviewed by a board certified advanced clinical practitioner to complete your personal care plan. Depending upon the condition, your plan could have included both over the counter or prescription medications. Please review your pharmacy choice to make sure that you have choses a pharmacy that is open for you to pick up any needed prescription, Your safety is important to [place]. If you have drug allergies check your prescription carefully.   You can use MyChart to ask questions about today's visit, request a non-urgent call back, or ask for a work or school excuse for 24 hours related to this e-Visit. If it has been greater than 24 hours you will need to follow up with your provider, or enter a new e-Visit to address those concerns. You will get a MyChart message within the next two days asking about your experience. I hope that your e-visit has been valuable and will speed your recovery.

## 2019-02-20 NOTE — Progress Notes (Signed)
Response to patient was not in chart. Patient was called and she received her rx from pharmacy.

## 2019-04-13 ENCOUNTER — Ambulatory Visit
Admission: RE | Admit: 2019-04-13 | Discharge: 2019-04-13 | Disposition: A | Discharge status: Home / Self Care | Payer: 59 | Source: Ambulatory Visit | Attending: Family Medicine | Admitting: Family Medicine

## 2019-04-13 ENCOUNTER — Other Ambulatory Visit: Payer: Self-pay

## 2019-04-13 ENCOUNTER — Encounter: Payer: Self-pay | Admitting: Family Medicine

## 2019-04-13 ENCOUNTER — Ambulatory Visit (INDEPENDENT_AMBULATORY_CARE_PROVIDER_SITE_OTHER): Payer: 59 | Admitting: Family Medicine

## 2019-04-13 VITALS — BP 124/86 | HR 80 | Temp 98.0°F

## 2019-04-13 DIAGNOSIS — K2900 Acute gastritis without bleeding: Secondary | ICD-10-CM

## 2019-04-13 DIAGNOSIS — F331 Major depressive disorder, recurrent, moderate: Secondary | ICD-10-CM

## 2019-04-13 DIAGNOSIS — G8929 Other chronic pain: Secondary | ICD-10-CM | POA: Diagnosis not present

## 2019-04-13 DIAGNOSIS — M79671 Pain in right foot: Secondary | ICD-10-CM | POA: Insufficient documentation

## 2019-04-13 DIAGNOSIS — M5442 Lumbago with sciatica, left side: Secondary | ICD-10-CM

## 2019-04-13 DIAGNOSIS — M545 Low back pain: Secondary | ICD-10-CM | POA: Diagnosis not present

## 2019-04-13 DIAGNOSIS — M79674 Pain in right toe(s): Secondary | ICD-10-CM | POA: Diagnosis not present

## 2019-04-13 MED ORDER — CYCLOBENZAPRINE HCL 10 MG PO TABS: 10.0000 mg | ORAL_TABLET | Freq: Three times a day (TID) | ORAL | 0 refills | Status: DC | PRN

## 2019-04-13 MED ORDER — BUPROPION HCL ER (XL) 300 MG PO TB24: 300.0000 mg | ORAL_TABLET | Freq: Every day | ORAL | 1 refills | Status: DC

## 2019-04-13 MED ORDER — PREDNISONE 10 MG PO TABS: ORAL_TABLET | ORAL | 0 refills | Status: DC

## 2019-04-13 MED ORDER — PANTOPRAZOLE SODIUM 40 MG PO TBEC: 40.0000 mg | DELAYED_RELEASE_TABLET | Freq: Every day | ORAL | 0 refills | Status: DC

## 2019-04-13 NOTE — Assessment & Plan Note (Signed)
Improved with wellbutrin, continue current regimen.

## 2019-04-13 NOTE — Progress Notes (Signed)
BP 124/86   Pulse 80   Temp 98 F (36.7 C)   LMP 11/22/2015 (Approximate)   SpO2 98%    Subjective:    Patient ID: Laura [name], female    DOB: June 17, 1986, 33 y.o.   MRN: [id number]  HPI: Laura [name] is a 33 y.o. female  Chief Complaint  Patient presents with  . Back Pain    Patient states that she has been dealing with back pain for over a month and would like to know what options that she has, possible some imaging  . Abdominal Pain    Patient states that she has been taking naproxen 220mg  3x a day and unsure if she has injured her stomach  . Toe Pain    right big toe, some swelling on the bottom   Dull b/l low back pain x 1 month, seems to get progressively worse during the day at work. Hx of sciatica. Taking tylenol and naproxen without relief. Some numbness or tingling down left leg especially at night. Denies fevers, chills, leg weakness, urinary sxs, incontinence.   Upper abdomen tender since taking all the naproxen the past week. Thinks she's got a little gastritis going on. No vomiting, melena, weight loss. Not taking anything OTC for this.   Right foot pain with some swelling at base of great toe and below that some. Notes the toe seems to be popping more often than usual lately since onset and it's increasingly more painful each time it pops. Notes some swelling but no redness or warmth. No definitive injury to the area. Not trying anything for relief.   Recently loss grandmother, had been feeling ok off her antidepressants but wanting to stay on the wellbutrin again. Restarted it several weeks ago on her own and doing better since. No side effects, SI/HI.   Relevant past medical, surgical, family and social history reviewed and updated as indicated. Interim medical history since our last visit reviewed. Allergies and medications reviewed and updated.  Review of Systems  Per HPI unless specifically indicated above     Objective:    BP 124/86   Pulse  80   Temp 98 F (36.7 C)   LMP 11/22/2015 (Approximate)   SpO2 98%   Wt Readings from Last 3 Encounters:  09/15/18 218 lb 1.6 oz (98.9 kg)  08/18/18 216 lb 12.8 oz (98.3 kg)  08/15/18 217 lb 7 oz (98.6 kg)    Physical Exam Vitals signs and nursing note reviewed.  Constitutional:      Appearance: Normal appearance. She is not ill-appearing.  HENT:     Head: Atraumatic.  Eyes:     Extraocular Movements: Extraocular movements intact.     Conjunctiva/sclera: Conjunctivae normal.  Neck:     Musculoskeletal: Normal range of motion and neck supple.  Cardiovascular:     Rate and Rhythm: Normal rate and regular rhythm.     Heart sounds: Normal heart sounds.  Pulmonary:     Effort: Pulmonary effort is normal.     Breath sounds: Normal breath sounds.  Abdominal:     General: Bowel sounds are normal.     Palpations: Abdomen is soft.     Tenderness: There is no abdominal tenderness. There is no right CVA tenderness, left CVA tenderness or guarding.  Musculoskeletal: Normal range of motion.        General: Tenderness (b/l low back ttp) present.     Comments: - SLR  Skin:    General: Skin is  warm and dry.  Neurological:     Mental Status: She is alert and oriented to person, place, and time.  Psychiatric:        Mood and Affect: Mood normal.        Thought Content: Thought content normal.        Judgment: Judgment normal.     Results for orders placed or performed in visit on 08/01/18  Veritor Flu A/B Waived  Result Value Ref Range   Influenza A Positive (A) Negative   Influenza B Negative Negative      Assessment & Plan:   Problem List Items Addressed This Visit      Other   Depression    Improved with wellbutrin, continue current regimen.       Relevant Medications   buPROPion (WELLBUTRIN XL) 300 MG 24 hr tablet    Other Visit Diagnoses    Chronic bilateral low back pain with left-sided sciatica    -  Primary   Obtain x-ray, prednisone and flexeril sent. May  benefit from PT, core strength exercises, weight loss   Relevant Medications   buPROPion (WELLBUTRIN XL) 300 MG 24 hr tablet   predniSONE (DELTASONE) 10 MG tablet   cyclobenzaprine (FLEXERIL) 10 MG tablet   Other Relevant Orders   DG Lumbar Spine Complete   Right foot pain       X-ray ordered, supportive care reviewed. Prednisone may help with this issue as well.    Relevant Orders   DG Foot Complete Right   Other acute gastritis without hemorrhage       Protonix, bland diet, avoid NSAIDs particularly during prednisone course. F/u if worsening or not improving       Follow up plan: Return in about 6 months (around 10/12/2019) for CPE.

## 2019-04-19 ENCOUNTER — Other Ambulatory Visit: Payer: Self-pay

## 2019-04-19 ENCOUNTER — Ambulatory Visit (INDEPENDENT_AMBULATORY_CARE_PROVIDER_SITE_OTHER): Payer: 59 | Admitting: Family Medicine

## 2019-04-19 ENCOUNTER — Encounter: Payer: Self-pay | Admitting: Family Medicine

## 2019-04-19 VITALS — BP 139/97 | HR 98 | Temp 97.2°F

## 2019-04-19 DIAGNOSIS — J029 Acute pharyngitis, unspecified: Secondary | ICD-10-CM | POA: Diagnosis not present

## 2019-04-19 DIAGNOSIS — R05 Cough: Secondary | ICD-10-CM

## 2019-04-19 DIAGNOSIS — R059 Cough, unspecified: Secondary | ICD-10-CM

## 2019-04-19 MED ORDER — HYDROCOD POLST-CPM POLST ER 10-8 MG/5ML PO SUER: 5.0000 mL | Freq: Every evening | ORAL | 0 refills | Status: DC | PRN

## 2019-04-19 NOTE — Progress Notes (Signed)
BP (!) 139/97   Pulse 98   Temp (!) 97.2 F (36.2 C) (Oral)   LMP 11/22/2015 (Approximate)    Subjective:    Patient ID: Laura [name], female    DOB: 25-Nov-1985, 33 y.o.   MRN: [id number]  HPI: Laura [name] is a 33 y.o. female  Chief Complaint  Patient presents with  . URI    pt states she has had a productive cough, congestion, pressure and a headache for the past 2 days. Had COVID test this morning.    . This visit was completed via WebEx due to the restrictions of the COVID-19 pandemic. All issues as above were discussed and addressed. Physical exam was done as above through visual confirmation on WebEx. If it was felt that the patient should be evaluated in the office, they were directed there. The patient verbally consented to this visit. . Location of the patient: home . Location of the provider: work . Those involved with this call:  . Provider: Merrie Roof, PA-C . CMA: Tiffany Reel, CMA . Front Desk/Registration: Jill Side  . Time spent on call: 15 minutes with patient face to face via video conference. More than 50% of this time was spent in counseling and coordination of care. 5 minutes total spent in review of patient's record and preparation of their chart. I verified patient identity using two factors (patient name and date of birth). Patient consents verbally to being seen via telemedicine visit today.   Scratchy throat and cough 2 days ago, got much worse yesterday with fatigue, stronger more painful productive cough. Taking delsym and sudafed and then some nyquil without much relief. Called [employer] this morning and was tested for COVID 19, awaiting results. Denies fever, chills, body aches, CP, SOB. 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   Relevant past medical, surgical, family and social history reviewed and updated as indicated. Interim medical history since our last visit reviewed. Allergies and medications reviewed and  updated.  Review of Systems  Per HPI unless specifically indicated above     Objective:    BP (!) 139/97   Pulse 98   Temp (!) 97.2 F (36.2 C) (Oral)   LMP 11/22/2015 (Approximate)   Wt Readings from Last 3 Encounters:  09/15/18 218 lb 1.6 oz (98.9 kg)  08/18/18 216 lb 12.8 oz (98.3 kg)  08/15/18 217 lb 7 oz (98.6 kg)    Physical Exam Vitals signs and nursing note reviewed.  Constitutional:      General: She is not in acute distress.    Appearance: Normal appearance.  HENT:     Head: Atraumatic.     Right Ear: External ear normal.     Left Ear: External ear normal.     Nose: Congestion present.     Mouth/Throat:     Mouth: Mucous membranes are moist.     Pharynx: Oropharynx is clear. Posterior oropharyngeal erythema present.  Eyes:     Extraocular Movements: Extraocular movements intact.     Conjunctiva/sclera: Conjunctivae normal.  Neck:     Musculoskeletal: Normal range of motion.  Cardiovascular:     Comments: Unable to assess via virtual visit Pulmonary:     Effort: Pulmonary effort is normal. No respiratory distress.  Musculoskeletal: Normal range of motion.  Skin:    General: Skin is dry.     Findings: No erythema.  Neurological:     Mental Status: She is alert and oriented to person,  place, and time.  Psychiatric:        Mood and Affect: Mood normal.        Thought Content: Thought content normal.        Judgment: Judgment normal.     Results for orders placed or performed in visit on 08/01/18  Veritor Flu A/B Waived  Result Value Ref Range   Influenza A Positive (A) Negative   Influenza B Negative Negative      Assessment & Plan:   Problem List Items Addressed This Visit    None    Visit Diagnoses    Cough    -  Primary   Sore throat        Await COVID 19 results, continue home quarantine until then. Discussed OTC remedies and supportive care. Strict return precautions given for worsening sxs.    Follow up plan: Return if symptoms  worsen or fail to improve.

## 2019-04-21 ENCOUNTER — Other Ambulatory Visit: Payer: Self-pay | Admitting: Family Medicine

## 2019-04-21 MED ORDER — AZITHROMYCIN 250 MG PO TABS: ORAL_TABLET | ORAL | 0 refills | Status: DC

## 2019-04-24 MED ORDER — AZITHROMYCIN 250 MG PO TABS: ORAL_TABLET | ORAL | 0 refills | Status: DC

## 2019-04-24 NOTE — Addendum Note (Signed)
Addended by: Valerie Roys on: 04/24/2019 11:53 AM   Modules accepted: Orders

## 2019-05-05 ENCOUNTER — Other Ambulatory Visit: Payer: Self-pay | Admitting: Family Medicine

## 2019-05-05 ENCOUNTER — Ambulatory Visit: Payer: 59 | Admitting: Family Medicine

## 2019-07-28 ENCOUNTER — Encounter: Payer: Self-pay | Admitting: Family Medicine

## 2019-07-31 ENCOUNTER — Other Ambulatory Visit: Payer: Self-pay | Admitting: Family Medicine

## 2019-07-31 MED ORDER — BUPROPION HCL ER (XL) 300 MG PO TB24: 300.0000 mg | ORAL_TABLET | Freq: Every day | ORAL | 0 refills | Status: DC

## 2019-09-05 ENCOUNTER — Other Ambulatory Visit: Payer: Self-pay | Admitting: Family Medicine

## 2019-09-05 NOTE — Telephone Encounter (Signed)
Patient last seen 04/13/19 and has appointment 10/12/19.

## 2019-09-06 MED ORDER — CYCLOBENZAPRINE HCL 10 MG PO TABS: 10.0000 mg | ORAL_TABLET | Freq: Three times a day (TID) | ORAL | 0 refills | Status: DC | PRN

## 2019-09-19 ENCOUNTER — Encounter: Payer: Self-pay | Admitting: Family Medicine

## 2019-10-12 ENCOUNTER — Encounter: Payer: 59 | Admitting: Family Medicine

## 2019-10-23 ENCOUNTER — Encounter: Payer: Self-pay | Admitting: Family Medicine

## 2019-12-28 ENCOUNTER — Other Ambulatory Visit: Payer: Self-pay

## 2019-12-28 ENCOUNTER — Encounter: Payer: Self-pay | Admitting: Family Medicine

## 2019-12-28 ENCOUNTER — Other Ambulatory Visit: Payer: Self-pay | Admitting: Family Medicine

## 2019-12-28 ENCOUNTER — Ambulatory Visit (INDEPENDENT_AMBULATORY_CARE_PROVIDER_SITE_OTHER): Payer: No Typology Code available for payment source | Admitting: Family Medicine

## 2019-12-28 VITALS — BP 116/83 | HR 87 | Temp 98.2°F | Ht 62.0 in | Wt 207.0 lb

## 2019-12-28 DIAGNOSIS — N898 Other specified noninflammatory disorders of vagina: Secondary | ICD-10-CM

## 2019-12-28 DIAGNOSIS — F419 Anxiety disorder, unspecified: Secondary | ICD-10-CM

## 2019-12-28 DIAGNOSIS — N393 Stress incontinence (female) (male): Secondary | ICD-10-CM | POA: Diagnosis not present

## 2019-12-28 DIAGNOSIS — Z136 Encounter for screening for cardiovascular disorders: Secondary | ICD-10-CM

## 2019-12-28 DIAGNOSIS — Z0000 Encounter for general adult medical examination without abnormal findings: Secondary | ICD-10-CM

## 2019-12-28 DIAGNOSIS — G4700 Insomnia, unspecified: Secondary | ICD-10-CM | POA: Diagnosis not present

## 2019-12-28 DIAGNOSIS — R5383 Other fatigue: Secondary | ICD-10-CM

## 2019-12-28 LAB — UA/M W/RFLX CULTURE, ROUTINE
Bilirubin, UA: NEGATIVE
Glucose, UA: NEGATIVE
Ketones, UA: NEGATIVE
Leukocytes,UA: NEGATIVE
Nitrite, UA: NEGATIVE
Protein,UA: NEGATIVE
RBC, UA: NEGATIVE
Specific Gravity, UA: 1.02 (ref 1.005–1.030)
Urobilinogen, Ur: 0.2 mg/dL (ref 0.2–1.0)
pH, UA: 7.5 (ref 5.0–7.5)

## 2019-12-28 LAB — WET PREP FOR TRICH, YEAST, CLUE
Clue Cell Exam: POSITIVE — AB
Trichomonas Exam: NEGATIVE
Yeast Exam: NEGATIVE

## 2019-12-28 MED ORDER — QUETIAPINE FUMARATE 50 MG PO TABS: 50.0000 mg | ORAL_TABLET | Freq: Every day | ORAL | 0 refills | Status: DC

## 2019-12-28 NOTE — Progress Notes (Signed)
BP 116/83   Pulse 87   Temp 98.2 F (36.8 C) (Oral)   Ht 5\' 2"  (1.575 m)   Wt 207 lb (93.9 kg)   LMP 11/22/2015 (Approximate)   SpO2 99%   BMI 37.86 kg/m    Subjective:    Patient ID: Laura [name], female    DOB: Apr 12, 1986, 34 y.o.   MRN: [id number]  HPI: Laura [name] is a 34 y.o. female presenting on 12/28/2019 for comprehensive medical examination. Current medical complaints include:see below  Ongoing sleep issues, anxious, irritable. Feels moods overall are doing a bit better despite this. Still taking the wellbutrin which she feels benefits her. Denies SI/HI, anhedonia, crying spells.   Stress incontinence since having her twins in 2010 and hysterectomy. Worsening over time, not improved with pelvic floor exercises.   Intermittent white vaginal discharge, irritation. Denies known exposure to STIs, rashes, fever, chills, dysuria, abdominal pain.   She currently lives with: Menopausal Symptoms: no  Depression Screen done today and results listed below:  Depression screen [facility] 2/9 12/28/2019 07/15/2017 03/31/2017 03/03/2017 10/16/2016  Decreased Interest 1 1 0 1 2  Down, Depressed, Hopeless 1 1 0 1 3  PHQ - 2 Score 2 2 0 2 5  Altered sleeping 3 3 - - 3  Tired, decreased energy 3 3 - - 3  Change in appetite 3 3 - - 3  Feeling bad or failure about yourself  1 1 - - 3  Trouble concentrating 3 0 - - 2  Moving slowly or fidgety/restless 0 1 - - 1  Suicidal thoughts 0 0 - - 1  PHQ-9 Score 15 13 - - 21    The patient does not have a history of falls. I did complete a risk assessment for falls. A plan of care for falls was documented.   Past Medical History:  Past Medical History:  Diagnosis Date  . Anxiety   . Bipolar disorder ([place])    past hx of  . Complication of anesthesia    "woke up during surgery"  . Depression   . Heavy periods   . Incontinence of urine   . Increased BMI   . Indigestion   . PTSD (post-traumatic stress disorder)   . Tobacco user      Surgical History:  Past Surgical History:  Procedure Laterality Date  . ABDOMINAL HYSTERECTOMY  2017  . CESAREAN SECTION  2010  . LAPAROSCOPIC VAGINAL HYSTERECTOMY WITH SALPINGECTOMY Bilateral 12/16/2015   Procedure: LAPAROSCOPIC ASSISTED VAGINAL HYSTERECTOMY WITH BILATERAL SALPINGECTOMY;  Surgeon: Brayton Mars, MD;  Location: [facility] ORS;  Service: Gynecology;  Laterality: Bilateral;  . TUBAL LIGATION  2011    Medications:  Current Outpatient Medications on File Prior to Visit  Medication Sig  . cyclobenzaprine (FLEXERIL) 10 MG tablet Take 1 tablet (10 mg total) by mouth 3 (three) times daily as needed for muscle spasms. (Patient not taking: Reported on 12/28/2019)   Current Facility-Administered Medications on File Prior to Visit  Medication  . cyanocobalamin ((VITAMIN B-12)) injection 1,000 mcg    Allergies:  Allergies  Allergen Reactions  . Amoxicillin Rash and Other (See Comments)    GI upset    Social History:  Social History   Socioeconomic History  . Marital status: Married    Spouse name: Not on file  . Number of children: Not on file  . Years of education: Not on file  . Highest education level: Not on file  Occupational History  . Not on  file  Tobacco Use  . Smoking status: Current Every Day Smoker    Packs/day: 0.25    Years: 11.00    Pack years: 2.75    Types: Cigarettes  . Smokeless tobacco: Never Used  Vaping Use  . Vaping Use: Never used  Substance and Sexual Activity  . Alcohol use: Yes    Alcohol/week: 0.0 - 6.0 standard drinks    Comment: occasionally; beer  . Drug use: No  . Sexual activity: Yes    Birth control/protection: Surgical  Other Topics Concern  . Not on file  Social History Narrative  . Not on file   Social Determinants of Health   Financial Resource Strain:   . Difficulty of Paying Living Expenses:   Food Insecurity:   . Worried About Charity fundraiser in the Last Year:   . Arboriculturist in the Last Year:    Transportation Needs:   . Film/video editor (Medical):   Marland Kitchen Lack of Transportation (Non-Medical):   Physical Activity:   . Days of Exercise per Week:   . Minutes of Exercise per Session:   Stress:   . Feeling of Stress :   Social Connections:   . Frequency of Communication with Friends and Family:   . Frequency of Social Gatherings with Friends and Family:   . Attends Religious Services:   . Active Member of Clubs or Organizations:   . Attends Archivist Meetings:   Marland Kitchen Marital Status:   Intimate Partner Violence:   . Fear of Current or Ex-Partner:   . Emotionally Abused:   Marland Kitchen Physically Abused:   . Sexually Abused:    Social History   Tobacco Use  Smoking Status Current Every Day Smoker  . Packs/day: 0.25  . Years: 11.00  . Pack years: 2.75  . Types: Cigarettes  Smokeless Tobacco Never Used   Social History   Substance and Sexual Activity  Alcohol Use Yes  . Alcohol/week: 0.0 - 6.0 standard drinks   Comment: occasionally; beer    Family History:  Family History  Problem Relation Age of Onset  . Mental illness Mother   . Diabetes Maternal Grandmother   . Heart disease Maternal Grandfather   . Cancer Neg Hx     Past medical history, surgical history, medications, allergies, family history and social history reviewed with patient today and changes made to appropriate areas of the chart.   Review of Systems - General ROS: negative Psychological ROS: positive for - anxiety and sleep disturbances Ophthalmic ROS: negative ENT ROS: negative Allergy and Immunology ROS: negative Hematological and Lymphatic ROS: negative Endocrine ROS: negative Breast ROS: negative for breast lumps Respiratory ROS: no cough, shortness of breath, or wheezing Cardiovascular ROS: no chest pain or dyspnea on exertion Gastrointestinal ROS: no abdominal pain, change in bowel habits, or black or bloody stools Genito-Urinary ROS: no dysuria, trouble voiding, or  hematuria Musculoskeletal ROS: negative Neurological ROS: no TIA or stroke symptoms Dermatological ROS: negative All other ROS negative except what is listed above and in the HPI.      Objective:    BP 116/83   Pulse 87   Temp 98.2 F (36.8 C) (Oral)   Ht 5\' 2"  (1.575 m)   Wt 207 lb (93.9 kg)   LMP 11/22/2015 (Approximate)   SpO2 99%   BMI 37.86 kg/m   Wt Readings from Last 3 Encounters:  12/28/19 207 lb (93.9 kg)  09/15/18 218 lb 1.6 oz (98.9 kg)  08/18/18 216 lb 12.8 oz (98.3 kg)    Physical Exam Vitals and nursing note reviewed. Exam conducted with a chaperone present.  Constitutional:      General: She is not in acute distress.    Appearance: She is well-developed.  HENT:     Head: Atraumatic.     Right Ear: External ear normal.     Left Ear: External ear normal.     Nose: Nose normal.     Mouth/Throat:     Pharynx: No oropharyngeal exudate.  Eyes:     General: No scleral icterus.    Conjunctiva/sclera: Conjunctivae normal.     Pupils: Pupils are equal, round, and reactive to light.  Neck:     Thyroid: No thyromegaly.  Cardiovascular:     Rate and Rhythm: Normal rate and regular rhythm.     Heart sounds: Normal heart sounds.  Pulmonary:     Effort: Pulmonary effort is normal. No respiratory distress.     Breath sounds: Normal breath sounds.  Chest:     Breasts:        Right: No mass, skin change or tenderness.        Left: No mass, skin change or tenderness.  Abdominal:     General: Bowel sounds are normal.     Palpations: Abdomen is soft. There is no mass.     Tenderness: There is no abdominal tenderness.  Genitourinary:    Vagina: Vaginal discharge present.  Musculoskeletal:        General: No tenderness. Normal range of motion.     Cervical back: Normal range of motion and neck supple.  Lymphadenopathy:     Cervical: No cervical adenopathy.     Upper Body:     Right upper body: No axillary adenopathy.     Left upper body: No axillary adenopathy.   Skin:    General: Skin is warm and dry.     Findings: No rash.  Neurological:     Mental Status: She is alert and oriented to person, place, and time.     Cranial Nerves: No cranial nerve deficit.  Psychiatric:        Behavior: Behavior normal.     Results for orders placed or performed in visit on 12/28/19  [place], YEAST, CLUE   Specimen: Urine   Urine  Result Value Ref Range   Trichomonas Exam Negative Negative   Yeast Exam Negative Negative   Clue Cell Exam Positive (A) Negative  CBC with Differential/Platelet  Result Value Ref Range   WBC 5.8 3.4 - 10.8 x10E3/uL   RBC 4.61 3.77 - 5.28 x10E6/uL   Hemoglobin 14.2 11.1 - 15.9 g/dL   Hematocrit 42.0 34.0 - 46.6 %   MCV 91 79 - 97 fL   MCH 30.8 26.6 - 33.0 pg   MCHC 33.8 31 - 35 g/dL   RDW 13.0 11.7 - 15.4 %   Platelets 278 150 - 450 x10E3/uL   Neutrophils 46 Not Estab. %   Lymphs 46 Not Estab. %   Monocytes 7 Not Estab. %   Eos 1 Not Estab. %   Basos 0 Not Estab. %   Neutrophils Absolute 2.6 1 - 7 x10E3/uL   Lymphocytes Absolute 2.7 0 - 3 x10E3/uL   Monocytes Absolute 0.4 0 - 0 x10E3/uL   EOS (ABSOLUTE) 0.0 0.0 - 0.4 x10E3/uL   Basophils Absolute 0.0 0 - 0 x10E3/uL   Immature Granulocytes 0 Not Estab. %   Immature  Grans (Abs) 0.0 0.0 - 0.1 x10E3/uL  Comprehensive metabolic panel  Result Value Ref Range   Glucose 78 65 - 99 mg/dL   BUN 10 6 - 20 mg/dL   Creatinine, Ser 0.78 0.57 - 1.00 mg/dL   GFR calc non Af Amer 100 >59 mL/min/1.73   GFR calc Af Amer 116 >59 mL/min/1.73   BUN/Creatinine Ratio 13 9 - 23   Sodium 139 134 - 144 mmol/L   Potassium 4.2 3.5 - 5.2 mmol/L   Chloride 104 96 - 106 mmol/L   CO2 22 20 - 29 mmol/L   Calcium 9.0 8.7 - 10.2 mg/dL   Total Protein 6.7 6.0 - 8.5 g/dL   Albumin 4.3 3.8 - 4.8 g/dL   Globulin, Total 2.4 1.5 - 4.5 g/dL   Albumin/Globulin Ratio 1.8 1.2 - 2.2   Bilirubin Total 0.3 0.0 - 1.2 mg/dL   Alkaline Phosphatase 90 48 - 121 IU/L   AST 18 0 - 40 IU/L   ALT  17 0 - 32 IU/L  Lipid Panel w/o Chol/HDL Ratio  Result Value Ref Range   Cholesterol, Total 162 100 - 199 mg/dL   Triglycerides 29 0 - 149 mg/dL   HDL 55 >39 mg/dL   VLDL Cholesterol Cal 7 5 - 40 mg/dL   LDL Chol Calc (NIH) 100 (H) 0 - 99 mg/dL  TSH  Result Value Ref Range   TSH 1.490 0.450 - 4.500 uIU/mL  UA/M w/rflx Culture, Routine   Specimen: Urine   Urine  Result Value Ref Range   Specific Gravity, UA 1.020 1.005 - 1.030   pH, UA 7.5 5.0 - 7.5   Color, UA Yellow Yellow   Appearance Ur Clear Clear   Leukocytes,UA Negative Negative   Protein,UA Negative Negative/Trace   Glucose, UA Negative Negative   Ketones, UA Negative Negative   RBC, UA Negative Negative   Bilirubin, UA Negative Negative   Urobilinogen, Ur 0.2 0.2 - 1.0 mg/dL   Nitrite, UA Negative Negative  Vitamin B12  Result Value Ref Range   Vitamin B-12 644 232 - 1,245 pg/mL  VITAMIN D 25 Hydroxy (Vit-D Deficiency, Fractures)  Result Value Ref Range   Vit D, 25-Hydroxy 13.4 (L) 30.0 - 100.0 ng/mL      Assessment & Plan:   Problem List Items Addressed This Visit      Other   Anxiety    Some breakthrough sxs, will start seroquel at bedtime to help with her extreme fatigue and insomnia and increase as needed. Continue wellbutrin. No longer following with Psychiatry      Insomnia - Primary    Trial seroquel at bedtime, increase prn. Sleep hygiene reviewed      Stress incontinence    Referral placed to Urology per her request. Continue pelvic floor exercises. U/A today      Relevant Orders   Ambulatory referral to Urology    Other Visit Diagnoses    Annual physical exam       Relevant Orders   CBC with Differential/Platelet (Completed)   Comprehensive metabolic panel (Completed)   TSH (Completed)   UA/M w/rflx Culture, Routine (Completed)   CBC with Differential/Platelet   Comprehensive metabolic panel   TSH   UA/M w/rflx Culture, Routine   Vaginal discharge       Relevant Orders   WET PREP  FOR [place], YEAST, CLUE (Completed)   GC/Chlamydia Probe Amp   Fatigue, unspecified type       Relevant Orders   Vitamin  B12 (Completed)   VITAMIN D 25 Hydroxy (Vit-D Deficiency, Fractures) (Completed)   Screening for cardiovascular condition       Relevant Orders   Lipid Panel w/o Chol/HDL Ratio (Completed)   Lipid Panel w/o Chol/HDL Ratio       Follow up plan: Return in about 4 weeks (around 01/25/2020) for sleep f/u.   LABORATORY TESTING:  - Pap smear: not applicable  IMMUNIZATIONS:   - Tdap: Tetanus vaccination status reviewed: last tetanus booster within 10 years. - Influenza: Up to date  PATIENT COUNSELING:   Advised to take 1 mg of folate supplement per day if capable of pregnancy.   Sexuality: Discussed sexually transmitted diseases, partner selection, use of condoms, avoidance of unintended pregnancy  and contraceptive alternatives.   Advised to avoid cigarette smoking.  I discussed with the patient that most people either abstain from alcohol or drink within safe limits (<=14/week and <=4 drinks/occasion for males, <=7/weeks and <= 3 drinks/occasion for females) and that the risk for alcohol disorders and other health effects rises proportionally with the number of drinks per week and how often a drinker exceeds daily limits.  Discussed cessation/primary prevention of drug use and availability of treatment for abuse.   Diet: Encouraged to adjust caloric intake to maintain  or achieve ideal body weight, to reduce intake of dietary saturated fat and total fat, to limit sodium intake by avoiding high sodium foods and not adding table salt, and to maintain adequate dietary potassium and calcium preferably from fresh fruits, vegetables, and low-fat dairy products.    stressed the importance of regular exercise  Injury prevention: Discussed safety belts, safety helmets, smoke detector, smoking near bedding or upholstery.   Dental health: Discussed importance of regular  tooth brushing, flossing, and dental visits.    NEXT PREVENTATIVE PHYSICAL DUE IN 1 YEAR. Return in about 4 weeks (around 01/25/2020) for sleep f/u.

## 2019-12-29 ENCOUNTER — Other Ambulatory Visit: Payer: Self-pay | Admitting: Family Medicine

## 2019-12-29 ENCOUNTER — Encounter: Payer: Self-pay | Admitting: Family Medicine

## 2019-12-29 DIAGNOSIS — N393 Stress incontinence (female) (male): Secondary | ICD-10-CM | POA: Insufficient documentation

## 2019-12-29 DIAGNOSIS — G4700 Insomnia, unspecified: Secondary | ICD-10-CM | POA: Insufficient documentation

## 2019-12-29 LAB — CBC WITH DIFFERENTIAL/PLATELET
Basophils Absolute: 0 10*3/uL (ref 0.0–0.2)
Basos: 0 %
EOS (ABSOLUTE): 0 10*3/uL (ref 0.0–0.4)
Eos: 1 %
Hematocrit: 42 % (ref 34.0–46.6)
Hemoglobin: 14.2 g/dL (ref 11.1–15.9)
Immature Grans (Abs): 0 10*3/uL (ref 0.0–0.1)
Immature Granulocytes: 0 %
Lymphocytes Absolute: 2.7 10*3/uL (ref 0.7–3.1)
Lymphs: 46 %
MCH: 30.8 pg (ref 26.6–33.0)
MCHC: 33.8 g/dL (ref 31.5–35.7)
MCV: 91 fL (ref 79–97)
Monocytes Absolute: 0.4 10*3/uL (ref 0.1–0.9)
Monocytes: 7 %
Neutrophils Absolute: 2.6 10*3/uL (ref 1.4–7.0)
Neutrophils: 46 %
Platelets: 278 10*3/uL (ref 150–450)
RBC: 4.61 x10E6/uL (ref 3.77–5.28)
RDW: 13 % (ref 11.7–15.4)
WBC: 5.8 10*3/uL (ref 3.4–10.8)

## 2019-12-29 LAB — COMPREHENSIVE METABOLIC PANEL
ALT: 17 IU/L (ref 0–32)
AST: 18 IU/L (ref 0–40)
Albumin/Globulin Ratio: 1.8 (ref 1.2–2.2)
Albumin: 4.3 g/dL (ref 3.8–4.8)
Alkaline Phosphatase: 90 IU/L (ref 48–121)
BUN/Creatinine Ratio: 13 (ref 9–23)
BUN: 10 mg/dL (ref 6–20)
Bilirubin Total: 0.3 mg/dL (ref 0.0–1.2)
CO2: 22 mmol/L (ref 20–29)
Calcium: 9 mg/dL (ref 8.7–10.2)
Chloride: 104 mmol/L (ref 96–106)
Creatinine, Ser: 0.78 mg/dL (ref 0.57–1.00)
GFR calc Af Amer: 116 mL/min/{1.73_m2} (ref >59)
GFR calc non Af Amer: 100 mL/min/{1.73_m2} (ref >59)
Globulin, Total: 2.4 g/dL (ref 1.5–4.5)
Glucose: 78 mg/dL (ref 65–99)
Potassium: 4.2 mmol/L (ref 3.5–5.2)
Sodium: 139 mmol/L (ref 134–144)
Total Protein: 6.7 g/dL (ref 6.0–8.5)

## 2019-12-29 LAB — LIPID PANEL W/O CHOL/HDL RATIO
Cholesterol, Total: 162 mg/dL (ref 100–199)
HDL: 55 mg/dL (ref >39)
LDL Chol Calc (NIH): 100 mg/dL — ABNORMAL HIGH (ref 0–99)
Triglycerides: 29 mg/dL (ref 0–149)
VLDL Cholesterol Cal: 7 mg/dL (ref 5–40)

## 2019-12-29 LAB — VITAMIN B12: Vitamin B-12: 644 pg/mL (ref 232–1245)

## 2019-12-29 LAB — VITAMIN D 25 HYDROXY (VIT D DEFICIENCY, FRACTURES): Vit D, 25-Hydroxy: 13.4 ng/mL — ABNORMAL LOW (ref 30.0–100.0)

## 2019-12-29 LAB — TSH: TSH: 1.49 u[IU]/mL (ref 0.450–4.500)

## 2019-12-29 MED ORDER — BUPROPION HCL ER (XL) 300 MG PO TB24: 300.0000 mg | ORAL_TABLET | Freq: Every day | ORAL | 1 refills | Status: DC

## 2019-12-29 MED ORDER — METRONIDAZOLE 500 MG PO TABS: 500.0000 mg | ORAL_TABLET | Freq: Two times a day (BID) | ORAL | 0 refills | Status: DC

## 2019-12-29 MED ORDER — VITAMIN D (ERGOCALCIFEROL) 1.25 MG (50000 UNIT) PO CAPS: 50000.0000 [IU] | ORAL_CAPSULE | ORAL | 1 refills | Status: DC

## 2019-12-29 NOTE — Assessment & Plan Note (Signed)
Referral placed to Urology per her request. Continue pelvic floor exercises. U/A today

## 2019-12-29 NOTE — Telephone Encounter (Signed)
Patient seen yesterday.

## 2019-12-29 NOTE — Assessment & Plan Note (Signed)
Some breakthrough sxs, will start seroquel at bedtime to help with her extreme fatigue and insomnia and increase as needed. Continue wellbutrin. No longer following with Psychiatry

## 2019-12-29 NOTE — Assessment & Plan Note (Signed)
Trial seroquel at bedtime, increase prn. Sleep hygiene reviewed

## 2019-12-30 LAB — GC/CHLAMYDIA PROBE AMP
Chlamydia trachomatis, NAA: NEGATIVE
Neisseria Gonorrhoeae by PCR: NEGATIVE

## 2020-01-08 ENCOUNTER — Encounter: Payer: Self-pay | Admitting: Urology

## 2020-01-18 ENCOUNTER — Encounter: Payer: Self-pay | Admitting: Family Medicine

## 2020-01-18 ENCOUNTER — Telehealth (INDEPENDENT_AMBULATORY_CARE_PROVIDER_SITE_OTHER): Payer: No Typology Code available for payment source | Admitting: Family Medicine

## 2020-01-18 VITALS — BP 118/64 | Temp 97.8°F | Wt 203.5 lb

## 2020-01-18 DIAGNOSIS — F331 Major depressive disorder, recurrent, moderate: Secondary | ICD-10-CM

## 2020-01-18 DIAGNOSIS — F4310 Post-traumatic stress disorder, unspecified: Secondary | ICD-10-CM | POA: Diagnosis not present

## 2020-01-18 DIAGNOSIS — F419 Anxiety disorder, unspecified: Secondary | ICD-10-CM | POA: Diagnosis not present

## 2020-01-18 MED ORDER — ALPRAZOLAM 0.25 MG PO TABS: 0.2500 mg | ORAL_TABLET | Freq: Every day | ORAL | 0 refills | Status: DC | PRN

## 2020-01-18 MED ORDER — ARIPIPRAZOLE 2 MG PO TABS: 2.0000 mg | ORAL_TABLET | Freq: Every day | ORAL | 0 refills | Status: DC

## 2020-01-18 NOTE — Progress Notes (Signed)
BP (!) 118/64   Temp 97.8 F (36.6 C) (Temporal)   Wt (!) 203 lb 8 oz (92.3 kg)   LMP 11/22/2015 (Approximate)   BMI 37.22 kg/m    Subjective:    Patient ID: Laura [name], female    DOB: 1986/06/09, 34 y.o.   MRN: [id number]  HPI: Laura [name] is a 34 y.o. female  Chief Complaint  Patient presents with  . Anxiety    . This visit was completed via MyChart due to the restrictions of the COVID-19 pandemic. All issues as above were discussed and addressed. Physical exam was done as above through visual confirmation on MyChart. If it was felt that the patient should be evaluated in the office, they were directed there. The patient verbally consented to this visit. . Location of the patient: in parked car . Location of the provider: work . Those involved with this call:  . Provider: Merrie Roof, PA-C . CMA: Lesle Chris, [place] . Front Desk/Registration: Jill Side  . Time spent on call: 30 minutes with patient face to face via video conference. More than 50% of this time was spent in counseling and coordination of care. 5 minutes total spent in review of patient's record and preparation of their chart. I verified patient identity using two factors (patient name and date of birth). Patient consents verbally to being seen via telemedicine visit today.   Here today concerned about worsening anxiety, panic episodes, and mood lability that is affecting her family life as well as work life at this point. Did not do well on the seroquel, just caused her drowsiness in the mornings so stopped taking it. Still has the hydroxyzine at home from previous year or so but notes it's not helping on prn basis. Has tried numerous SSRIs, including buspar on prn basis and has not felt any of them were helpful. Does still take wellbutrin daily. Denies SI/HI. Used to see Psychiatry, ok with trying another clinic for a new opinion.   Depression screen [facility] 2/9 01/18/2020 12/28/2019 07/15/2017  Decreased  Interest 3 1 1   Down, Depressed, Hopeless 1 1 1   PHQ - 2 Score 4 2 2   Altered sleeping 3 3 3   Tired, decreased energy 3 3 3   Change in appetite 3 3 3   Feeling bad or failure about yourself  3 1 1   Trouble concentrating 1 3 0  Moving slowly or fidgety/restless 0 0 1  Suicidal thoughts 0 0 0  PHQ-9 Score 17 15 13   Difficult doing work/chores Extremely dIfficult - -    Relevant past medical, surgical, family and social history reviewed and updated as indicated. Interim medical history since our last visit reviewed. Allergies and medications reviewed and updated.  Review of Systems  Per HPI unless specifically indicated above     Objective:    BP (!) 118/64   Temp 97.8 F (36.6 C) (Temporal)   Wt (!) 203 lb 8 oz (92.3 kg)   LMP 11/22/2015 (Approximate)   BMI 37.22 kg/m   Wt Readings from Last 3 Encounters:  01/18/20 (!) 203 lb 8 oz (92.3 kg)  12/28/19 207 lb (93.9 kg)  09/15/18 218 lb 1.6 oz (98.9 kg)    Physical Exam Vitals and nursing note reviewed.  Constitutional:      General: She is not in acute distress.    Appearance: Normal appearance.  HENT:     Head: Atraumatic.     Right Ear: External ear normal.  Left Ear: External ear normal.     Nose: Nose normal. No congestion.     Mouth/Throat:     Mouth: Mucous membranes are moist.     Pharynx: Oropharynx is clear. No posterior oropharyngeal erythema.  Eyes:     Extraocular Movements: Extraocular movements intact.     Conjunctiva/sclera: Conjunctivae normal.  Cardiovascular:     Comments: Unable to assess via virtual visit Pulmonary:     Effort: Pulmonary effort is normal. No respiratory distress.  Musculoskeletal:        General: Normal range of motion.     Cervical back: Normal range of motion.  Skin:    General: Skin is dry.     Findings: No erythema.  Neurological:     Mental Status: She is alert and oriented to person, place, and time.  Psychiatric:        Thought Content: Thought content normal.         Judgment: Judgment normal.     Comments: Tearful, anxious     Results for orders placed or performed in visit on 12/28/19  WET PREP FOR [place], YEAST, CLUE   Specimen: Urine   Urine  Result Value Ref Range   Trichomonas Exam Negative Negative   Yeast Exam Negative Negative   Clue Cell Exam Positive (A) Negative  GC/Chlamydia Probe Amp   Specimen: Urine   UR  Result Value Ref Range   Chlamydia trachomatis, NAA Negative Negative   Neisseria Gonorrhoeae by PCR Negative Negative  CBC with Differential/Platelet  Result Value Ref Range   WBC 5.8 3.4 - 10.8 x10E3/uL   RBC 4.61 3.77 - 5.28 x10E6/uL   Hemoglobin 14.2 11.1 - 15.9 g/dL   Hematocrit 42.0 34.0 - 46.6 %   MCV 91 79 - 97 fL   MCH 30.8 26.6 - 33.0 pg   MCHC 33.8 31 - 35 g/dL   RDW 13.0 11.7 - 15.4 %   Platelets 278 150 - 450 x10E3/uL   Neutrophils 46 Not Estab. %   Lymphs 46 Not Estab. %   Monocytes 7 Not Estab. %   Eos 1 Not Estab. %   Basos 0 Not Estab. %   Neutrophils Absolute 2.6 1 - 7 x10E3/uL   Lymphocytes Absolute 2.7 0 - 3 x10E3/uL   Monocytes Absolute 0.4 0 - 0 x10E3/uL   EOS (ABSOLUTE) 0.0 0.0 - 0.4 x10E3/uL   Basophils Absolute 0.0 0 - 0 x10E3/uL   Immature Granulocytes 0 Not Estab. %   Immature Grans (Abs) 0.0 0.0 - 0.1 x10E3/uL  Comprehensive metabolic panel  Result Value Ref Range   Glucose 78 65 - 99 mg/dL   BUN 10 6 - 20 mg/dL   Creatinine, Ser 0.78 0.57 - 1.00 mg/dL   GFR calc non Af Amer 100 >59 mL/min/1.73   GFR calc Af Amer 116 >59 mL/min/1.73   BUN/Creatinine Ratio 13 9 - 23   Sodium 139 134 - 144 mmol/L   Potassium 4.2 3.5 - 5.2 mmol/L   Chloride 104 96 - 106 mmol/L   CO2 22 20 - 29 mmol/L   Calcium 9.0 8.7 - 10.2 mg/dL   Total Protein 6.7 6.0 - 8.5 g/dL   Albumin 4.3 3.8 - 4.8 g/dL   Globulin, Total 2.4 1.5 - 4.5 g/dL   Albumin/Globulin Ratio 1.8 1.2 - 2.2   Bilirubin Total 0.3 0.0 - 1.2 mg/dL   Alkaline Phosphatase 90 48 - 121 IU/L   AST 18 0 - 40 IU/L  ALT 17 0 - 32 IU/L    Lipid Panel w/o Chol/HDL Ratio  Result Value Ref Range   Cholesterol, Total 162 100 - 199 mg/dL   Triglycerides 29 0 - 149 mg/dL   HDL 55 >39 mg/dL   VLDL Cholesterol Cal 7 5 - 40 mg/dL   LDL Chol Calc (NIH) 100 (H) 0 - 99 mg/dL  TSH  Result Value Ref Range   TSH 1.490 0.450 - 4.500 uIU/mL  UA/M w/rflx Culture, Routine   Specimen: Urine   Urine  Result Value Ref Range   Specific Gravity, UA 1.020 1.005 - 1.030   pH, UA 7.5 5.0 - 7.5   Color, UA Yellow Yellow   Appearance Ur Clear Clear   Leukocytes,UA Negative Negative   Protein,UA Negative Negative/Trace   Glucose, UA Negative Negative   Ketones, UA Negative Negative   RBC, UA Negative Negative   Bilirubin, UA Negative Negative   Urobilinogen, Ur 0.2 0.2 - 1.0 mg/dL   Nitrite, UA Negative Negative  Vitamin B12  Result Value Ref Range   Vitamin B-12 644 232 - 1,245 pg/mL  VITAMIN D 25 Hydroxy (Vit-D Deficiency, Fractures)  Result Value Ref Range   Vit D, 25-Hydroxy 13.4 (L) 30.0 - 100.0 ng/mL      Assessment & Plan:   Problem List Items Addressed This Visit      Other   Depression    Significantly exacerbated, will add abilify in place of seroquel to help with her mood lability which seems to be causing her anxiety to worsen and re-add prn xanax (should be using less than 10 tabs per month). Refer back to Psychiatry      Relevant Medications   ALPRAZolam (XANAX) 0.25 MG tablet   Other Relevant Orders   Ambulatory referral to Psychiatry   Anxiety - Primary    Significantly exacerbated, will add abilify in place of seroquel to help with her mood lability which seems to be causing her anxiety to worsen and re-add prn xanax (should be using less than 10 tabs per month). Refer back to Psychiatry      Relevant Medications   ALPRAZolam (XANAX) 0.25 MG tablet   Other Relevant Orders   Ambulatory referral to Psychiatry    Other Visit Diagnoses    PTSD (post-traumatic stress disorder)       Relevant Medications    ALPRAZolam (XANAX) 0.25 MG tablet   Other Relevant Orders   Ambulatory referral to Psychiatry      30 minutes spent today in direct patient care and counseling  Follow up plan: Return in about 4 weeks (around 02/15/2020) for Mood, anxiety f/u.

## 2020-01-21 NOTE — Assessment & Plan Note (Signed)
Significantly exacerbated, will add abilify in place of seroquel to help with her mood lability which seems to be causing her anxiety to worsen and re-add prn xanax (should be using less than 10 tabs per month). Refer back to Psychiatry

## 2020-01-29 ENCOUNTER — Ambulatory Visit: Payer: No Typology Code available for payment source | Admitting: Family Medicine

## 2020-02-01 ENCOUNTER — Telehealth: Payer: No Typology Code available for payment source | Admitting: Family Medicine

## 2020-02-05 ENCOUNTER — Encounter: Payer: Self-pay | Admitting: Family Medicine

## 2020-02-09 ENCOUNTER — Other Ambulatory Visit: Payer: Self-pay | Admitting: Family Medicine

## 2020-02-09 MED ORDER — ALPRAZOLAM 0.25 MG PO TABS: 0.2500 mg | ORAL_TABLET | Freq: Every day | ORAL | 0 refills | Status: DC | PRN

## 2020-02-12 ENCOUNTER — Other Ambulatory Visit: Payer: Self-pay | Admitting: Family Medicine

## 2020-02-13 MED ORDER — ARIPIPRAZOLE 2 MG PO TABS: 2.0000 mg | ORAL_TABLET | Freq: Every day | ORAL | 0 refills | Status: DC

## 2020-02-13 NOTE — Telephone Encounter (Signed)
Last visit was 01/18/20 and has appointment 02/19/20

## 2020-02-16 ENCOUNTER — Other Ambulatory Visit: Payer: Self-pay | Admitting: Family Medicine

## 2020-02-16 MED ORDER — CYCLOBENZAPRINE HCL 10 MG PO TABS: 10.0000 mg | ORAL_TABLET | Freq: Three times a day (TID) | ORAL | 0 refills | Status: DC | PRN

## 2020-02-16 NOTE — Telephone Encounter (Signed)
Patient last seen 01/18/20

## 2020-02-19 ENCOUNTER — Ambulatory Visit (INDEPENDENT_AMBULATORY_CARE_PROVIDER_SITE_OTHER): Payer: No Typology Code available for payment source | Admitting: Family Medicine

## 2020-02-19 ENCOUNTER — Encounter: Payer: Self-pay | Admitting: Family Medicine

## 2020-02-19 ENCOUNTER — Other Ambulatory Visit: Payer: Self-pay

## 2020-02-19 VITALS — BP 116/79 | HR 111 | Temp 98.5°F | Wt 204.0 lb

## 2020-02-19 DIAGNOSIS — F419 Anxiety disorder, unspecified: Secondary | ICD-10-CM | POA: Diagnosis not present

## 2020-02-19 DIAGNOSIS — F331 Major depressive disorder, recurrent, moderate: Secondary | ICD-10-CM

## 2020-02-19 MED ORDER — ARIPIPRAZOLE 5 MG PO TABS: 5.0000 mg | ORAL_TABLET | Freq: Every day | ORAL | 0 refills | Status: DC

## 2020-02-19 NOTE — Progress Notes (Signed)
BP 116/79   Pulse (!) 111   Temp 98.5 F (36.9 C) (Oral)   Wt 204 lb (92.5 kg)   LMP 11/22/2015 (Approximate)   SpO2 99%   BMI 37.31 kg/m    Subjective:    Patient ID: Laura [name], female    DOB: 1985/07/03, 34 y.o.   MRN: [id number]  HPI: Laura [name] is a 34 y.o. female  Chief Complaint  Patient presents with  . Anxiety  . Depression   Here today for 1 month mood and anxiety f/u after starting abilify in place of seroquel. Feels a huge benefit since starting this, moods more balanced, less agitation, less crying spells. Still taking wellbutrin daily. Taking the xanax about 1-2 times per week as needed for panic attacks. Waiting to get in with Psychiatry still. Denies si/hi.   Depression screen [facility] 2/9 02/19/2020 01/18/2020 12/28/2019  Decreased Interest 1 3 1   Down, Depressed, Hopeless 1 1 1   PHQ - 2 Score 2 4 2   Altered sleeping 3 3 3   Tired, decreased energy 3 3 3   Change in appetite 0 3 3  Feeling bad or failure about yourself  2 3 1   Trouble concentrating 3 1 3   Moving slowly or fidgety/restless 1 0 0  Suicidal thoughts 0 0 0  PHQ-9 Score 14 17 15   Difficult doing work/chores - Extremely dIfficult -   GAD 7 : Generalized Anxiety Score 02/19/2020 01/18/2020 12/28/2019 03/31/2017  Nervous, Anxious, on Edge 3 3 2 3   Control/stop worrying 3 3 3 2   Worry too much - different things 3 3 3 3   Trouble relaxing 3 3 3 2   Restless 0 1 0 1  Easily annoyed or irritable 3 3 3 3   Afraid - awful might happen 0 1 0 1  Total GAD 7 Score 15 17 14 15   Anxiety Difficulty Very difficult Extremely difficult Somewhat difficult Somewhat difficult     Relevant past medical, surgical, family and social history reviewed and updated as indicated. Interim medical history since our last visit reviewed. Allergies and medications reviewed and updated.  Review of Systems  Per HPI unless specifically indicated above     Objective:    BP 116/79   Pulse (!) 111   Temp 98.5 F  (36.9 C) (Oral)   Wt 204 lb (92.5 kg)   LMP 11/22/2015 (Approximate)   SpO2 99%   BMI 37.31 kg/m   Wt Readings from Last 3 Encounters:  02/19/20 204 lb (92.5 kg)  01/18/20 (!) 203 lb 8 oz (92.3 kg)  12/28/19 207 lb (93.9 kg)    Physical Exam Vitals and nursing note reviewed.  Constitutional:      Appearance: Normal appearance. She is not ill-appearing.  HENT:     Head: Atraumatic.  Eyes:     Extraocular Movements: Extraocular movements intact.     Conjunctiva/sclera: Conjunctivae normal.  Cardiovascular:     Rate and Rhythm: Normal rate and regular rhythm.     Heart sounds: Normal heart sounds.  Pulmonary:     Effort: Pulmonary effort is normal.     Breath sounds: Normal breath sounds.  Musculoskeletal:        General: Normal range of motion.     Cervical back: Normal range of motion and neck supple.  Skin:    General: Skin is warm and dry.  Neurological:     Mental Status: She is alert and oriented to person, place, and time.  Psychiatric:  Mood and Affect: Mood normal.        Thought Content: Thought content normal.        Judgment: Judgment normal.     Results for orders placed or performed in visit on 12/28/19  WET PREP FOR [place], YEAST, CLUE   Specimen: Urine   Urine  Result Value Ref Range   Trichomonas Exam Negative Negative   Yeast Exam Negative Negative   Clue Cell Exam Positive (A) Negative  GC/Chlamydia Probe Amp   Specimen: Urine   UR  Result Value Ref Range   Chlamydia trachomatis, NAA Negative Negative   Neisseria Gonorrhoeae by PCR Negative Negative  CBC with Differential/Platelet  Result Value Ref Range   WBC 5.8 3.4 - 10.8 x10E3/uL   RBC 4.61 3.77 - 5.28 x10E6/uL   Hemoglobin 14.2 11.1 - 15.9 g/dL   Hematocrit 42.0 34.0 - 46.6 %   MCV 91 79 - 97 fL   MCH 30.8 26.6 - 33.0 pg   MCHC 33.8 31 - 35 g/dL   RDW 13.0 11.7 - 15.4 %   Platelets 278 150 - 450 x10E3/uL   Neutrophils 46 Not Estab. %   Lymphs 46 Not Estab. %   Monocytes 7  Not Estab. %   Eos 1 Not Estab. %   Basos 0 Not Estab. %   Neutrophils Absolute 2.6 1 - 7 x10E3/uL   Lymphocytes Absolute 2.7 0 - 3 x10E3/uL   Monocytes Absolute 0.4 0 - 0 x10E3/uL   EOS (ABSOLUTE) 0.0 0.0 - 0.4 x10E3/uL   Basophils Absolute 0.0 0 - 0 x10E3/uL   Immature Granulocytes 0 Not Estab. %   Immature Grans (Abs) 0.0 0.0 - 0.1 x10E3/uL  Comprehensive metabolic panel  Result Value Ref Range   Glucose 78 65 - 99 mg/dL   BUN 10 6 - 20 mg/dL   Creatinine, Ser 0.78 0.57 - 1.00 mg/dL   GFR calc non Af Amer 100 >59 mL/min/1.73   GFR calc Af Amer 116 >59 mL/min/1.73   BUN/Creatinine Ratio 13 9 - 23   Sodium 139 134 - 144 mmol/L   Potassium 4.2 3.5 - 5.2 mmol/L   Chloride 104 96 - 106 mmol/L   CO2 22 20 - 29 mmol/L   Calcium 9.0 8.7 - 10.2 mg/dL   Total Protein 6.7 6.0 - 8.5 g/dL   Albumin 4.3 3.8 - 4.8 g/dL   Globulin, Total 2.4 1.5 - 4.5 g/dL   Albumin/Globulin Ratio 1.8 1.2 - 2.2   Bilirubin Total 0.3 0.0 - 1.2 mg/dL   Alkaline Phosphatase 90 48 - 121 IU/L   AST 18 0 - 40 IU/L   ALT 17 0 - 32 IU/L  Lipid Panel w/o Chol/HDL Ratio  Result Value Ref Range   Cholesterol, Total 162 100 - 199 mg/dL   Triglycerides 29 0 - 149 mg/dL   HDL 55 >39 mg/dL   VLDL Cholesterol Cal 7 5 - 40 mg/dL   LDL Chol Calc (NIH) 100 (H) 0 - 99 mg/dL  TSH  Result Value Ref Range   TSH 1.490 0.450 - 4.500 uIU/mL  UA/M w/rflx Culture, Routine   Specimen: Urine   Urine  Result Value Ref Range   Specific Gravity, UA 1.020 1.005 - 1.030   pH, UA 7.5 5.0 - 7.5   Color, UA Yellow Yellow   Appearance Ur Clear Clear   Leukocytes,UA Negative Negative   Protein,UA Negative Negative/Trace   Glucose, UA Negative Negative   Ketones, UA Negative Negative  RBC, UA Negative Negative   Bilirubin, UA Negative Negative   Urobilinogen, Ur 0.2 0.2 - 1.0 mg/dL   Nitrite, UA Negative Negative  Vitamin B12  Result Value Ref Range   Vitamin B-12 644 232 - 1,245 pg/mL  VITAMIN D 25 Hydroxy (Vit-D Deficiency,  Fractures)  Result Value Ref Range   Vit D, 25-Hydroxy 13.4 (L) 30.0 - 100.0 ng/mL      Assessment & Plan:   Problem List Items Addressed This Visit      Other   Depression - Primary    Excellent benefit with abilify, increase to 5 mg dose and continue wellbutrin. F/u in 6 weeks either with Psychiatry or here with PCP      Anxiety    Stable on wellbutrin, abilify and rare prn xanax. Goal of script lasting at least 3 months. Awaiting est care with Psychiatry          Follow up plan: Return in about 6 weeks (around 04/01/2020) for Mood f/u.

## 2020-02-19 NOTE — Assessment & Plan Note (Signed)
Excellent benefit with abilify, increase to 5 mg dose and continue wellbutrin. F/u in 6 weeks either with Psychiatry or here with PCP

## 2020-02-19 NOTE — Assessment & Plan Note (Signed)
Stable on wellbutrin, abilify and rare prn xanax. Goal of script lasting at least 3 months. Awaiting est care with Psychiatry

## 2020-03-08 ENCOUNTER — Other Ambulatory Visit: Payer: Self-pay | Admitting: Nurse Practitioner

## 2020-03-08 ENCOUNTER — Encounter: Payer: Self-pay | Admitting: Nurse Practitioner

## 2020-03-08 ENCOUNTER — Other Ambulatory Visit: Payer: Self-pay

## 2020-03-08 ENCOUNTER — Telehealth (INDEPENDENT_AMBULATORY_CARE_PROVIDER_SITE_OTHER): Payer: No Typology Code available for payment source | Admitting: Nurse Practitioner

## 2020-03-08 VITALS — Temp 97.3°F | Wt 206.4 lb

## 2020-03-08 DIAGNOSIS — E669 Obesity, unspecified: Secondary | ICD-10-CM | POA: Diagnosis not present

## 2020-03-08 MED ORDER — SAXENDA 18 MG/3ML ~~LOC~~ SOPN: PEN_INJECTOR | SUBCUTANEOUS | 2 refills | Status: DC

## 2020-03-08 NOTE — Progress Notes (Signed)
Temp (!) 97.3 F (36.3 C) (Oral)   Wt 206 lb 6.4 oz (93.6 kg)   LMP 11/22/2015 (Approximate)   BMI 37.75 kg/m    Subjective:    Patient ID: Laura [name], female    DOB: 05/06/1986, 34 y.o.   MRN: [id number]  HPI: Laura [name] is a 34 y.o. female presenting for weight management.   Chief Complaint  Patient presents with  . Weight Gain    pt states she wants to discuss starting medication for weight loss, states she was on phentermine, keto diet and bariatric clinic at the beginning of this year. States she lost about 40 pounds between January and July. States she has been gaining weight since then.    WEIGHT Patients reports having issues with weight going up and down all of her life.  Earlier this year, she attended the bariatric surgery clinic and lost about 40 pounds.  She was also on a very strict keto diet and taking phentermine and Vitamin B12 injections at the time. She was also exercising 5-6 times per week.     She is now exercising 2-3 times per week.  She has not taken phentermine and b12 since late May or June.  Currently she is still doing the keto diet, low carb version.  She is eating 3 meals per day and is still very hungry.  Duration: ongoing Previous attempts at weight loss: yes Complications of obesity:  Peak weight: 243 Weight loss goal: 180 Weight loss to date: 40  Requesting obesity pharmacotherapy: yes Current weight loss supplements/medications: no Previous weight loss supplements/meds: yes - previously on phentermine and vitamin B 12  Allergies  Allergen Reactions  . Amoxicillin Rash and Other (See Comments)    GI upset   Outpatient Encounter Medications as of 03/08/2020  Medication Sig  . ALPRAZolam (XANAX) 0.25 MG tablet Take 1 tablet (0.25 mg total) by mouth daily as needed for anxiety.  . ARIPiprazole (ABILIFY) 2 MG tablet Take 2 mg by mouth daily.  Marland Kitchen buPROPion (WELLBUTRIN XL) 300 MG 24 hr tablet Take 1 tablet (300 mg total) by mouth  daily.  . cyclobenzaprine (FLEXERIL) 10 MG tablet Take 1 tablet (10 mg total) by mouth 3 (three) times daily as needed for muscle spasms.  . Vitamin D, Ergocalciferol, (DRISDOL) 1.25 MG (50000 UNIT) CAPS capsule Take 1 capsule (50,000 Units total) by mouth every 7 (seven) days.  . ARIPiprazole (ABILIFY) 5 MG tablet Take 1 tablet (5 mg total) by mouth daily. (Patient not taking: Reported on 03/08/2020)  . Liraglutide -Weight Management (SAXENDA) 18 MG/3ML SOPN Inject 0.6 mg into the skin daily for 7 days, THEN 1.2 mg daily for 7 days, THEN 1.8 mg daily for 7 days, THEN 2.4 mg daily for 7 days, THEN 3 mg daily.   Facility-Administered Encounter Medications as of 03/08/2020  Medication  . cyanocobalamin ((VITAMIN B-12)) injection 1,000 mcg   Patient Active Problem List   Diagnosis Date Noted  . Insomnia 12/29/2019  . Stress incontinence 12/29/2019  . Cigarette smoker 08/18/2018  . Decreased libido 10/28/2016  . Obesity (BMI 30-39.9) 10/28/2016  . Hot flashes 04/22/2016  . Status post laparoscopic assisted vaginal hysterectomy (LAVH) 12/25/2015  . Fibroids 12/16/2015  . History of cesarean section 11/06/2015  . Post traumatic stress disorder 03/13/2015  . Depression 03/13/2015  . Anxiety 03/13/2015   Past Medical History:  Diagnosis Date  . Anxiety   . Bipolar disorder ([place])    past hx of  .  Complication of anesthesia    "woke up during surgery"  . Depression   . Heavy periods   . Incontinence of urine   . Increased BMI   . Indigestion   . PTSD (post-traumatic stress disorder)   . Tobacco user    Relevant past medical, surgical, family and social history reviewed and updated as indicated. Interim medical history since our last visit reviewed.  Review of Systems  Constitutional: Negative.   Skin: Negative.   Neurological: Negative.   Psychiatric/Behavioral: Negative.     Per HPI unless specifically indicated above     Objective:    Temp (!) 97.3 F (36.3 C) (Oral)    Wt 206 lb 6.4 oz (93.6 kg)   LMP 11/22/2015 (Approximate)   BMI 37.75 kg/m   Wt Readings from Last 3 Encounters:  03/08/20 206 lb 6.4 oz (93.6 kg)  02/19/20 204 lb (92.5 kg)  01/18/20 (!) 203 lb 8 oz (92.3 kg)    Physical Exam Vitals and nursing note reviewed.  Constitutional:      General: She is not in acute distress.    Appearance: Normal appearance. She is not toxic-appearing.  Skin:    Coloration: Skin is not jaundiced or pale.     Findings: No erythema.  Neurological:     General: No focal deficit present.     Mental Status: She is alert and oriented to person, place, and time.     Motor: No weakness.     Gait: Gait normal.  Psychiatric:        Mood and Affect: Mood normal.        Behavior: Behavior normal.        Thought Content: Thought content normal.        Judgment: Judgment normal.       Assessment & Plan:   Problem List Items Addressed This Visit      Other   Obesity (BMI 30-39.9) - Primary    Ongoing.  Patient desires to lose 30 more pounds and seems to have hit wall of weight loss.  Will trial Saxenda for weight loss and follow up in 4 weeks.       Relevant Medications   Liraglutide -Weight Management (SAXENDA) 18 MG/3ML SOPN       Follow up plan: Return in about 4 weeks (around 04/05/2020) for weight follow up.  Due to the catastrophic nature of the COVID-19 pandemic, this visit was completed via audio and visual contact via Mychart due to the restrictions of the COVID-19 pandemic. All issues as above were discussed and addressed. Physical exam was done as above through visual confirmation on Mychart. If it was felt that the patient should be evaluated in the office, they were directed there. The patient verbally consented to this visit."} . Location of the patient: parking lot . Location of the provider: work . Those involved with this call:  . Provider: Carnella Gu[place], DNP . CMA: Yvonna Alanis, CMA . Front Desk/Registration: Don Perking    . Time spent on call: 15 minutes with patient face to face via video conference. More than 50% of this time was spent in counseling and coordination of care. 30 minutes total spent in review of patient's record and preparation of their chart.  I verified patient identity using two factors (patient name and date of birth). Patient consents verbally to being seen via telemedicine visit today.

## 2020-03-08 NOTE — Patient Instructions (Signed)
Liraglutide injection (Weight Management) What is this medicine? LIRAGLUTIDE (LIR a GLOO tide) is used to help people lose weight and maintain weight loss. It is used with a reduced-calorie diet and exercise. This medicine may be used for other purposes; ask your health care provider or pharmacist if you have questions. COMMON BRAND NAME(S): Saxenda What should I tell my health care provider before I take this medicine? They need to know if you have any of these conditions:  endocrine tumors (MEN 2) or if someone in your family had these tumors  gallbladder disease  high cholesterol  history of alcohol abuse problem  history of pancreatitis  kidney disease or if you are on dialysis  liver disease  previous swelling of the tongue, face, or lips with difficulty breathing, difficulty swallowing, hoarseness, or tightening of the throat  stomach problems  suicidal thoughts, plans, or attempt; a previous suicide attempt by you or a family member  thyroid cancer or if someone in your family had thyroid cancer  an unusual or allergic reaction to liraglutide, other medicines, foods, dyes, or preservatives  pregnant or trying to get pregnant  breast-feeding How should I use this medicine? This medicine is for injection under the skin of your upper leg, stomach area, or upper arm. You will be taught how to prepare and give this medicine. Use exactly as directed. Take your medicine at regular intervals. Do not take it more often than directed. This drug comes with INSTRUCTIONS FOR USE. Ask your pharmacist for directions on how to use this drug. Read the information carefully. Talk to your pharmacist or health care provider if you have questions. It is important that you put your used needles and syringes in a special sharps container. Do not put them in a trash can. If you do not have a sharps container, call your pharmacist or healthcare provider to get one. A special MedGuide will be  given to you by the pharmacist with each prescription and refill. Be sure to read this information carefully each time. Talk to your pediatrician regarding the use of this medicine in children. Special care may be needed. Overdosage: If you think you have taken too much of this medicine contact a poison control center or emergency room at once. NOTE: This medicine is only for you. Do not share this medicine with others. What if I miss a dose? If you miss a dose, take it as soon as you can. If it is almost time for your next dose, take only that dose. Do not take double or extra doses. If you miss your dose for 3 days or more, call your doctor or health care professional to talk about how to restart this medicine. What may interact with this medicine?  insulin and other medicines for diabetes This list may not describe all possible interactions. Give your health care provider a list of all the medicines, herbs, non-prescription drugs, or dietary supplements you use. Also tell them if you smoke, drink alcohol, or use illegal drugs. Some items may interact with your medicine. What should I watch for while using this medicine? Visit your doctor or health care professional for regular checks on your progress. Drink plenty of fluids while taking this medicine. Check with your doctor or health care professional if you get an attack of severe diarrhea, nausea, and vomiting. The loss of too much body fluid can make it dangerous for you to take this medicine. This medicine may affect blood sugar levels. Ask your healthcare   provider if changes in diet or medicines are needed if you have diabetes. Patients and their families should watch out for worsening depression or thoughts of suicide. Also watch out for sudden changes in feelings such as feeling anxious, agitated, panicky, irritable, hostile, aggressive, impulsive, severely restless, overly excited and hyperactive, or not being able to sleep. If this happens,  especially at the beginning of treatment or after a change in dose, call your health care professional. Women should inform their health care provider if they wish to become pregnant or think they might be pregnant. Losing weight while pregnant is not advised and may cause harm to the unborn child. Talk to your health care provider for more information. What side effects may I notice from receiving this medicine? Side effects that you should report to your doctor or health care professional as soon as possible:  allergic reactions like skin rash, itching or hives, swelling of the face, lips, or tongue  breathing problems  diarrhea that continues or is severe  lump or swelling on the neck  severe nausea  signs and symptoms of infection like fever or chills; cough; sore throat; pain or trouble passing urine  signs and symptoms of low blood sugar such as feeling anxious; confusion; dizziness; increased hunger; unusually weak or tired; increased sweating; shakiness; cold, clammy skin; irritable; headache; blurred vision; fast heartbeat; loss of consciousness  signs and symptoms of kidney injury like trouble passing urine or change in the amount of urine  trouble swallowing  unusual stomach upset or pain  vomiting Side effects that usually do not require medical attention (report to your doctor or health care professional if they continue or are bothersome):  constipation  decreased appetite  diarrhea  fatigue  headache  nausea  pain, redness, or irritation at site where injected  stomach upset  stuffy or runny nose This list may not describe all possible side effects. Call your doctor for medical advice about side effects. You may report side effects to FDA at 1-800-FDA-1088. Where should I keep my medicine? Keep out of the reach of children. Store unopened pen in a refrigerator between 2 and 8 degrees C (36 and 46 degrees F). Do not freeze or use if the medicine has been  frozen. Protect from light and excessive heat. After you first use the pen, it can be stored at room temperature between 15 and 30 degrees C (59 and 86 degrees F) or in a refrigerator. Throw away your used pen after 30 days or after the expiration date, whichever comes first. Do not store your pen with the needle attached. If the needle is left on, medicine may leak from the pen. NOTE: This sheet is a summary. It may not cover all possible information. If you have questions about this medicine, talk to your doctor, pharmacist, or health care provider.  2020 Elsevier/Gold Standard (2019-04-20 21:16:59)  

## 2020-03-08 NOTE — Assessment & Plan Note (Signed)
Ongoing.  Patient desires to lose 30 more pounds and seems to have hit wall of weight loss.  Will trial Saxenda for weight loss and follow up in 4 weeks.

## 2020-03-11 ENCOUNTER — Telehealth: Payer: Self-pay

## 2020-03-11 NOTE — Telephone Encounter (Signed)
PA for Saxenda initiated and submitted via Cover My Meds. Key: [id number].

## 2020-03-12 NOTE — Telephone Encounter (Signed)
PA approved. Patient notified via mychart.

## 2020-04-08 ENCOUNTER — Ambulatory Visit (INDEPENDENT_AMBULATORY_CARE_PROVIDER_SITE_OTHER): Payer: No Typology Code available for payment source | Admitting: Nurse Practitioner

## 2020-04-08 ENCOUNTER — Encounter: Payer: Self-pay | Admitting: Nurse Practitioner

## 2020-04-08 ENCOUNTER — Other Ambulatory Visit: Payer: Self-pay

## 2020-04-08 VITALS — BP 112/77 | HR 103 | Temp 98.0°F | Wt 208.2 lb

## 2020-04-08 DIAGNOSIS — Z6838 Body mass index (BMI) 38.0-38.9, adult: Secondary | ICD-10-CM

## 2020-04-08 DIAGNOSIS — E669 Obesity, unspecified: Secondary | ICD-10-CM

## 2020-04-08 NOTE — Assessment & Plan Note (Signed)
With good response to Saxenda so far, going to be increasing dose to 3 mg this week.  Will continue Saxenda for now and follow up in 2 months to monitor response.  Encouraged continued physical activity and diet.

## 2020-04-08 NOTE — Progress Notes (Signed)
BP 112/77   Pulse (!) 103   Temp 98 F (36.7 C) (Oral)   Wt 208 lb 3.2 oz (94.4 kg)   LMP 11/22/2015 (Approximate)   SpO2 98%   BMI 38.08 kg/m    Subjective:    Patient ID: Laura [name], female    DOB: July 17, 1985, 34 y.o.   MRN: [id number]  HPI: Laura [name] is a 34 y.o. female presenting for follow up.  Chief Complaint  Patient presents with  . Weight Loss    4 week f/up   WEIGHT LOSS Duration: months Previous attempts at weight loss: yes Complications of obesity:  Peak weight: 230 Weight loss goal: 180s Weight loss to date:  22 lbs Requesting obesity pharmacotherapy: yes Current weight loss supplements/medications: no Previous weight loss supplements/meds: yes  Allergies  Allergen Reactions  . Amoxicillin Rash and Other (See Comments)    GI upset   Outpatient Encounter Medications as of 04/08/2020  Medication Sig  . buPROPion (WELLBUTRIN XL) 300 MG 24 hr tablet Take 1 tablet (300 mg total) by mouth daily.  . cyclobenzaprine (FLEXERIL) 10 MG tablet Take 1 tablet (10 mg total) by mouth 3 (three) times daily as needed for muscle spasms.  . Liraglutide -Weight Management (SAXENDA) 18 MG/3ML SOPN Inject 0.6 mg into the skin daily for 7 days, THEN 1.2 mg daily for 7 days, THEN 1.8 mg daily for 7 days, THEN 2.4 mg daily for 7 days, THEN 3 mg daily.  Marland Kitchen UNIFINE PENTIPS 32G X 4 MM MISC   . Vitamin D, Ergocalciferol, (DRISDOL) 1.25 MG (50000 UNIT) CAPS capsule Take 1 capsule (50,000 Units total) by mouth every 7 (seven) days.  . ALPRAZolam (XANAX) 0.25 MG tablet Take 1 tablet (0.25 mg total) by mouth daily as needed for anxiety. (Patient not taking: Reported on 04/08/2020)  . [DISCONTINUED] ARIPiprazole (ABILIFY) 2 MG tablet Take 2 mg by mouth daily. (Patient not taking: Reported on 04/08/2020)  . [DISCONTINUED] ARIPiprazole (ABILIFY) 5 MG tablet Take 1 tablet (5 mg total) by mouth daily. (Patient not taking: Reported on 03/08/2020)   Facility-Administered  Encounter Medications as of 04/08/2020  Medication  . cyanocobalamin ((VITAMIN B-12)) injection 1,000 mcg   Patient Active Problem List   Diagnosis Date Noted  . Insomnia 12/29/2019  . Stress incontinence 12/29/2019  . Cigarette smoker 08/18/2018  . Decreased libido 10/28/2016  . Obesity (BMI 30-39.9) 10/28/2016  . Hot flashes 04/22/2016  . Status post laparoscopic assisted vaginal hysterectomy (LAVH) 12/25/2015  . Fibroids 12/16/2015  . History of cesarean section 11/06/2015  . Post traumatic stress disorder 03/13/2015  . Depression 03/13/2015  . Anxiety 03/13/2015   Past Medical History:  Diagnosis Date  . Anxiety   . Bipolar disorder ([place])    past hx of  . Complication of anesthesia    "woke up during surgery"  . Depression   . Heavy periods   . Incontinence of urine   . Increased BMI   . Indigestion   . PTSD (post-traumatic stress disorder)   . Tobacco user    Relevant past medical, surgical, family and social history reviewed and updated as indicated. Interim medical history since our last visit reviewed.  Review of Systems  Constitutional: Negative.   Respiratory: Negative.   Cardiovascular: Negative.   Gastrointestinal: Negative.   Skin: Negative.   Neurological: Negative.   Psychiatric/Behavioral: Negative.     Per HPI unless specifically indicated above     Objective:    BP 112/77  Pulse (!) 103   Temp 98 F (36.7 C) (Oral)   Wt 208 lb 3.2 oz (94.4 kg)   LMP 11/22/2015 (Approximate)   SpO2 98%   BMI 38.08 kg/m   Wt Readings from Last 3 Encounters:  04/08/20 208 lb 3.2 oz (94.4 kg)  03/08/20 206 lb 6.4 oz (93.6 kg)  02/19/20 204 lb (92.5 kg)    Physical Exam Vitals and nursing note reviewed.  Constitutional:      General: She is not in acute distress.    Appearance: Normal appearance. She is not toxic-appearing.  Eyes:     General: No scleral icterus.    Extraocular Movements: Extraocular movements intact.  Cardiovascular:     Rate  and Rhythm: Tachycardia present.  Pulmonary:     Effort: Pulmonary effort is normal. No respiratory distress.  Abdominal:     General: Abdomen is flat. There is no distension.  Musculoskeletal:        General: Normal range of motion.  Skin:    General: Skin is warm and dry.     Coloration: Skin is not jaundiced or pale.  Neurological:     General: No focal deficit present.     Mental Status: She is alert and oriented to person, place, and time.  Psychiatric:        Mood and Affect: Mood normal.        Behavior: Behavior normal.        Thought Content: Thought content normal.        Judgment: Judgment normal.        Assessment & Plan:   Problem List Items Addressed This Visit      Other   Obesity (BMI 30-39.9)    With good response to Saxenda so far, going to be increasing dose to 3 mg this week.  Will continue Saxenda for now and follow up in 2 months to monitor response.  Encouraged continued physical activity and diet.       Other Visit Diagnoses    BMI 38.0-38.9,adult    -  Primary       Follow up plan: Return in about 2 months (around 06/08/2020).

## 2020-04-08 NOTE — Patient Instructions (Signed)
Liraglutide injection (Weight Management) What is this medicine? LIRAGLUTIDE (LIR a GLOO tide) is used to help people lose weight and maintain weight loss. It is used with a reduced-calorie diet and exercise. This medicine may be used for other purposes; ask your health care provider or pharmacist if you have questions. COMMON BRAND NAME(S): Saxenda What should I tell my health care provider before I take this medicine? They need to know if you have any of these conditions:  endocrine tumors (MEN 2) or if someone in your family had these tumors  gallbladder disease  high cholesterol  history of alcohol abuse problem  history of pancreatitis  kidney disease or if you are on dialysis  liver disease  previous swelling of the tongue, face, or lips with difficulty breathing, difficulty swallowing, hoarseness, or tightening of the throat  stomach problems  suicidal thoughts, plans, or attempt; a previous suicide attempt by you or a family member  thyroid cancer or if someone in your family had thyroid cancer  an unusual or allergic reaction to liraglutide, other medicines, foods, dyes, or preservatives  pregnant or trying to get pregnant  breast-feeding How should I use this medicine? This medicine is for injection under the skin of your upper leg, stomach area, or upper arm. You will be taught how to prepare and give this medicine. Use exactly as directed. Take your medicine at regular intervals. Do not take it more often than directed. This drug comes with INSTRUCTIONS FOR USE. Ask your pharmacist for directions on how to use this drug. Read the information carefully. Talk to your pharmacist or health care provider if you have questions. It is important that you put your used needles and syringes in a special sharps container. Do not put them in a trash can. If you do not have a sharps container, call your pharmacist or healthcare provider to get one. A special MedGuide will be  given to you by the pharmacist with each prescription and refill. Be sure to read this information carefully each time. Talk to your pediatrician regarding the use of this medicine in children. Special care may be needed. Overdosage: If you think you have taken too much of this medicine contact a poison control center or emergency room at once. NOTE: This medicine is only for you. Do not share this medicine with others. What if I miss a dose? If you miss a dose, take it as soon as you can. If it is almost time for your next dose, take only that dose. Do not take double or extra doses. If you miss your dose for 3 days or more, call your doctor or health care professional to talk about how to restart this medicine. What may interact with this medicine?  insulin and other medicines for diabetes This list may not describe all possible interactions. Give your health care provider a list of all the medicines, herbs, non-prescription drugs, or dietary supplements you use. Also tell them if you smoke, drink alcohol, or use illegal drugs. Some items may interact with your medicine. What should I watch for while using this medicine? Visit your doctor or health care professional for regular checks on your progress. Drink plenty of fluids while taking this medicine. Check with your doctor or health care professional if you get an attack of severe diarrhea, nausea, and vomiting. The loss of too much body fluid can make it dangerous for you to take this medicine. This medicine may affect blood sugar levels. Ask your healthcare   provider if changes in diet or medicines are needed if you have diabetes. Patients and their families should watch out for worsening depression or thoughts of suicide. Also watch out for sudden changes in feelings such as feeling anxious, agitated, panicky, irritable, hostile, aggressive, impulsive, severely restless, overly excited and hyperactive, or not being able to sleep. If this happens,  especially at the beginning of treatment or after a change in dose, call your health care professional. Women should inform their health care provider if they wish to become pregnant or think they might be pregnant. Losing weight while pregnant is not advised and may cause harm to the unborn child. Talk to your health care provider for more information. What side effects may I notice from receiving this medicine? Side effects that you should report to your doctor or health care professional as soon as possible:  allergic reactions like skin rash, itching or hives, swelling of the face, lips, or tongue  breathing problems  diarrhea that continues or is severe  lump or swelling on the neck  severe nausea  signs and symptoms of infection like fever or chills; cough; sore throat; pain or trouble passing urine  signs and symptoms of low blood sugar such as feeling anxious; confusion; dizziness; increased hunger; unusually weak or tired; increased sweating; shakiness; cold, clammy skin; irritable; headache; blurred vision; fast heartbeat; loss of consciousness  signs and symptoms of kidney injury like trouble passing urine or change in the amount of urine  trouble swallowing  unusual stomach upset or pain  vomiting Side effects that usually do not require medical attention (report to your doctor or health care professional if they continue or are bothersome):  constipation  decreased appetite  diarrhea  fatigue  headache  nausea  pain, redness, or irritation at site where injected  stomach upset  stuffy or runny nose This list may not describe all possible side effects. Call your doctor for medical advice about side effects. You may report side effects to FDA at 1-800-FDA-1088. Where should I keep my medicine? Keep out of the reach of children. Store unopened pen in a refrigerator between 2 and 8 degrees C (36 and 46 degrees F). Do not freeze or use if the medicine has been  frozen. Protect from light and excessive heat. After you first use the pen, it can be stored at room temperature between 15 and 30 degrees C (59 and 86 degrees F) or in a refrigerator. Throw away your used pen after 30 days or after the expiration date, whichever comes first. Do not store your pen with the needle attached. If the needle is left on, medicine may leak from the pen. NOTE: This sheet is a summary. It may not cover all possible information. If you have questions about this medicine, talk to your doctor, pharmacist, or health care provider.  2020 Elsevier/Gold Standard (2019-04-20 21:16:59)  

## 2020-05-02 ENCOUNTER — Encounter: Payer: Self-pay | Admitting: Nurse Practitioner

## 2020-05-02 NOTE — Telephone Encounter (Signed)
Please Advise.  KP

## 2020-05-14 ENCOUNTER — Telehealth (INDEPENDENT_AMBULATORY_CARE_PROVIDER_SITE_OTHER): Payer: No Typology Code available for payment source | Admitting: Nurse Practitioner

## 2020-05-14 ENCOUNTER — Encounter: Payer: Self-pay | Admitting: Nurse Practitioner

## 2020-05-14 DIAGNOSIS — M791 Myalgia, unspecified site: Secondary | ICD-10-CM | POA: Insufficient documentation

## 2020-05-14 MED ORDER — PREDNISONE 20 MG PO TABS: 40.0000 mg | ORAL_TABLET | Freq: Every day | ORAL | 0 refills | Status: AC

## 2020-05-14 NOTE — Patient Instructions (Signed)

## 2020-05-14 NOTE — Assessment & Plan Note (Signed)
Acute with headache, fatigue, and sinus symptoms.  Have recommended retesting for Covid now that symptoms are present, she will reach out to workplace to schedule and alert provider of results.  Recommend she self quarantine until testing returned and symptoms improved.  Will send in script for Prednisone 40 MG x 5 days.  Recommend: - Increased rest - Increasing Fluids - Acetaminophenas needed for fever/pain.  - Mucinex.  - Saline sinus flushes or a neti pot.  - Humidifying the air Return to office in 2 weeks for follow-up.

## 2020-05-14 NOTE — Progress Notes (Signed)
LMP 11/22/2015 (Approximate)    Subjective:    Patient ID: Laura [name], female    DOB: [birth year], 34 y.o.   MRN: [id number]  HPI: Laura [name] is a 34 y.o. female  Chief Complaint  Patient presents with  . Generalized Body Aches    pt states body aches started today along with decreased appetite   . Headache    pt states she woke up with a headache today, tried tylenol otc hasn't help, pt states she has pressure behind her eyes   . Fatigue    pt states she has been feeling fatigue for a week     . This visit was completed via telephone due to the restrictions of the COVID-19 pandemic. All issues as above were discussed and addressed but no physical exam was performed. If it was felt that the patient should be evaluated in the office, they were directed there. The patient verbally consented to this visit. Patient was unable to complete an audio/visual visit due to Technical difficulties,Lack of internet. Due to the catastrophic nature of the COVID-19 pandemic, this visit was done through audio contact only. . Location of the patient: home . Location of the provider: work . Those involved with this call:  . Provider: Marnee Guarneri, DNP . CMA: Yvonna Alanis, CMA . Front Desk/Registration: Jill Side  . Time spent on call: 20 minutes on the phone discussing health concerns. 15 minutes total spent in review of patient's record and preparation of their chart.  . I verified patient identity using two factors (patient name and date of birth). Patient consents verbally to being seen via telemedicine visit today.   UPPER RESPIRATORY TRACT INFECTION Was exposed to Covid last Thursday -- had health at work test come back negative yesterday.  Was only having fatigue at time.  Today started with body aches, decreased appetite, and headache.  Has dizziness if standing too long.  Denies loss of taste or smell.  She is Covid vaccinated. Fever: no Cough: no Shortness of  breath: no Wheezing: no Chest pain: no Chest tightness: no Chest congestion: no Nasal congestion: yes Runny nose: yes Post nasal drip: yes Sneezing: no Sore throat: no Swollen glands: no Sinus pressure: yes Headache: yes Face pain: no Toothache: no Ear pain: none Ear pressure: none Eyes red/itching:no Eye drainage/crusting: no  Vomiting: no Rash: no Fatigue: yes Sick contacts: yes Strep contacts: no  Context: fluctuating Recurrent sinusitis: no Relief with OTC cold/cough medications: yes  Treatments attempted: Tylenol   Relevant past medical, surgical, family and social history reviewed and updated as indicated. Interim medical history since our last visit reviewed. Allergies and medications reviewed and updated.  Review of Systems  Constitutional: Positive for chills and fatigue. Negative for activity change, appetite change, diaphoresis and fever.  HENT: Positive for congestion, postnasal drip, rhinorrhea and sinus pressure. Negative for ear discharge, ear pain, sinus pain, sneezing and voice change.   Respiratory: Negative.   Cardiovascular: Negative.   Gastrointestinal: Negative.   Musculoskeletal: Positive for myalgias.  Neurological: Positive for headaches.    Per HPI unless specifically indicated above     Objective:    LMP 11/22/2015 (Approximate)   Wt Readings from Last 3 Encounters:  04/08/20 208 lb 3.2 oz (94.4 kg)  03/08/20 206 lb 6.4 oz (93.6 kg)  02/19/20 204 lb (92.5 kg)    Physical Exam   Unable to perform due to telephone visit only.  Results for orders placed or performed in visit on  12/28/19  WET PREP FOR TRICH, YEAST, CLUE   Specimen: Urine   Urine  Result Value Ref Range   Trichomonas Exam Negative Negative   Yeast Exam Negative Negative   Clue Cell Exam Positive (A) Negative  GC/Chlamydia Probe Amp   Specimen: Urine   UR  Result Value Ref Range   Chlamydia trachomatis, NAA Negative Negative   Neisseria Gonorrhoeae by PCR  Negative Negative  CBC with Differential/Platelet  Result Value Ref Range   WBC 5.8 3.4 - 10.8 x10E3/uL   RBC 4.61 3.77 - 5.28 x10E6/uL   Hemoglobin 14.2 11.1 - 15.9 g/dL   Hematocrit 42.0 34.0 - 46.6 %   MCV 91 79 - 97 fL   MCH 30.8 26.6 - 33.0 pg   MCHC 33.8 31 - 35 g/dL   RDW 13.0 11.7 - 15.4 %   Platelets 278 150 - 450 x10E3/uL   Neutrophils 46 Not Estab. %   Lymphs 46 Not Estab. %   Monocytes 7 Not Estab. %   Eos 1 Not Estab. %   Basos 0 Not Estab. %   Neutrophils Absolute 2.6 1.40 - 7.00 x10E3/uL   Lymphocytes Absolute 2.7 0 - 3 x10E3/uL   Monocytes Absolute 0.4 0 - 0 x10E3/uL   EOS (ABSOLUTE) 0.0 0.0 - 0.4 x10E3/uL   Basophils Absolute 0.0 0 - 0 x10E3/uL   Immature Granulocytes 0 Not Estab. %   Immature Grans (Abs) 0.0 0.0 - 0.1 x10E3/uL  Comprehensive metabolic panel  Result Value Ref Range   Glucose 78 65 - 99 mg/dL   BUN 10 6 - 20 mg/dL   Creatinine, Ser 0.78 0.57 - 1.00 mg/dL   GFR calc non Af Amer 100 >59 mL/min/1.73   GFR calc Af Amer 116 >59 mL/min/1.73   BUN/Creatinine Ratio 13 9 - 23   Sodium 139 134 - 144 mmol/L   Potassium 4.2 3.5 - 5.2 mmol/L   Chloride 104 96 - 106 mmol/L   CO2 22 20 - 29 mmol/L   Calcium 9.0 8.7 - 10.2 mg/dL   Total Protein 6.7 6.0 - 8.5 g/dL   Albumin 4.3 3.8 - 4.8 g/dL   Globulin, Total 2.4 1.5 - 4.5 g/dL   Albumin/Globulin Ratio 1.8 1.2 - 2.2   Bilirubin Total 0.3 0.0 - 1.2 mg/dL   Alkaline Phosphatase 90 48 - 121 IU/L   AST 18 0 - 40 IU/L   ALT 17 0 - 32 IU/L  Lipid Panel w/o Chol/HDL Ratio  Result Value Ref Range   Cholesterol, Total 162 100 - 199 mg/dL   Triglycerides 29 0 - 149 mg/dL   HDL 55 >39 mg/dL   VLDL Cholesterol Cal 7 5 - 40 mg/dL   LDL Chol Calc (NIH) 100 (H) 0 - 99 mg/dL  TSH  Result Value Ref Range   TSH 1.490 0.450 - 4.500 uIU/mL  UA/M w/rflx Culture, Routine   Specimen: Urine   Urine  Result Value Ref Range   Specific Gravity, UA 1.020 1.005 - 1.030   pH, UA 7.5 5.0 - 7.5   Color, UA Yellow Yellow     Appearance Ur Clear Clear   Leukocytes,UA Negative Negative   Protein,UA Negative Negative/Trace   Glucose, UA Negative Negative   Ketones, UA Negative Negative   RBC, UA Negative Negative   Bilirubin, UA Negative Negative   Urobilinogen, Ur 0.2 0.2 - 1.0 mg/dL   Nitrite, UA Negative Negative  Vitamin B12  Result Value Ref Range   Vitamin B-12  644 232 - 1,245 pg/mL  VITAMIN D 25 Hydroxy (Vit-D Deficiency, Fractures)  Result Value Ref Range   Vit D, 25-Hydroxy 13.4 (L) 30.0 - 100.0 ng/mL      Assessment & Plan:   Problem List Items Addressed This Visit      Other   Myalgia - Primary    Acute with headache, fatigue, and sinus symptoms.  Have recommended retesting for Covid now that symptoms are present, she will reach out to workplace to schedule and alert provider of results.  Recommend she self quarantine until testing returned and symptoms improved.  Will send in script for Prednisone 40 MG x 5 days.  Recommend: - Increased rest - Increasing Fluids - Acetaminophenas needed for fever/pain.  - Mucinex.  - Saline sinus flushes or a neti pot.  - Humidifying the air Return to office in 2 weeks for follow-up.         I discussed the assessment and treatment plan with the patient. The patient was provided an opportunity to ask questions and all were answered. The patient agreed with the plan and demonstrated an understanding of the instructions.   The patient was advised to call back or seek an in-person evaluation if the symptoms worsen or if the condition fails to improve as anticipated.   I provided 21+ minutes of time during this encounter.  Follow up plan: Return in about 2 weeks (around 05/28/2020) for Fatigue follow-up.

## 2020-06-03 ENCOUNTER — Ambulatory Visit: Payer: No Typology Code available for payment source | Admitting: Nurse Practitioner

## 2020-06-10 ENCOUNTER — Ambulatory Visit: Payer: No Typology Code available for payment source | Admitting: Nurse Practitioner

## 2020-06-29 ENCOUNTER — Other Ambulatory Visit: Payer: Self-pay | Admitting: Nurse Practitioner

## 2020-07-01 ENCOUNTER — Ambulatory Visit: Payer: No Typology Code available for payment source | Admitting: Nurse Practitioner

## 2020-07-01 ENCOUNTER — Telehealth: Payer: Self-pay

## 2020-07-01 NOTE — Telephone Encounter (Signed)
PA approved.

## 2020-07-01 NOTE — Telephone Encounter (Signed)
PA for Saxenda initiated and submitted via Cover My Meds. Key: [id number]

## 2020-08-05 ENCOUNTER — Other Ambulatory Visit: Payer: Self-pay | Admitting: Nurse Practitioner

## 2020-08-05 ENCOUNTER — Ambulatory Visit (INDEPENDENT_AMBULATORY_CARE_PROVIDER_SITE_OTHER): Payer: No Typology Code available for payment source | Admitting: Nurse Practitioner

## 2020-08-05 ENCOUNTER — Other Ambulatory Visit: Payer: Self-pay

## 2020-08-05 ENCOUNTER — Encounter: Payer: Self-pay | Admitting: Nurse Practitioner

## 2020-08-05 DIAGNOSIS — B351 Tinea unguium: Secondary | ICD-10-CM | POA: Insufficient documentation

## 2020-08-05 DIAGNOSIS — Z6839 Body mass index (BMI) 39.0-39.9, adult: Secondary | ICD-10-CM | POA: Diagnosis not present

## 2020-08-05 DIAGNOSIS — E6609 Other obesity due to excess calories: Secondary | ICD-10-CM

## 2020-08-05 DIAGNOSIS — F331 Major depressive disorder, recurrent, moderate: Secondary | ICD-10-CM

## 2020-08-05 MED ORDER — CHOLECALCIFEROL 1.25 MG (50000 UT) PO TABS: 1.0000 | ORAL_TABLET | ORAL | 3 refills | Status: DC

## 2020-08-05 MED ORDER — CONTRAVE 8-90 MG PO TB12: ORAL_TABLET | ORAL | 4 refills | Status: DC

## 2020-08-05 NOTE — Assessment & Plan Note (Signed)
Right great toe, recommend starting with OTC medications and monitoring feet -- keep feet dry as often as possible.  Return for worsening or ongoing.

## 2020-08-05 NOTE — Assessment & Plan Note (Signed)
Ongoing with wishes to lose weight and meet goal of 180 lbs.  She is working out regularly and following lower calorie diet at present with no benefit.  Tried Saxenda with side effects present -- migraines and injection site reactions.   Will trial change to Contrave for weight loss, which has Wellbutrin within it -- this would benefit patient from both weight loss and mood perspective + allow minimization in medication regimen.  Script sent: educated on how to cut back on current Wellbutrin dosing with starting Contrave -- cut back to 150 MG on week 1 and 2 of Contrave and then stop her current Wellbutrin tablets on week 3 Contrave.  Return in 8 weeks for follow-up with her new PCP.

## 2020-08-05 NOTE — Progress Notes (Signed)
BP 116/81   Pulse 81   Temp 98.1 F (36.7 C) (Oral)   Ht 5' 1.46" (1.561 m)   Wt 214 lb (97.1 kg)   LMP 11/22/2015 (Approximate)   SpO2 97%   BMI 39.84 kg/m    Subjective:    Patient ID: Laura [name], female    DOB: 1986/05/26, 35 y.o.   MRN: [id number]  HPI: Laura [name] is a 35 y.o. female  Chief Complaint  Patient presents with  . Weight Check   OBESITY: Currently taking Saxenda which she reports -- in 4 months has lost 6 pounds, along with keto diet.  Her goal weight is 180 pounds.  Currently not doing keto diet, ran out of [place] one month ago.  Does exercise, Zoomba, 3 days a week -- does this at home.  Kirke Shaggy was having tenderness at site of injection and bruising + migraine type headaches with this -- did not like side effects.  Is interested in trying Contrave.  She is currently on Wellbutrin and has been for awhile.  Following low calorie diet at home.    In past has taken Phentermine, followed by weight management in past.  Lost 27 pounds with this.  HCG injections done with weight management too.  Her Vitamin D level was low on labs July 2021 -- 13.4.     Depression screen [facility] 2/9 08/05/2020 08/05/2020 02/19/2020 01/18/2020 12/28/2019  Decreased Interest 0 0 1 3 1   Down, Depressed, Hopeless 0 0 1 1 1   PHQ - 2 Score 0 0 2 4 2   Altered sleeping 0 - 3 3 3   Tired, decreased energy 0 - 3 3 3   Change in appetite 0 - 0 3 3  Feeling bad or failure about yourself  0 - 2 3 1   Trouble concentrating 0 - 3 1 3   Moving slowly or fidgety/restless 0 - 1 0 0  Suicidal thoughts 0 - 0 0 0  PHQ-9 Score 0 - 14 17 15   Difficult doing work/chores Not difficult at all - - Extremely dIfficult -   Relevant past medical, surgical, family and social history reviewed and updated as indicated. Interim medical history since our last visit reviewed. Allergies and medications reviewed and updated.  Review of Systems  Constitutional: Negative for activity change, appetite change,  diaphoresis, fatigue and fever.  Respiratory: Negative for cough, chest tightness and shortness of breath.   Cardiovascular: Negative for chest pain, palpitations and leg swelling.  Gastrointestinal: Negative.   Neurological: Negative.   Psychiatric/Behavioral: Negative.     Per HPI unless specifically indicated above     Objective:    BP 116/81   Pulse 81   Temp 98.1 F (36.7 C) (Oral)   Ht 5' 1.46" (1.561 m)   Wt 214 lb (97.1 kg)   LMP 11/22/2015 (Approximate)   SpO2 97%   BMI 39.84 kg/m   Wt Readings from Last 3 Encounters:  08/05/20 214 lb (97.1 kg)  04/08/20 208 lb 3.2 oz (94.4 kg)  03/08/20 206 lb 6.4 oz (93.6 kg)    Physical Exam Vitals and nursing note reviewed.  Constitutional:      General: She is awake. She is not in acute distress.    Appearance: She is well-developed and well-groomed. She is obese. She is not ill-appearing.  HENT:     Head: Normocephalic.     Right Ear: Hearing normal.     Left Ear: Hearing normal.  Eyes:     General: Lids  are normal.        Right eye: No discharge.        Left eye: No discharge.     Conjunctiva/sclera: Conjunctivae normal.     Pupils: Pupils are equal, round, and reactive to light.  Neck:     Thyroid: No thyromegaly.     Vascular: No carotid bruit.  Cardiovascular:     Rate and Rhythm: Normal rate and regular rhythm.     Pulses:          Dorsalis pedis pulses are 2+ on the right side and 2+ on the left side.       Posterior tibial pulses are 2+ on the right side and 2+ on the left side.     Heart sounds: Normal heart sounds. No murmur heard. No gallop.   Pulmonary:     Effort: Pulmonary effort is normal. No accessory muscle usage or respiratory distress.     Breath sounds: Normal breath sounds.  Abdominal:     General: Bowel sounds are normal.     Palpations: Abdomen is soft. There is no hepatomegaly or splenomegaly.  Musculoskeletal:     Cervical back: Normal range of motion and neck supple.     Right lower  leg: No edema.     Left lower leg: No edema.  Feet:     Right foot:     Protective Sensation: 10 sites tested. 10 sites sensed.     Skin integrity: Skin integrity normal.     Toenail Condition: Fungal disease present.    Left foot:     Protective Sensation: 10 sites tested. 10 sites sensed.     Skin integrity: Skin integrity normal.     Toenail Condition: Left toenails are normal.     Comments: Fungal infection noted right great toe. Skin:    General: Skin is warm and dry.  Neurological:     Mental Status: She is alert and oriented to person, place, and time.  Psychiatric:        Attention and Perception: Attention normal.        Mood and Affect: Mood normal.        Speech: Speech normal.        Behavior: Behavior normal. Behavior is cooperative.        Thought Content: Thought content normal.    Results for orders placed or performed in visit on 12/28/19  WET PREP FOR [place], YEAST, CLUE   Specimen: Urine   Urine  Result Value Ref Range   Trichomonas Exam Negative Negative   Yeast Exam Negative Negative   Clue Cell Exam Positive (A) Negative  GC/Chlamydia Probe Amp   Specimen: Urine   UR  Result Value Ref Range   Chlamydia trachomatis, NAA Negative Negative   Neisseria Gonorrhoeae by PCR Negative Negative  CBC with Differential/Platelet  Result Value Ref Range   WBC 5.8 3.4 - 10.8 x10E3/uL   RBC 4.61 3.77 - 5.28 x10E6/uL   Hemoglobin 14.2 11.1 - 15.9 g/dL   Hematocrit 42.0 34.0 - 46.6 %   MCV 91 79 - 97 fL   MCH 30.8 26.6 - 33.0 pg   MCHC 33.8 31.5 - 35.7 g/dL   RDW 13.0 11.7 - 15.4 %   Platelets 278 150 - 450 x10E3/uL   Neutrophils 46 Not Estab. %   Lymphs 46 Not Estab. %   Monocytes 7 Not Estab. %   Eos 1 Not Estab. %   Basos 0 Not Estab. %  Neutrophils Absolute 2.6 1.4 - 7.0 x10E3/uL   Lymphocytes Absolute 2.7 0.7 - 3.1 x10E3/uL   Monocytes Absolute 0.4 0.1 - 0.9 x10E3/uL   EOS (ABSOLUTE) 0.0 0.0 - 0.4 x10E3/uL   Basophils Absolute 0.0 0.0 - 0.2 x10E3/uL    Immature Granulocytes 0 Not Estab. %   Immature Grans (Abs) 0.0 0.0 - 0.1 x10E3/uL  Comprehensive metabolic panel  Result Value Ref Range   Glucose 78 65 - 99 mg/dL   BUN 10 6 - 20 mg/dL   Creatinine, Ser 0.78 0.57 - 1.00 mg/dL   GFR calc non Af Amer 100 >59 mL/min/1.73   GFR calc Af Amer 116 >59 mL/min/1.73   BUN/Creatinine Ratio 13 9 - 23   Sodium 139 134 - 144 mmol/L   Potassium 4.2 3.5 - 5.2 mmol/L   Chloride 104 96 - 106 mmol/L   CO2 22 20 - 29 mmol/L   Calcium 9.0 8.7 - 10.2 mg/dL   Total Protein 6.7 6.0 - 8.5 g/dL   Albumin 4.3 3.8 - 4.8 g/dL   Globulin, Total 2.4 1.5 - 4.5 g/dL   Albumin/Globulin Ratio 1.8 1.2 - 2.2   Bilirubin Total 0.3 0.0 - 1.2 mg/dL   Alkaline Phosphatase 90 48 - 121 IU/L   AST 18 0 - 40 IU/L   ALT 17 0 - 32 IU/L  Lipid Panel w/o Chol/HDL Ratio  Result Value Ref Range   Cholesterol, Total 162 100 - 199 mg/dL   Triglycerides 29 0 - 149 mg/dL   HDL 55 >39 mg/dL   VLDL Cholesterol Cal 7 5 - 40 mg/dL   LDL Chol Calc (NIH) 100 (H) 0 - 99 mg/dL  TSH  Result Value Ref Range   TSH 1.490 0.450 - 4.500 uIU/mL  UA/M w/rflx Culture, Routine   Specimen: Urine   Urine  Result Value Ref Range   Specific Gravity, UA 1.020 1.005 - 1.030   pH, UA 7.5 5.0 - 7.5   Color, UA Yellow Yellow   Appearance Ur Clear Clear   Leukocytes,UA Negative Negative   Protein,UA Negative Negative/Trace   Glucose, UA Negative Negative   Ketones, UA Negative Negative   RBC, UA Negative Negative   Bilirubin, UA Negative Negative   Urobilinogen, Ur 0.2 0.2 - 1.0 mg/dL   Nitrite, UA Negative Negative  Vitamin B12  Result Value Ref Range   Vitamin B-12 644 232 - 1,245 pg/mL  VITAMIN D 25 Hydroxy (Vit-D Deficiency, Fractures)  Result Value Ref Range   Vit D, 25-Hydroxy 13.4 (L) 30.0 - 100.0 ng/mL      Assessment & Plan:   Problem List Items Addressed This Visit      Musculoskeletal and Integument   Onychomycosis    Right great toe, recommend starting with OTC  medications and monitoring feet -- keep feet dry as often as possible.  Return for worsening or ongoing.         Other   Depression    Chronic, stable with current regimen.  Denies SI/HI.  Will trial change to Contrave for weight loss, which has Wellbutrin within it -- this would benefit patient from both weight loss and mood perspective + allow minimization in medication regimen.  Script sent: educated on how to cut back on current Wellbutrin with starting Contrave -- cut back to 150 MG on week 1 and 2 of Contrave and then stop her current Wellbutrin tablets on week 3 Contrave.  Return to office in 8 weeks for follow-up.  Obesity    Ongoing with wishes to lose weight and meet goal of 180 lbs.  She is working out regularly and following lower calorie diet at present with no benefit.  Tried Saxenda with side effects present -- migraines and injection site reactions.   Will trial change to Contrave for weight loss, which has Wellbutrin within it -- this would benefit patient from both weight loss and mood perspective + allow minimization in medication regimen.  Script sent: educated on how to cut back on current Wellbutrin dosing with starting Contrave -- cut back to 150 MG on week 1 and 2 of Contrave and then stop her current Wellbutrin tablets on week 3 Contrave.  Return in 8 weeks for follow-up with her new PCP.      Relevant Medications   Naltrexone-buPROPion HCl ER (CONTRAVE) 8-90 MG TB12       Follow up plan: Return in about 8 weeks (around 09/30/2020) for Weight loss and Vitamin D check.

## 2020-08-05 NOTE — Patient Instructions (Signed)
Healthy Eating Following a healthy eating pattern may help you to achieve and maintain a healthy body weight, reduce the risk of chronic disease, and live a long and productive life. It is important to follow a healthy eating pattern at an appropriate calorie level for your body. Your nutritional needs should be met primarily through food by choosing a variety of nutrient-rich foods. What are tips for following this plan? Reading food labels  Read labels and choose the following: ? Reduced or low sodium. ? Juices with 100% fruit juice. ? Foods with low saturated fats and high polyunsaturated and monounsaturated fats. ? Foods with whole grains, such as whole wheat, cracked wheat, brown rice, and wild rice. ? Whole grains that are fortified with folic acid. This is recommended for women who are pregnant or who want to become pregnant.  Read labels and avoid the following: ? Foods with a lot of added sugars. These include foods that contain brown sugar, corn sweetener, corn syrup, dextrose, fructose, glucose, high-fructose corn syrup, honey, invert sugar, lactose, malt syrup, maltose, molasses, raw sugar, sucrose, trehalose, or turbinado sugar.  Do not eat more than the following amounts of added sugar per day:  6 teaspoons (25 g) for women.  9 teaspoons (38 g) for men. ? Foods that contain processed or refined starches and grains. ? Refined grain products, such as white flour, degermed cornmeal, white bread, and white rice. Shopping  Choose nutrient-rich snacks, such as vegetables, whole fruits, and nuts. Avoid high-calorie and high-sugar snacks, such as potato chips, fruit snacks, and candy.  Use oil-based dressings and spreads on foods instead of solid fats such as butter, stick margarine, or cream cheese.  Limit pre-made sauces, mixes, and "instant" products such as flavored rice, instant noodles, and ready-made pasta.  Try more plant-protein sources, such as tofu, tempeh, black beans,  edamame, lentils, nuts, and seeds.  Explore eating plans such as the Mediterranean diet or vegetarian diet. Cooking  Use oil to saut or stir-fry foods instead of solid fats such as butter, stick margarine, or lard.  Try baking, boiling, grilling, or broiling instead of frying.  Remove the fatty part of meats before cooking.  Steam vegetables in water or broth. Meal planning  At meals, imagine dividing your plate into fourths: ? One-half of your plate is fruits and vegetables. ? One-fourth of your plate is whole grains. ? One-fourth of your plate is protein, especially lean meats, poultry, eggs, tofu, beans, or nuts.  Include low-fat dairy as part of your daily diet.   Lifestyle  Choose healthy options in all settings, including home, work, school, restaurants, or stores.  Prepare your food safely: ? Wash your hands after handling raw meats. ? Keep food preparation surfaces clean by regularly washing with hot, soapy water. ? Keep raw meats separate from ready-to-eat foods, such as fruits and vegetables. ? Cook seafood, meat, poultry, and eggs to the recommended internal temperature. ? Store foods at safe temperatures. In general:  Keep cold foods at 7F (4.4C) or below.  Keep hot foods at 17F (60C) or above.  Keep your freezer at [facility] (-17.8C) or below.  Foods are no longer safe to eat when they have been between the temperatures of 40-17F (4.4-60C) for more than 2 hours. What foods should I eat? Fruits Aim to eat 2 cup-equivalents of fresh, canned (in natural juice), or frozen fruits each day. Examples of 1 cup-equivalent of fruit include 1 small apple, 8 large strawberries, 1 cup canned fruit,  cup dried fruit, or 1 cup 100% juice. Vegetables Aim to eat 2-3 cup-equivalents of fresh and frozen vegetables each day, including different varieties and colors. Examples of 1 cup-equivalent of vegetables include 2 medium carrots, 2 cups raw, leafy greens, 1 cup chopped  vegetable (raw or cooked), or 1 medium baked potato. Grains Aim to eat 6 ounce-equivalents of whole grains each day. Examples of 1 ounce-equivalent of grains include 1 slice of bread, 1 cup ready-to-eat cereal, 3 cups popcorn, or  cup cooked rice, pasta, or cereal. Meats and other proteins Aim to eat 5-6 ounce-equivalents of protein each day. Examples of 1 ounce-equivalent of protein include 1 egg, 1/2 cup nuts or seeds, or 1 tablespoon (16 g) peanut butter. A cut of meat or fish that is the size of a deck of cards is about 3-4 ounce-equivalents.  Of the protein you eat each week, try to have at least 8 ounces come from seafood. This includes salmon, trout, herring, and anchovies. Dairy Aim to eat 3 cup-equivalents of fat-free or low-fat dairy each day. Examples of 1 cup-equivalent of dairy include 1 cup (240 mL) milk, 8 ounces (250 g) yogurt, 1 ounces (44 g) natural cheese, or 1 cup (240 mL) fortified soy milk. Fats and oils  Aim for about 5 teaspoons (21 g) per day. Choose monounsaturated fats, such as canola and olive oils, avocados, peanut butter, and most nuts, or polyunsaturated fats, such as sunflower, corn, and soybean oils, walnuts, pine nuts, sesame seeds, sunflower seeds, and flaxseed. Beverages  Aim for six 8-oz glasses of water per day. Limit coffee to three to five 8-oz cups per day.  Limit caffeinated beverages that have added calories, such as soda and energy drinks.  Limit alcohol intake to no more than 1 drink a day for nonpregnant women and 2 drinks a day for men. One drink equals 12 oz of beer (355 mL), 5 oz of wine (148 mL), or 1 oz of hard liquor (44 mL). Seasoning and other foods  Avoid adding excess amounts of salt to your foods. Try flavoring foods with herbs and spices instead of salt.  Avoid adding sugar to foods.  Try using oil-based dressings, sauces, and spreads instead of solid fats. This information is based on general [place] nutrition guidelines. For more  information, visit choosemyplate.gov. Exact amounts may vary based on your nutrition needs. Summary  A healthy eating plan may help you to maintain a healthy weight, reduce the risk of chronic diseases, and stay active throughout your life.  Plan your meals. Make sure you eat the right portions of a variety of nutrient-rich foods.  Try baking, boiling, grilling, or broiling instead of frying.  Choose healthy options in all settings, including home, work, school, restaurants, or stores. This information is not intended to replace advice given to you by your health care provider. Make sure you discuss any questions you have with your health care provider. Document Revised: 09/27/2017 Document Reviewed: 09/27/2017 Elsevier Patient Education  2021 Elsevier Inc.  

## 2020-08-05 NOTE — Assessment & Plan Note (Signed)
Chronic, stable with current regimen.  Denies SI/HI.  Will trial change to Contrave for weight loss, which has Wellbutrin within it -- this would benefit patient from both weight loss and mood perspective + allow minimization in medication regimen.  Script sent: educated on how to cut back on current Wellbutrin with starting Contrave -- cut back to 150 MG on week 1 and 2 of Contrave and then stop her current Wellbutrin tablets on week 3 Contrave.  Return to office in 8 weeks for follow-up.

## 2020-08-06 ENCOUNTER — Telehealth: Payer: Self-pay

## 2020-08-06 NOTE — Telephone Encounter (Signed)
PA for Contrave initiated and submitted via Cover My Meds. Key: [id number]

## 2020-08-09 NOTE — Telephone Encounter (Signed)
Contrave ER 8-90 mg tables with 1 refill has been partially approved from 08/08/20 to 09/04/20.  Patient notified

## 2020-08-21 ENCOUNTER — Telehealth: Payer: No Typology Code available for payment source | Admitting: Family

## 2020-08-21 DIAGNOSIS — B373 Candidiasis of vulva and vagina: Secondary | ICD-10-CM | POA: Diagnosis not present

## 2020-08-21 DIAGNOSIS — B3731 Acute candidiasis of vulva and vagina: Secondary | ICD-10-CM

## 2020-08-21 MED ORDER — FLUCONAZOLE 150 MG PO TABS: 150.0000 mg | ORAL_TABLET | ORAL | 0 refills | Status: DC | PRN

## 2020-08-21 NOTE — Progress Notes (Signed)

## 2020-08-22 ENCOUNTER — Telehealth: Payer: Self-pay | Admitting: Emergency Medicine

## 2020-08-22 ENCOUNTER — Other Ambulatory Visit: Payer: Self-pay | Admitting: Emergency Medicine

## 2020-08-22 DIAGNOSIS — N898 Other specified noninflammatory disorders of vagina: Secondary | ICD-10-CM

## 2020-08-22 DIAGNOSIS — N760 Acute vaginitis: Secondary | ICD-10-CM

## 2020-08-22 MED ORDER — METRONIDAZOLE 500 MG PO TABS: 500.0000 mg | ORAL_TABLET | Freq: Two times a day (BID) | ORAL | 0 refills | Status: DC

## 2020-08-22 NOTE — Progress Notes (Signed)
Thank you for the additional information.  I have sent flagyl to your preferred pharmacy.  Please follow up with your PCP or an urgent care in person if not improving by next week.   We are sorry that you are not feeling well. Here is how we plan to help! Based on what you shared with me it looks like you: May have a vaginosis due to bacteria  Vaginosis is an inflammation of the vagina that can result in discharge, itching and pain. The cause is usually a change in the normal balance of vaginal bacteria or an infection. Vaginosis can also result from reduced estrogen levels after menopause.  The most common causes of vaginosis are:   Bacterial vaginosis which results from an overgrowth of one on several organisms that are normally present in your vagina.   Yeast infections which are caused by a naturally occurring fungus called candida.   Vaginal atrophy (atrophic vaginosis) which results from the thinning of the vagina from reduced estrogen levels after menopause.   Trichomoniasis which is caused by a parasite and is commonly transmitted by sexual intercourse.  Factors that increase your risk of developing vaginosis include: Marland Kitchen Medications, such as antibiotics and steroids . Uncontrolled diabetes . Use of hygiene products such as bubble bath, vaginal spray or vaginal deodorant . Douching . Wearing damp or tight-fitting clothing . Using an intrauterine device (IUD) for birth control . Hormonal changes, such as those associated with pregnancy, birth control pills or menopause . Sexual activity . Having a sexually transmitted infection  Your treatment plan is Metronidazole or Flagyl 500mg  twice a day for 7 days.  I have electronically sent this prescription into the pharmacy that you have chosen.  Be sure to take all of the medication as directed. Stop taking any medication if you develop a rash, tongue swelling or shortness of breath. Mothers who are breast feeding should consider pumping  and discarding their breast milk while on these antibiotics. However, there is no consensus that infant exposure at these doses would be harmful.  Remember that medication creams can weaken latex condoms. Marland Kitchen   HOME CARE:  Good hygiene may prevent some types of vaginosis from recurring and may relieve some symptoms:  . Avoid baths, hot tubs and whirlpool spas. Rinse soap from your outer genital area after a shower, and dry the area well to prevent irritation. Don't use scented or harsh soaps, such as those with deodorant or antibacterial action. Marland Kitchen Avoid irritants. These include scented tampons and pads. . Wipe from front to back after using the toilet. Doing so avoids spreading fecal bacteria to your vagina.  Other things that may help prevent vaginosis include:  Marland Kitchen Don't douche. Your vagina doesn't require cleansing other than normal bathing. Repetitive douching disrupts the normal organisms that reside in the vagina and can actually increase your risk of vaginal infection. Douching won't clear up a vaginal infection. . Use a latex condom. Both female and female latex condoms may help you avoid infections spread by sexual contact. . Wear cotton underwear. Also wear pantyhose with a cotton crotch. If you feel comfortable without it, skip wearing underwear to bed. Yeast thrives in [employer] Your symptoms should improve in the next day or two.  GET HELP RIGHT AWAY IF:  . You have pain in your lower abdomen ( pelvic area or over your ovaries) . You develop nausea or vomiting . You develop a fever . Your discharge changes or worsens . You have persistent  pain with intercourse . You develop shortness of breath, a rapid pulse, or you faint.  These symptoms could be signs of problems or infections that need to be evaluated by a medical provider now.  MAKE SURE YOU    Understand these instructions.  Will watch your condition.  Will get help right away if you are not doing well or  get worse.  Your e-visit answers were reviewed by a board certified advanced clinical practitioner to complete your personal care plan. Depending upon the condition, your plan could have included both over the counter or prescription medications. Please review your pharmacy choice to make sure that you have choses a pharmacy that is open for you to pick up any needed prescription, Your safety is important to [place]. If you have drug allergies check your prescription carefully.   You can use MyChart to ask questions about today's visit, request a non-urgent call back, or ask for a work or school excuse for 24 hours related to this e-Visit. If it has been greater than 24 hours you will need to follow up with your provider, or enter a new e-Visit to address those concerns. You will get a MyChart message within the next two days asking about your experience. I hope that your e-visit has been valuable and will speed your recovery.  Approximately 5 minutes was spent documenting and reviewing patient's chart.

## 2020-08-23 MED ORDER — METRONIDAZOLE 500 MG PO TABS: 500.0000 mg | ORAL_TABLET | Freq: Two times a day (BID) | ORAL | 0 refills | Status: DC

## 2020-08-23 NOTE — Addendum Note (Signed)
Addended by: Noe Gens on: 08/23/2020 09:36 AM   Modules accepted: Orders

## 2020-09-29 NOTE — Progress Notes (Deleted)
LMP 11/22/2015 (Approximate)    Subjective:    Patient ID: Laura [name], female    DOB: 1985/07/15, 35 y.o.   MRN: [id number]  HPI: Laura [name] is a 35 y.o. female  No chief complaint on file.  WEIGHT MANAGEMENT  Relevant past medical, surgical, family and social history reviewed and updated as indicated. Interim medical history since our last visit reviewed. Allergies and medications reviewed and updated.  Review of Systems  Per HPI unless specifically indicated above     Objective:    LMP 11/22/2015 (Approximate)   Wt Readings from Last 3 Encounters:  08/05/20 214 lb (97.1 kg)  04/08/20 208 lb 3.2 oz (94.4 kg)  03/08/20 206 lb 6.4 oz (93.6 kg)    Physical Exam  Results for orders placed or performed in visit on 12/28/19  WET PREP FOR [place], YEAST, CLUE   Specimen: Urine   Urine  Result Value Ref Range   Trichomonas Exam Negative Negative   Yeast Exam Negative Negative   Clue Cell Exam Positive (A) Negative  GC/Chlamydia Probe Amp   Specimen: Urine   UR  Result Value Ref Range   Chlamydia trachomatis, NAA Negative Negative   Neisseria Gonorrhoeae by PCR Negative Negative  CBC with Differential/Platelet  Result Value Ref Range   WBC 5.8 3.4 - 10.8 x10E3/uL   RBC 4.61 3.77 - 5.28 x10E6/uL   Hemoglobin 14.2 11.1 - 15.9 g/dL   Hematocrit 42.0 34.0 - 46.6 %   MCV 91 79 - 97 fL   MCH 30.8 26.6 - 33.0 pg   MCHC 33.8 31.5 - 35.7 g/dL   RDW 13.0 11.7 - 15.4 %   Platelets 278 150 - 450 x10E3/uL   Neutrophils 46 Not Estab. %   Lymphs 46 Not Estab. %   Monocytes 7 Not Estab. %   Eos 1 Not Estab. %   Basos 0 Not Estab. %   Neutrophils Absolute 2.6 1.4 - 7.0 x10E3/uL   Lymphocytes Absolute 2.7 0.7 - 3.1 x10E3/uL   Monocytes Absolute 0.4 0.1 - 0.9 x10E3/uL   EOS (ABSOLUTE) 0.0 0.0 - 0.4 x10E3/uL   Basophils Absolute 0.0 0.0 - 0.2 x10E3/uL   Immature Granulocytes 0 Not Estab. %   Immature Grans (Abs) 0.0 0.0 - 0.1 x10E3/uL  Comprehensive metabolic  panel  Result Value Ref Range   Glucose 78 65 - 99 mg/dL   BUN 10 6 - 20 mg/dL   Creatinine, Ser 0.78 0.57 - 1.00 mg/dL   GFR calc non Af Amer 100 >59 mL/min/1.73   GFR calc Af Amer 116 >59 mL/min/1.73   BUN/Creatinine Ratio 13 9 - 23   Sodium 139 134 - 144 mmol/L   Potassium 4.2 3.5 - 5.2 mmol/L   Chloride 104 96 - 106 mmol/L   CO2 22 20 - 29 mmol/L   Calcium 9.0 8.7 - 10.2 mg/dL   Total Protein 6.7 6.0 - 8.5 g/dL   Albumin 4.3 3.8 - 4.8 g/dL   Globulin, Total 2.4 1.5 - 4.5 g/dL   Albumin/Globulin Ratio 1.8 1.2 - 2.2   Bilirubin Total 0.3 0.0 - 1.2 mg/dL   Alkaline Phosphatase 90 48 - 121 IU/L   AST 18 0 - 40 IU/L   ALT 17 0 - 32 IU/L  Lipid Panel w/o Chol/HDL Ratio  Result Value Ref Range   Cholesterol, Total 162 100 - 199 mg/dL   Triglycerides 29 0 - 149 mg/dL   HDL 55 >39 mg/dL   VLDL Cholesterol  Cal 7 5 - 40 mg/dL   LDL Chol Calc (NIH) 100 (H) 0 - 99 mg/dL  TSH  Result Value Ref Range   TSH 1.490 0.450 - 4.500 uIU/mL  UA/M w/rflx Culture, Routine   Specimen: Urine   Urine  Result Value Ref Range   Specific Gravity, UA 1.020 1.005 - 1.030   pH, UA 7.5 5.0 - 7.5   Color, UA Yellow Yellow   Appearance Ur Clear Clear   Leukocytes,UA Negative Negative   Protein,UA Negative Negative/Trace   Glucose, UA Negative Negative   Ketones, UA Negative Negative   RBC, UA Negative Negative   Bilirubin, UA Negative Negative   Urobilinogen, Ur 0.2 0.2 - 1.0 mg/dL   Nitrite, UA Negative Negative  Vitamin B12  Result Value Ref Range   Vitamin B-12 644 232 - 1,245 pg/mL  VITAMIN D 25 Hydroxy (Vit-D Deficiency, Fractures)  Result Value Ref Range   Vit D, 25-Hydroxy 13.4 (L) 30.0 - 100.0 ng/mL      Assessment & Plan:   Problem List Items Addressed This Visit   None      Follow up plan: No follow-ups on file.

## 2020-09-30 ENCOUNTER — Encounter: Payer: Self-pay | Admitting: Urology

## 2020-09-30 ENCOUNTER — Other Ambulatory Visit: Payer: Self-pay

## 2020-09-30 ENCOUNTER — Ambulatory Visit (INDEPENDENT_AMBULATORY_CARE_PROVIDER_SITE_OTHER): Payer: No Typology Code available for payment source | Admitting: Urology

## 2020-09-30 ENCOUNTER — Ambulatory Visit: Payer: No Typology Code available for payment source | Admitting: Nurse Practitioner

## 2020-09-30 VITALS — BP 118/83 | HR 93 | Wt 214.0 lb

## 2020-09-30 DIAGNOSIS — N3946 Mixed incontinence: Secondary | ICD-10-CM | POA: Diagnosis not present

## 2020-09-30 DIAGNOSIS — N393 Stress incontinence (female) (male): Secondary | ICD-10-CM | POA: Diagnosis not present

## 2020-09-30 DIAGNOSIS — E6609 Other obesity due to excess calories: Secondary | ICD-10-CM

## 2020-09-30 LAB — MICROSCOPIC EXAMINATION
Bacteria, UA: NONE SEEN
RBC, Urine: NONE SEEN /hpf (ref 0–2)

## 2020-09-30 LAB — BLADDER SCAN AMB NON-IMAGING: Scan Result: 3

## 2020-09-30 LAB — URINALYSIS, COMPLETE
Bilirubin, UA: NEGATIVE
Glucose, UA: NEGATIVE
Ketones, UA: NEGATIVE
Leukocytes,UA: NEGATIVE
Nitrite, UA: NEGATIVE
Protein,UA: NEGATIVE
RBC, UA: NEGATIVE
Specific Gravity, UA: 1.025 (ref 1.005–1.030)
Urobilinogen, Ur: 0.2 mg/dL (ref 0.2–1.0)
pH, UA: 7 (ref 5.0–7.5)

## 2020-09-30 MED ORDER — VIBEGRON 75 MG PO TABS: 75.0000 mg | ORAL_TABLET | Freq: Every day | ORAL | 11 refills | Status: DC

## 2020-09-30 NOTE — Addendum Note (Signed)
Addended by: Verlene Mayer A on: 09/30/2020 03:10 PM   Modules accepted: Orders

## 2020-09-30 NOTE — Addendum Note (Signed)
Addended by: Maryln Gottron on: 09/30/2020 03:05 PM   Modules accepted: Orders

## 2020-09-30 NOTE — Progress Notes (Signed)
09/30/2020 12:56 PM   Laura [name] 1985-07-09 [id number]  Referring provider: Volney American, PA-C [address] [place],  [place] [address]  Chief Complaint  Patient presents with  . Urinary Incontinence    HPI: I was consulted to assess the patient is urinary incontinence.  Initially she was having post void dribbling even if she sat for a few moments.  Now she is having almost daily urge incontinence.  She leaks a variable amount with coughing and sneezing.  She wears 3 or 4 liners a day that are damp  She voids every 2 hours and has had a hysterectomy.  No nocturia  Patient denies history of kidney stones bladder surgery and urinary tract infections.  No neurologic issues     PMH: Past Medical History:  Diagnosis Date  . Anxiety   . Bipolar disorder ([place])    past hx of  . Complication of anesthesia    "woke up during surgery"  . Depression   . Heavy periods   . Incontinence of urine   . Increased BMI   . Indigestion   . PTSD (post-traumatic stress disorder)   . Tobacco user     Surgical History: Past Surgical History:  Procedure Laterality Date  . ABDOMINAL HYSTERECTOMY  2017  . CESAREAN SECTION  2010  . LAPAROSCOPIC VAGINAL HYSTERECTOMY WITH SALPINGECTOMY Bilateral 12/16/2015   Procedure: LAPAROSCOPIC ASSISTED VAGINAL HYSTERECTOMY WITH BILATERAL SALPINGECTOMY;  Surgeon: Brayton Mars, MD;  Location: [facility] ORS;  Service: Gynecology;  Laterality: Bilateral;  . TUBAL LIGATION  2011    Home Medications:  Allergies as of 09/30/2020      Reactions   Amoxicillin Rash, Other (See Comments)   GI upset      Medication List       Accurate as of September 30, 2020 12:56 PM. If you have any questions, ask your nurse or doctor.        STOP taking these medications   Unifine Pentips 32G X 4 MM Misc Generic drug: Insulin Pen Needle Stopped by: Reece Packer, MD     TAKE these medications   Cholecalciferol 1.25 MG (50000 UT) Tabs Take 1 tablet  by mouth once a week. For 8 weeks and then stop.  Return to office for lab draw.   Contrave 8-90 MG Tb12 Generic drug: Naltrexone-buPROPion HCl ER Start 1 tablet every morning for 7 days, then 1 tablet twice daily for 7 days, then 2 tablets every morning and one every evening for 7 days, then 2 tablets every morning and every evening by mouth (final dosing).   cyclobenzaprine 10 MG tablet Commonly known as: FLEXERIL Take 1 tablet (10 mg total) by mouth 3 (three) times daily as needed for muscle spasms.   fluconazole 150 MG tablet Commonly known as: DIFLUCAN Take 1 tablet (150 mg total) by mouth every three (3) days as needed.   metroNIDAZOLE 500 MG tablet Commonly known as: Flagyl Take 1 tablet (500 mg total) by mouth 2 (two) times daily. One po bid x 7 days       Allergies:  Allergies  Allergen Reactions  . Amoxicillin Rash and Other (See Comments)    GI upset    Family History: Family History  Problem Relation Age of Onset  . Mental illness Mother   . Diabetes Maternal Grandmother   . Heart disease Maternal Grandfather   . Cancer Neg Hx     Social History:  reports that she has been smoking cigarettes.  She has a 2.75 pack-year smoking history. She has never used smokeless tobacco. She reports current alcohol use. She reports that she does not use drugs.  ROS:                                        Physical Exam: LMP 11/22/2015 (Approximate)   Constitutional:  Alert and oriented, No acute distress. HEENT: [place] AT, moist mucus membranes.  Trachea midline, no masses. Cardiovascular: No clubbing, cyanosis, or edema. Respiratory: Normal respiratory effort, no increased work of breathing. GI: Abdomen is soft, nontender, nondistended, no abdominal masses GU: Mild grade 2 hypermobility the bladder neck and negative cough test Skin: No rashes, bruises or suspicious lesions. Lymph: No cervical or inguinal adenopathy. Neurologic: Grossly intact, no  focal deficits, moving all 4 extremities. Psychiatric: Normal mood and affect.  Laboratory Data: Lab Results  Component Value Date   WBC 5.8 12/28/2019   HGB 14.2 12/28/2019   HCT 42.0 12/28/2019   MCV 91 12/28/2019   PLT 278 12/28/2019    Lab Results  Component Value Date   CREATININE 0.78 12/28/2019    No results found for: PSA  No results found for: TESTOSTERONE  Lab Results  Component Value Date   HGBA1C 5.4 11/05/2016    Urinalysis    Component Value Date/Time   COLORURINE AMBER (A) 04/25/2017 1245   APPEARANCEUR Clear 12/28/2019 1530   LABSPEC 1.040 (H) 04/25/2017 1245   LABSPEC 1.006 04/24/2013 1110   PHURINE 6.0 04/25/2017 1245   GLUCOSEU Negative 12/28/2019 1530   GLUCOSEU Negative 04/24/2013 1110   HGBUR NEGATIVE 04/25/2017 1245   BILIRUBINUR Negative 12/28/2019 1530   BILIRUBINUR Negative 04/24/2013 1110   KETONESUR NEGATIVE 04/25/2017 1245   PROTEINUR Negative 12/28/2019 1530   PROTEINUR 30 (A) 04/25/2017 1245   UROBILINOGEN 4.0 (A) 10/30/2016 1358   NITRITE Negative 12/28/2019 1530   NITRITE NEGATIVE 04/25/2017 1245   LEUKOCYTESUR Negative 12/28/2019 1530   LEUKOCYTESUR Negative 04/24/2013 1110    Pertinent Imaging: Urine reviewed.  Chart reviewed.  Bladder scan 2 mL.  Urine sent for culture  Assessment & Plan: Patient has mild mixed incontinence.  Post void dribbling may be due to the overactive bladder component.  Call if urine culture positive.  Role of medical and physical therapy discussed.  Post void dribbling and sometimes difficult to treat discussed.  If she does not reach her goal I will order urodynamics and this was briefly discussed  Reassess in 5 to 6 weeks on new beta 3 agonist.  Physical therapy consultation given.  Stop medication in the future if does well with physical therapy  There are no diagnoses linked to this encounter.  No follow-ups on file.  Reece Packer, MD  [facility] [address] [place], [place] [address] [phone]

## 2020-10-05 LAB — CULTURE, URINE COMPREHENSIVE

## 2020-10-16 ENCOUNTER — Ambulatory Visit: Payer: No Typology Code available for payment source | Admitting: Nurse Practitioner

## 2020-10-16 NOTE — Progress Notes (Deleted)
   LMP 11/22/2015 (Approximate)    Subjective:    Patient ID: [name], female    DOB: 04-10-1986, 35 y.o.   MRN: [id number]  HPI: LATEYA DAURIA is a 35 y.o. female  No chief complaint on file.  WEIGHT LOSS  VITAMIN D DEFICIENCY   Relevant past medical, surgical, family and social history reviewed and updated as indicated. Interim medical history since our last visit reviewed. Allergies and medications reviewed and updated.  Review of Systems  Per HPI unless specifically indicated above     Objective:    LMP 11/22/2015 (Approximate)   Wt Readings from Last 3 Encounters:  09/30/20 214 lb (97.1 kg)  08/05/20 214 lb (97.1 kg)  04/08/20 208 lb 3.2 oz (94.4 kg)    Physical Exam  Results for orders placed or performed in visit on 09/30/20  CULTURE, URINE COMPREHENSIVE   Specimen: Urine   UR  Result Value Ref Range   Urine Culture, Comprehensive Final report    Organism ID, Bacteria Comment   Microscopic Examination   Urine  Result Value Ref Range   WBC, UA 0-5 0 - 5 /hpf   RBC None seen 0 - 2 /hpf   Epithelial Cells (non renal) 0-10 0 - 10 /hpf   Bacteria, UA None seen None seen/Few  Urinalysis, Complete  Result Value Ref Range   Specific Gravity, UA 1.025 1.005 - 1.030   pH, UA 7.0 5.0 - 7.5   Color, UA Yellow Yellow   Appearance Ur Clear Clear   Leukocytes,UA Negative Negative   Protein,UA Negative Negative/Trace   Glucose, UA Negative Negative   Ketones, UA Negative Negative   RBC, UA Negative Negative   Bilirubin, UA Negative Negative   Urobilinogen, Ur 0.2 0.2 - 1.0 mg/dL   Nitrite, UA Negative Negative   Microscopic Examination See below:   BLADDER SCAN AMB NON-IMAGING  Result Value Ref Range   Scan Result 3 ml       Assessment & Plan:   Problem List Items Addressed This Visit   None      Follow up plan: No follow-ups on file.

## 2020-11-01 ENCOUNTER — Telehealth: Payer: No Typology Code available for payment source | Admitting: Physician Assistant

## 2020-11-01 DIAGNOSIS — Z20822 Contact with and (suspected) exposure to covid-19: Secondary | ICD-10-CM | POA: Diagnosis not present

## 2020-11-01 MED ORDER — FLUTICASONE PROPIONATE 50 MCG/ACT NA SUSP: 2.0000 | Freq: Every day | NASAL | 0 refills | Status: DC

## 2020-11-01 MED ORDER — BENZONATATE 100 MG PO CAPS: 100.0000 mg | ORAL_CAPSULE | Freq: Three times a day (TID) | ORAL | 0 refills | Status: DC | PRN

## 2020-11-01 MED ORDER — ALBUTEROL SULFATE HFA 108 (90 BASE) MCG/ACT IN AERS: 2.0000 | INHALATION_SPRAY | Freq: Four times a day (QID) | RESPIRATORY_TRACT | 0 refills | Status: DC | PRN

## 2020-11-01 NOTE — Progress Notes (Signed)
I have spent 5 minutes in review of e-visit questionnaire, review and updating patient chart, medical decision making and response to patient.   Karin Pinedo Cody Jarah Pember, PA-C    

## 2020-11-01 NOTE — Progress Notes (Signed)
Message sent to patient requesting further input regarding current symptoms. Awaiting patient response.  

## 2020-11-01 NOTE — Progress Notes (Signed)
E-Visit for Corona Virus Screening  Your current symptoms could be consistent with the coronavirus. I am glad you have already been tested. We are enrolling you in our [facility] for [facility] . Daily you will receive a questionnaire within the [facility] website. Our COVID 19 response team will be monitoring your responses daily.  Please quarantine yourself while awaiting your test results. Please stay home for a minimum of 10 days from the first day of illness with improving symptoms and you have had 24 hours of no fever (without the use of Tylenol (Acetaminophen) Motrin (Ibuprofen) or any fever reducing medication).  Also - Do not get tested prior to returning to work because once you have had a positive test the test can stay positive for more than a month in some cases.   You should wear a mask or cloth face covering over your nose and mouth if you must be around other people or animals, including pets (even at home). Try to stay at least 6 feet away from other people. This will protect the people around you.  Please continue good preventive care measures, including:  frequent hand-washing, avoid touching your face, cover coughs/sneezes, stay out of crowds and keep a 6 foot distance from others.  COVID-19 is a respiratory illness with symptoms that are similar to the flu. Symptoms are typically mild to moderate, but there have been cases of severe illness and death due to the virus.   The following symptoms may appear 2-14 days after exposure: . Fever . Cough . Shortness of breath or difficulty breathing . Chills . Repeated shaking with chills . Muscle pain . Headache . Sore throat . New loss of taste or smell . Fatigue . Congestion or runny nose . Nausea or vomiting . Diarrhea  Go to the nearest hospital ED for assessment if fever/cough/breathlessness are severe or illness seems like a threat to life.  It is vitally important that if you feel that you have an infection such as  this virus or any other virus that you stay home and away from places where you may spread it to others.  You should avoid contact with people age 10 and older.   You can use medication such as prescription cough medication called Tessalon Perles 100 mg. You may take 1-2 capsules every 8 hours as needed for cough,  prescription inhaler called Albuterol MDI 90 mcg /actuation 2 puffs every 4 hours as needed for shortness of breath, wheezing, cough and prescription for Fluticasone nasal spray 2 sprays in each nostril one time per day  You may also take acetaminophen (Tylenol) as needed for fever.  Reduce your risk of any infection by using the same precautions used for avoiding the common cold or flu:  Marland Kitchen Wash your hands often with soap and warm water for at least 20 seconds.  If soap and water are not readily available, use an alcohol-based hand sanitizer with at least 60% alcohol.  . If coughing or sneezing, cover your mouth and nose by coughing or sneezing into the elbow areas of your shirt or coat, into a tissue or into your sleeve (not your hands). . Avoid shaking hands with others and consider head nods or verbal greetings only. . Avoid touching your eyes, nose, or mouth with unwashed hands.  . Avoid close contact with people who are sick. . Avoid places or events with large numbers of people in one location, like concerts or sporting events. . Carefully consider travel plans you  have or are making. . If you are planning any travel outside or inside the [place], visit the CDC's Travelers' Health webpage for the latest health notices. . If you have some symptoms but not all symptoms, continue to monitor at home and seek medical attention if your symptoms worsen. . If you are having a medical emergency, call 911.  HOME CARE . Only take medications as instructed by your medical team. . Drink plenty of fluids and get plenty of rest. . A steam or ultrasonic humidifier can help if you have congestion.    GET HELP RIGHT AWAY IF YOU HAVE EMERGENCY WARNING SIGNS** FOR COVID-19. If you or someone is showing any of these signs seek emergency medical care immediately. Call 911 or proceed to your closest emergency facility if: . You develop worsening high fever. . Trouble breathing . Bluish lips or face . Persistent pain or pressure in the chest . New confusion . Inability to wake or stay awake . You cough up blood. . Your symptoms become more severe  **This list is not all possible symptoms. Contact your medical provider for any symptoms that are sever or concerning to you.  MAKE SURE YOU   Understand these instructions.  Will watch your condition.  Will get help right away if you are not doing well or get worse.  Your e-visit answers were reviewed by a board certified advanced clinical practitioner to complete your personal care plan.  Depending on the condition, your plan could have included both over the counter or prescription medications.  If there is a problem please reply once you have received a response from your provider.  Your safety is important to [place].  If you have drug allergies check your prescription carefully.    You can use MyChart to ask questions about today's visit, request a non-urgent call back, or ask for a work or school excuse for 24 hours related to this e-Visit. If it has been greater than 24 hours you will need to follow up with your provider, or enter a new e-Visit to address those concerns. You will get an e-mail in the next two days asking about your experience.  I hope that your e-visit has been valuable and will speed your recovery. Thank you for using e-visits.

## 2020-11-02 ENCOUNTER — Telehealth: Payer: No Typology Code available for payment source | Admitting: Nurse Practitioner

## 2020-11-02 DIAGNOSIS — J069 Acute upper respiratory infection, unspecified: Secondary | ICD-10-CM

## 2020-11-02 MED ORDER — PROMETHAZINE-DM 6.25-15 MG/5ML PO SYRP: 5.0000 mL | ORAL_SOLUTION | Freq: Four times a day (QID) | ORAL | 0 refills | Status: DC | PRN

## 2020-11-02 NOTE — Progress Notes (Signed)
We are sorry you are not feeling well.  Here is how we plan to help!  Based on what you have shared with me, it looks like you may have a viral upper respiratory infection.  Upper respiratory infections are caused by a large number of viruses; however, rhinovirus is the most common cause.   Symptoms vary from person to person, with common symptoms including sore throat, cough, fatigue or lack of energy and feeling of general discomfort.  A low-grade fever of up to 100.4 may present, but is often uncommon.  Symptoms vary however, and are closely related to a person's age or underlying illnesses.  The most common symptoms associated with an upper respiratory infection are nasal discharge or congestion, cough, sneezing, headache and pressure in the ears and face.  These symptoms usually persist for about 3 to 10 days, but can last up to 2 weeks.  It is important to know that upper respiratory infections do not cause serious illness or complications in most cases.    Upper respiratory infections can be transmitted from person to person, with the most common method of transmission being a person's hands.  The virus is able to live on the skin and can infect other persons for up to 2 hours after direct contact.  Also, these can be transmitted when someone coughs or sneezes; thus, it is important to cover the mouth to reduce this risk.  To keep the spread of the illness at [place], good hand hygiene is very important.  This is an infection that is most likely caused by a virus. There are no specific treatments other than to help you with the symptoms until the infection runs its course.  We are sorry you are not feeling well.  Here is how we plan to help!   For nasal congestion, you may use an oral decongestants such as Mucinex D or if you have glaucoma or high blood pressure use plain Mucinex.  Saline nasal spray or nasal drops can help and can safely be used as often as needed for congestion.  For your congestion,  I have prescribed take flonase as prescribed  If you do not have a history of heart disease, hypertension, diabetes or thyroid disease, prostate/bladder issues or glaucoma, you may also use Sudafed to treat nasal congestion.  It is highly recommended that you consult with a pharmacist or your primary care physician to ensure this medication is safe for you to take.     If you have a cough, you may use cough suppressants such as Delsym and Robitussin.  If you have glaucoma or high blood pressure, you can also use Coricidin HBP.   For cough I have prescribed for you phenergan dextrmethorphan for your cough. It will take a few more days to resolve.  If you have a sore or scratchy throat, use a saltwater gargle-  to  teaspoon of salt dissolved in a 4-ounce to 8-ounce glass of warm water.  Gargle the solution for approximately 15-30 seconds and then spit.  It is important not to swallow the solution.  You can also use throat lozenges/cough drops and Chloraseptic spray to help with throat pain or discomfort.  Warm or cold liquids can also be helpful in relieving throat pain.  For headache, pain or general discomfort, you can use Ibuprofen or Tylenol as directed.   Some authorities believe that zinc sprays or the use of Echinacea may shorten the course of your symptoms.   HOME CARE . Only  take medications as instructed by your medical team. . Be sure to drink plenty of fluids. Water is fine as well as fruit juices, sodas and electrolyte beverages. You may want to stay away from caffeine or alcohol. If you are nauseated, try taking small sips of liquids. How do you know if you are getting enough fluid? Your urine should be a pale yellow or almost colorless. . Get rest. . Taking a steamy shower or using a humidifier may help nasal congestion and ease sore throat pain. You can place a towel over your head and breathe in the steam from hot water coming from a faucet. . Using a saline nasal spray works much  the same way. . Cough drops, hard candies and sore throat lozenges may ease your cough. . Avoid close contacts especially the very young and the elderly . Cover your mouth if you cough or sneeze . Always remember to wash your hands.   GET HELP RIGHT AWAY IF: . You develop worsening fever. . If your symptoms do not improve within 10 days . You develop yellow or green discharge from your nose over 3 days. . You have coughing fits . You develop a severe head ache or visual changes. . You develop shortness of breath, difficulty breathing or start having chest pain . Your symptoms persist after you have completed your treatment plan  MAKE SURE YOU   Understand these instructions.  Will watch your condition.  Will get help right away if you are not doing well or get worse.  Your e-visit answers were reviewed by a board certified advanced clinical practitioner to complete your personal care plan. Depending upon the condition, your plan could have included both over the counter or prescription medications. Please review your pharmacy choice. If there is a problem, you may call our nursing hot line at and have the prescription routed to another pharmacy. Your safety is important to [place]. If you have drug allergies check your prescription carefully.   You can use MyChart to ask questions about today's visit, request a non-urgent call back, or ask for a work or school excuse for 24 hours related to this e-Visit. If it has been greater than 24 hours you will need to follow up with your provider, or enter a new e-Visit to address those concerns. You will get an e-mail in the next two days asking about your experience.  I hope that your e-visit has been valuable and will speed your recovery. Thank you for using e-visits.   5-10 minutes spent reviewing and documenting in chart.

## 2020-11-03 ENCOUNTER — Encounter (INDEPENDENT_AMBULATORY_CARE_PROVIDER_SITE_OTHER): Payer: Self-pay

## 2020-11-04 ENCOUNTER — Encounter: Payer: Self-pay | Admitting: Nurse Practitioner

## 2020-11-04 ENCOUNTER — Telehealth: Payer: Self-pay | Admitting: *Deleted

## 2020-11-04 ENCOUNTER — Telehealth (INDEPENDENT_AMBULATORY_CARE_PROVIDER_SITE_OTHER): Payer: No Typology Code available for payment source | Admitting: Nurse Practitioner

## 2020-11-04 DIAGNOSIS — J069 Acute upper respiratory infection, unspecified: Secondary | ICD-10-CM | POA: Diagnosis not present

## 2020-11-04 MED ORDER — IBUPROFEN 800 MG PO TABS: 800.0000 mg | ORAL_TABLET | Freq: Three times a day (TID) | ORAL | 0 refills | Status: DC | PRN

## 2020-11-04 MED ORDER — METHYLPREDNISOLONE 4 MG PO TBPK: ORAL_TABLET | ORAL | 0 refills | Status: DC

## 2020-11-04 NOTE — Telephone Encounter (Signed)
BPA triggered  for worsening weakness. Attempted to reach patient, left VM symptoms would be addressed in reply. Advised to call  for any questions. Will address in questionnaire reply.

## 2020-11-04 NOTE — Progress Notes (Signed)
LMP 11/22/2015 (Approximate)    Subjective:    Patient ID: Laura [name], female    DOB: [birth year], 35 y.o.   MRN: [id number]  HPI: Laura [name] is a 35 y.o. female  Chief Complaint  Patient presents with  . URI    X 8 days, two negative covid test. Started as sore throat and sinus pressure now she has sinus pressure ear fullness cough and congestion   UPPER RESPIRATORY TRACT INFECTION Worst symptom: sinus pressure, congestion Fever: no Cough: yes Shortness of breath: gets winded when she is moving around a lot. Wheezing: no Chest pain: no Chest tightness: no Chest congestion: no Nasal congestion: no Runny nose: no Post nasal drip: no Sneezing: no Sore throat: yes Swollen glands: yes on left side Sinus pressure: yes Headache: no Face pain: yes Toothache: no Ear pain: no bilateral Ear pressure: yes bilateral Eyes red/itching:no Eye drainage/crusting: no  Vomiting: no Rash: no Fatigue: yes Sick contacts: no Strep contacts: no  Context: stable Recurrent sinusitis: no Relief with OTC cold/cough medications: no  Treatments attempted: none, anti-histamine and pseudoephedrine   DEPRESSION   Relevant past medical, surgical, family and social history reviewed and updated as indicated. Interim medical history since our last visit reviewed. Allergies and medications reviewed and updated.  Review of Systems  Constitutional: Positive for fatigue. Negative for fever.  HENT: Positive for congestion, sinus pressure and sore throat. Negative for dental problem, ear pain, postnasal drip, rhinorrhea, sinus pain and sneezing.   Respiratory: Positive for cough. Negative for shortness of breath and wheezing.   Cardiovascular: Negative for chest pain.  Gastrointestinal: Negative for vomiting.  Skin: Negative for rash.  Neurological: Negative for headaches.    Per HPI unless specifically indicated above     Objective:    LMP 11/22/2015 (Approximate)   Wt  Readings from Last 3 Encounters:  09/30/20 214 lb (97.1 kg)  08/05/20 214 lb (97.1 kg)  04/08/20 208 lb 3.2 oz (94.4 kg)    Physical Exam Vitals and nursing note reviewed.  Constitutional:      General: She is not in acute distress.    Appearance: She is not ill-appearing.  HENT:     Head: Normocephalic.     Right Ear: Hearing normal.     Left Ear: Hearing normal.     Nose: Nose normal.  Pulmonary:     Effort: Pulmonary effort is normal. No respiratory distress.  Neurological:     Mental Status: She is alert.  Psychiatric:        Mood and Affect: Mood normal.        Behavior: Behavior normal.        Thought Content: Thought content normal.        Judgment: Judgment normal.     Results for orders placed or performed in visit on 09/30/20  CULTURE, URINE COMPREHENSIVE   Specimen: Urine   UR  Result Value Ref Range   Urine Culture, Comprehensive Final report    Organism ID, Bacteria Comment   Microscopic Examination   Urine  Result Value Ref Range   WBC, UA 0-5 0 - 5 /hpf   RBC None seen 0 - 2 /hpf   Epithelial Cells (non renal) 0-10 0 - 10 /hpf   Bacteria, UA None seen None seen/Few  Urinalysis, Complete  Result Value Ref Range   Specific Gravity, UA 1.025 1.005 - 1.030   pH, UA 7.0 5.0 - 7.5   Color, UA Yellow Yellow  Appearance Ur Clear Clear   Leukocytes,UA Negative Negative   Protein,UA Negative Negative/Trace   Glucose, UA Negative Negative   Ketones, UA Negative Negative   RBC, UA Negative Negative   Bilirubin, UA Negative Negative   Urobilinogen, Ur 0.2 0.2 - 1.0 mg/dL   Nitrite, UA Negative Negative   Microscopic Examination See below:   BLADDER SCAN AMB NON-IMAGING  Result Value Ref Range   Scan Result 3 ml       Assessment & Plan:   Problem List Items Addressed This Visit   None   Visit Diagnoses    Upper respiratory virus    -  Primary   Complete course of steroids.  Use Ibuprofen PRN for sinus pain and pressure. Letter given for work.   Follow up if symptoms do not improve.       Follow up plan: Return if symptoms worsen or fail to improve.    This visit was completed via MyChart due to the restrictions of the COVID-19 pandemic. All issues as above were discussed and addressed. Physical exam was done as above through visual confirmation on MyChart. If it was felt that the patient should be evaluated in the office, they were directed there. The patient verbally consented to this visit. 1. Location of the patient: Home 2. Location of the provider: Office2 3. Those involved with this call:  ? Provider: Jon Billings, NP ? CMA: Tiffany Reel, CMA ? Front Desk/Registration: [facility] 4. Time spent on call: 15 minutes with patient face to face via video conference. More than 50% of this time was spent in counseling and coordination of care. 20 minutes total spent in review of patient's record and preparation of their chart.

## 2020-11-05 ENCOUNTER — Encounter: Payer: Self-pay | Admitting: Nurse Practitioner

## 2020-11-06 ENCOUNTER — Telehealth: Payer: Self-pay

## 2020-11-06 NOTE — Telephone Encounter (Signed)
Left message to call back in regard to questionnaire.

## 2020-11-11 ENCOUNTER — Ambulatory Visit: Payer: No Typology Code available for payment source | Admitting: Urology

## 2020-11-12 ENCOUNTER — Encounter: Payer: Self-pay | Admitting: Urology

## 2020-11-18 ENCOUNTER — Telehealth: Payer: No Typology Code available for payment source | Admitting: Physician Assistant

## 2020-11-18 ENCOUNTER — Other Ambulatory Visit: Payer: Self-pay

## 2020-11-18 DIAGNOSIS — N760 Acute vaginitis: Secondary | ICD-10-CM

## 2020-11-18 DIAGNOSIS — B9689 Other specified bacterial agents as the cause of diseases classified elsewhere: Secondary | ICD-10-CM | POA: Diagnosis not present

## 2020-11-18 DIAGNOSIS — N390 Urinary tract infection, site not specified: Secondary | ICD-10-CM | POA: Diagnosis not present

## 2020-11-18 MED ORDER — METRONIDAZOLE 500 MG PO TABS
500.0000 mg | ORAL_TABLET | Freq: Two times a day (BID) | ORAL | 0 refills | Status: DC
Start: 2020-11-18 — End: 2020-12-06
  Filled 2020-11-18: qty 14, 7d supply, fill #0

## 2020-11-18 MED ORDER — CEPHALEXIN 500 MG PO CAPS
ORAL_CAPSULE | ORAL | 0 refills | Status: DC
  Filled 2020-11-18: qty 14, 7d supply, fill #0

## 2020-11-18 NOTE — Progress Notes (Signed)
We are sorry that you are not feeling well. Here is how we plan to help! Based on what you shared with me it looks like you: May have a vaginosis due to bacteria   Given your symptoms and urine dipstick I am very concerned about a UTI in addition to your BV.  Many women experience both together. I am going to prescribe 2 antibiotics - one for the BV and one for a possible UTI.  Please take both as directed until completed.  Vaginosis is an inflammation of the vagina that can result in discharge, itching and pain. The cause is usually a change in the normal balance of vaginal bacteria or an infection. Vaginosis can also result from reduced estrogen levels after menopause.  The most common causes of vaginosis are:   Bacterial vaginosis which results from an overgrowth of one on several organisms that are normally present in your vagina.   Yeast infections which are caused by a naturally occurring fungus called candida.   Vaginal atrophy (atrophic vaginosis) which results from the thinning of the vagina from reduced estrogen levels after menopause.   Trichomoniasis which is caused by a parasite and is commonly transmitted by sexual intercourse.  Factors that increase your risk of developing vaginosis include: Marland Kitchen Medications, such as antibiotics and steroids . Uncontrolled diabetes . Use of hygiene products such as bubble bath, vaginal spray or vaginal deodorant . Douching . Wearing damp or tight-fitting clothing . Using an intrauterine device (IUD) for birth control . Hormonal changes, such as those associated with pregnancy, birth control pills or menopause . Sexual activity . Having a sexually transmitted infection  Your treatment plan is Metronidazole or Flagyl 500mg  twice a day for 7 days.  I have electronically sent this prescription into the pharmacy that you have chosen. and Keflex BID x 7 days.  Be sure to take all of the medication as directed. Stop taking any medication if you  develop a rash, tongue swelling or shortness of breath. Mothers who are breast feeding should consider pumping and discarding their breast milk while on these antibiotics. However, there is no consensus that infant exposure at these doses would be harmful.  Remember that medication creams can weaken latex condoms. Marland Kitchen   HOME CARE:  Good hygiene may prevent some types of vaginosis from recurring and may relieve some symptoms:  . Avoid baths, hot tubs and whirlpool spas. Rinse soap from your outer genital area after a shower, and dry the area well to prevent irritation. Don't use scented or harsh soaps, such as those with deodorant or antibacterial action. Marland Kitchen Avoid irritants. These include scented tampons and pads. . Wipe from front to back after using the toilet. Doing so avoids spreading fecal bacteria to your vagina.  Other things that may help prevent vaginosis include:  Marland Kitchen Don't douche. Your vagina doesn't require cleansing other than normal bathing. Repetitive douching disrupts the normal organisms that reside in the vagina and can actually increase your risk of vaginal infection. Douching won't clear up a vaginal infection. . Use a latex condom. Both female and female latex condoms may help you avoid infections spread by sexual contact. . Wear cotton underwear. Also wear pantyhose with a cotton crotch. If you feel comfortable without it, skip wearing underwear to bed. Yeast thrives in [employer] Your symptoms should improve in the next day or two.  GET HELP RIGHT AWAY IF:  . You have pain in your lower abdomen ( pelvic area or over your  ovaries) . You develop nausea or vomiting . You develop a fever . Your discharge changes or worsens . You have persistent pain with intercourse . You develop shortness of breath, a rapid pulse, or you faint.  These symptoms could be signs of problems or infections that need to be evaluated by a medical provider now.  MAKE SURE YOU     Understand these instructions.  Will watch your condition.  Will get help right away if you are not doing well or get worse.  Your e-visit answers were reviewed by a board certified advanced clinical practitioner to complete your personal care plan. Depending upon the condition, your plan could have included both over the counter or prescription medications. Please review your pharmacy choice to make sure that you have choses a pharmacy that is open for you to pick up any needed prescription, Your safety is important to [place]. If you have drug allergies check your prescription carefully.   You can use MyChart to ask questions about today's visit, request a non-urgent call back, or ask for a work or school excuse for 24 hours related to this e-Visit. If it has been greater than 24 hours you will need to follow up with your provider, or enter a new e-Visit to address those concerns. You will get a MyChart message within the next two days asking about your experience. I hope that your e-visit has been valuable and will speed your recovery.  Greater than 5 minutes, yet less than 10 minutes of time have been spent researching, coordinating, and implementing care for this patient today

## 2020-11-27 ENCOUNTER — Other Ambulatory Visit: Payer: Self-pay

## 2020-11-27 DIAGNOSIS — Z20822 Contact with and (suspected) exposure to covid-19: Secondary | ICD-10-CM

## 2020-11-27 MED ORDER — FLUTICASONE PROPIONATE 50 MCG/ACT NA SUSP: 2.0000 | Freq: Every day | NASAL | 0 refills | Status: DC

## 2020-11-27 MED ORDER — ALBUTEROL SULFATE HFA 108 (90 BASE) MCG/ACT IN AERS
2.0000 | INHALATION_SPRAY | Freq: Four times a day (QID) | RESPIRATORY_TRACT | 0 refills | Status: DC | PRN
Start: 2020-11-27 — End: 2021-03-11

## 2020-11-29 ENCOUNTER — Other Ambulatory Visit: Payer: Self-pay

## 2020-11-29 MED FILL — Cholecalciferol Cap 1.25 MG (50000 Unit): ORAL | 56 days supply | Qty: 8 | Fill #0 | Status: AC

## 2020-12-05 ENCOUNTER — Other Ambulatory Visit (HOSPITAL_COMMUNITY): Payer: Self-pay

## 2020-12-06 ENCOUNTER — Other Ambulatory Visit: Payer: Self-pay

## 2020-12-06 ENCOUNTER — Encounter: Payer: Self-pay | Admitting: Nurse Practitioner

## 2020-12-06 ENCOUNTER — Ambulatory Visit (INDEPENDENT_AMBULATORY_CARE_PROVIDER_SITE_OTHER): Payer: No Typology Code available for payment source | Admitting: Nurse Practitioner

## 2020-12-06 VITALS — BP 120/87 | HR 98 | Temp 98.1°F | Wt 210.6 lb

## 2020-12-06 DIAGNOSIS — E6609 Other obesity due to excess calories: Secondary | ICD-10-CM | POA: Diagnosis not present

## 2020-12-06 DIAGNOSIS — F4310 Post-traumatic stress disorder, unspecified: Secondary | ICD-10-CM

## 2020-12-06 DIAGNOSIS — F419 Anxiety disorder, unspecified: Secondary | ICD-10-CM

## 2020-12-06 DIAGNOSIS — Z6839 Body mass index (BMI) 39.0-39.9, adult: Secondary | ICD-10-CM

## 2020-12-06 DIAGNOSIS — F331 Major depressive disorder, recurrent, moderate: Secondary | ICD-10-CM | POA: Diagnosis not present

## 2020-12-06 MED ORDER — PROPRANOLOL HCL 10 MG PO TABS
10.0000 mg | ORAL_TABLET | Freq: Two times a day (BID) | ORAL | 4 refills | Status: DC | PRN
  Filled 2020-12-06: qty 60, 30d supply, fill #0

## 2020-12-06 MED ORDER — BUPROPION HCL ER (XL) 300 MG PO TB24
ORAL_TABLET | Freq: Every day | ORAL | 4 refills | Status: DC
  Filled 2020-12-06: qty 90, 90d supply, fill #0
  Filled 2021-06-10: qty 90, 90d supply, fill #1

## 2020-12-06 NOTE — Assessment & Plan Note (Signed)
Refer to depression plan of care for details.

## 2020-12-06 NOTE — Assessment & Plan Note (Signed)
Chronic, exacerbated.  Denies SI/HI.  She stopped Wellbutrin two months ago.  This worked well for her, will restart this today.  Add on Propranolol 10 MG BID as needed for panic episodes with HR elevation, this will benefit the tachycardia with anxiety and mood.  Has taken short burst of Xanax in past per her report, but will defer this for now -- educated on risks.  Referral to psychiatry placed, would benefit from return due to past history and multiple past medications used.  Recommend she look into [facility] through her workplace and start therapy ASAP.  Return in 4 weeks for follow-up, sooner if any worsening mood.

## 2020-12-06 NOTE — Assessment & Plan Note (Signed)
BMI 39.20.  Recommended eating smaller high protein, low fat meals more frequently and exercising 30 mins a day 5 times a week with a goal of 10-15lb weight loss in the next 3 months. Patient voiced their understanding and motivation to adhere to these recommendations.
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
## 2020-12-06 NOTE — Assessment & Plan Note (Signed)
Refer to depression plan of care for further details.

## 2020-12-06 NOTE — Patient Instructions (Signed)

## 2021-01-03 ENCOUNTER — Ambulatory Visit: Payer: No Typology Code available for payment source | Admitting: Nurse Practitioner

## 2021-01-10 ENCOUNTER — Other Ambulatory Visit: Payer: Self-pay

## 2021-01-10 ENCOUNTER — Ambulatory Visit (INDEPENDENT_AMBULATORY_CARE_PROVIDER_SITE_OTHER): Payer: No Typology Code available for payment source | Admitting: Nurse Practitioner

## 2021-01-10 ENCOUNTER — Encounter: Payer: Self-pay | Admitting: Nurse Practitioner

## 2021-01-10 VITALS — BP 132/90 | HR 88 | Temp 97.9°F | Wt 212.6 lb

## 2021-01-10 DIAGNOSIS — R000 Tachycardia, unspecified: Secondary | ICD-10-CM

## 2021-01-10 DIAGNOSIS — G4700 Insomnia, unspecified: Secondary | ICD-10-CM

## 2021-01-10 DIAGNOSIS — F331 Major depressive disorder, recurrent, moderate: Secondary | ICD-10-CM | POA: Diagnosis not present

## 2021-01-10 DIAGNOSIS — F419 Anxiety disorder, unspecified: Secondary | ICD-10-CM

## 2021-01-10 MED ORDER — VENLAFAXINE HCL ER 37.5 MG PO CP24
37.5000 mg | ORAL_CAPSULE | Freq: Every day | ORAL | 0 refills | Status: DC
  Filled 2021-01-10: qty 30, 30d supply, fill #0

## 2021-01-10 NOTE — Assessment & Plan Note (Signed)
Chronic. Ongoing.  Continue with Wellbutrin 300mg  daily.  Will add Effexor 37.5mg  daily.  Side effects and benefits discussed with patient during visit.  Follow up in 1 month for reevaluation.  Call sooner if concerns arise.  Patient has not heard about referral to Psychiatry yet.  Will follow up with referral coordinator to see what is happening with referral.

## 2021-01-10 NOTE — Progress Notes (Signed)
BP 132/90   Pulse 88   Temp 97.9 F (36.6 C)   Wt 212 lb 9.6 oz (96.4 kg)   LMP 11/22/2015 (Approximate)   SpO2 99%   BMI 39.58 kg/m    Subjective:    Patient ID: Laura [name], female    DOB: 26-Mar-1986, 35 y.o.   MRN: [id number]  HPI: Laura [name] is a 35 y.o. female  Chief Complaint  Patient presents with   Anxiety   Insomnia   Referral    Patient states she would like referral to cardiology due to heart rate being elevated at times    DEPRESSION/ANXIETY Patient states she is tolerating the Wellbutrin well.  Patient feels like it is somewhat helping her mood.  Patient feels like her anxiety is worse. Denies SI.  [place] Office Visit from 01/10/2021 in [facility]  PHQ-9 Total Score 16      GAD 7 : Generalized Anxiety Score 01/10/2021 12/06/2020 02/19/2020 01/18/2020  Nervous, Anxious, on Edge 3 3 3 3   Control/stop worrying 3 3 3 3   Worry too much - different things 3 3 3 3   Trouble relaxing 3 3 3 3   Restless 0 3 0 1  Easily annoyed or irritable 1 3 3 3   Afraid - awful might happen 1 1 0 1  Total GAD 7 Score 14 19 15 17   Anxiety Difficulty Somewhat difficult Extremely difficult Very difficult Extremely difficult      ELEVATED HEART RATE Patient has been keeping track of her heart rate and it is running in the 90s-100s.  She has been taking the propranolol daily which is helping to bring the HR down.  Patient feels like when her HR is elevated she is having chest pain.   Relevant past medical, surgical, family and social history reviewed and updated as indicated. Interim medical history since our last visit reviewed. Allergies and medications reviewed and updated.  Review of Systems  Cardiovascular:  Positive for chest pain.       Elevated HR  Psychiatric/Behavioral:  Positive for dysphoric mood. Negative for suicidal ideas. The patient is nervous/anxious.    Per HPI unless specifically indicated above     Objective:    BP  132/90   Pulse 88   Temp 97.9 F (36.6 C)   Wt 212 lb 9.6 oz (96.4 kg)   LMP 11/22/2015 (Approximate)   SpO2 99%   BMI 39.58 kg/m   Wt Readings from Last 3 Encounters:  01/10/21 212 lb 9.6 oz (96.4 kg)  12/06/20 210 lb 9.6 oz (95.5 kg)  09/30/20 214 lb (97.1 kg)    Physical Exam Vitals and nursing note reviewed.  Constitutional:      General: She is not in acute distress.    Appearance: Normal appearance. She is normal weight. She is not ill-appearing, toxic-appearing or diaphoretic.  HENT:     Head: Normocephalic.     Right Ear: External ear normal.     Left Ear: External ear normal.     Nose: Nose normal.     Mouth/Throat:     Mouth: Mucous membranes are moist.     Pharynx: Oropharynx is clear.  Eyes:     General:        Right eye: No discharge.        Left eye: No discharge.     Extraocular Movements: Extraocular movements intact.     Conjunctiva/sclera: Conjunctivae normal.     Pupils: Pupils are equal, round, and  reactive to light.  Cardiovascular:     Rate and Rhythm: Normal rate and regular rhythm.     Heart sounds: No murmur heard. Pulmonary:     Effort: Pulmonary effort is normal. No respiratory distress.     Breath sounds: Normal breath sounds. No wheezing or rales.  Musculoskeletal:     Cervical back: Normal range of motion and neck supple.  Skin:    General: Skin is warm and dry.     Capillary Refill: Capillary refill takes less than 2 seconds.  Neurological:     General: No focal deficit present.     Mental Status: She is alert and oriented to person, place, and time. Mental status is at baseline.  Psychiatric:        Mood and Affect: Mood normal.        Behavior: Behavior normal.        Thought Content: Thought content normal.        Judgment: Judgment normal.    Results for orders placed or performed in visit on 09/30/20  CULTURE, URINE COMPREHENSIVE   Specimen: Urine   UR  Result Value Ref Range   Urine Culture, Comprehensive Final report     Organism ID, Bacteria Comment   Microscopic Examination   Urine  Result Value Ref Range   WBC, UA 0-5 0 - 5 /hpf   RBC None seen 0 - 2 /hpf   Epithelial Cells (non renal) 0-10 0 - 10 /hpf   Bacteria, UA None seen None seen/Few  Urinalysis, Complete  Result Value Ref Range   Specific Gravity, UA 1.025 1.005 - 1.030   pH, UA 7.0 5.0 - 7.5   Color, UA Yellow Yellow   Appearance Ur Clear Clear   Leukocytes,UA Negative Negative   Protein,UA Negative Negative/Trace   Glucose, UA Negative Negative   Ketones, UA Negative Negative   RBC, UA Negative Negative   Bilirubin, UA Negative Negative   Urobilinogen, Ur 0.2 0.2 - 1.0 mg/dL   Nitrite, UA Negative Negative   Microscopic Examination See below:   BLADDER SCAN AMB NON-IMAGING  Result Value Ref Range   Scan Result 3 ml       Assessment & Plan:   Problem List Items Addressed This Visit       Other   Depression    Chronic. Ongoing.  Continue with Wellbutrin 300mg  daily.  Will add Effexor 37.5mg  daily.  Side effects and benefits discussed with patient during visit.  Follow up in 1 month for reevaluation.  Call sooner if concerns arise.  Patient has not heard about referral to Psychiatry yet.  Will follow up with referral coordinator to see what is happening with referral.        Relevant Medications   venlafaxine XR (EFFEXOR XR) 37.5 MG 24 hr capsule   Anxiety    Chronic. Ongoing.  Continue with Wellbutrin 300mg  daily.  Will add Effexor 37.5mg  daily.  Side effects and benefits discussed with patient during visit.  Follow up in 1 month for reevaluation.  Call sooner if concerns arise.  Patient has not heard about referral to Psychiatry yet.  Will follow up with referral coordinator to see what is happening with referral.        Relevant Medications   venlafaxine XR (EFFEXOR XR) 37.5 MG 24 hr capsule   Insomnia - Primary    Trazodone did not work well for patient. Recommend using benadryl as sleep aid. Follow up in 1 month.  Other Visit Diagnoses     Tachycardia       Continue with Propranolol. Referral placed for patient to see Cardiology.    Relevant Orders   Ambulatory referral to Cardiology        Follow up plan: Return in about 1 month (around 02/10/2021) for Depression/Anxiety FU (can be virtual).

## 2021-01-10 NOTE — Assessment & Plan Note (Signed)
Trazodone did not work well for patient. Recommend using benadryl as sleep aid. Follow up in 1 month.

## 2021-02-19 ENCOUNTER — Telehealth: Payer: No Typology Code available for payment source | Admitting: Nurse Practitioner

## 2021-02-19 NOTE — Progress Notes (Deleted)
   LMP 11/22/2015 (Approximate)    Subjective:    Patient ID: Laura [name], female    DOB: Mar 22, 1986, 35 y.o.   MRN: [id number]  HPI: Laura [name] is a 35 y.o. female  No chief complaint on file.  ANXIETY/DEPRESSION  Relevant past medical, surgical, family and social history reviewed and updated as indicated. Interim medical history since our last visit reviewed. Allergies and medications reviewed and updated.  Review of Systems  Per HPI unless specifically indicated above     Objective:    LMP 11/22/2015 (Approximate)   Wt Readings from Last 3 Encounters:  01/10/21 212 lb 9.6 oz (96.4 kg)  12/06/20 210 lb 9.6 oz (95.5 kg)  09/30/20 214 lb (97.1 kg)    Physical Exam  Results for orders placed or performed in visit on 09/30/20  CULTURE, URINE COMPREHENSIVE   Specimen: Urine   UR  Result Value Ref Range   Urine Culture, Comprehensive Final report    Organism ID, Bacteria Comment   Microscopic Examination   Urine  Result Value Ref Range   WBC, UA 0-5 0 - 5 /hpf   RBC None seen 0 - 2 /hpf   Epithelial Cells (non renal) 0-10 0 - 10 /hpf   Bacteria, UA None seen None seen/Few  Urinalysis, Complete  Result Value Ref Range   Specific Gravity, UA 1.025 1.005 - 1.030   pH, UA 7.0 5.0 - 7.5   Color, UA Yellow Yellow   Appearance Ur Clear Clear   Leukocytes,UA Negative Negative   Protein,UA Negative Negative/Trace   Glucose, UA Negative Negative   Ketones, UA Negative Negative   RBC, UA Negative Negative   Bilirubin, UA Negative Negative   Urobilinogen, Ur 0.2 0.2 - 1.0 mg/dL   Nitrite, UA Negative Negative   Microscopic Examination See below:   BLADDER SCAN AMB NON-IMAGING  Result Value Ref Range   Scan Result 3 ml       Assessment & Plan:   Problem List Items Addressed This Visit       Other   Depression - Primary   Anxiety     Follow up plan: No follow-ups on file.  This visit was completed via MyChart due to the restrictions of the  COVID-19 pandemic. All issues as above were discussed and addressed. Physical exam was done as above through visual confirmation on MyChart. If it was felt that the patient should be evaluated in the office, they were directed there. The patient verbally consented to this visit. Location of the patient: *** Location of the provider: Office Those involved with this call:  Provider: Jon Billings, NP CMA: *** Front Desk/Registration: *** This encounter was conducted via ***.  I spent *** dedicated to the care of this patient on the date of this encounter to include previsit review of ***, face to face time with the patient, and post visit ordering of testing.

## 2021-03-11 ENCOUNTER — Other Ambulatory Visit: Payer: Self-pay

## 2021-03-11 ENCOUNTER — Telehealth: Payer: No Typology Code available for payment source | Admitting: Physician Assistant

## 2021-03-11 DIAGNOSIS — Z20822 Contact with and (suspected) exposure to covid-19: Secondary | ICD-10-CM | POA: Diagnosis not present

## 2021-03-11 MED ORDER — ALBUTEROL SULFATE HFA 108 (90 BASE) MCG/ACT IN AERS
2.0000 | INHALATION_SPRAY | Freq: Four times a day (QID) | RESPIRATORY_TRACT | 0 refills | Status: DC | PRN
Start: 2021-03-11 — End: 2022-07-28
  Filled 2021-03-11: qty 8.5, 30d supply, fill #0

## 2021-03-11 MED ORDER — PROMETHAZINE-DM 6.25-15 MG/5ML PO SYRP
5.0000 mL | ORAL_SOLUTION | Freq: Four times a day (QID) | ORAL | 0 refills | Status: DC | PRN
Start: 2021-03-11 — End: 2021-03-24
  Filled 2021-03-11: qty 118, 6d supply, fill #0

## 2021-03-11 NOTE — Progress Notes (Signed)
E-Visit for Corona Virus Screening  Your current symptoms could be consistent with the coronavirus.  Many health care providers can now test patients at their office but not all are.  [place] has multiple testing sites. For information on our [place] testing locations and hours go to [email or web address]  We are enrolling you in our [place] for [place] . Daily you will receive a questionnaire within the [place] website. Our COVID 19 response team will be monitoring your responses daily.  Testing Information: The COVID-19 Community Testing sites are testing BY APPOINTMENT ONLY.  You can schedule online at [email or web address]  If you do not have access to a smart phone or computer you may call [phone] for an appointment.   Additional testing sites in the Community:  For [facility] Testing sites in   FaceUpdate.[email or web address]  For Pop-up testing sites in [place]  [email or web address].[email or web address]  For [facility] [email or web address]  For [facility] testing in [place] and [employer] [email or web address]  For [facility] testing in [place]   [email or web address]  For  more information about community testing call [phone]   Please quarantine yourself while awaiting your test results. Please stay home for a minimum of 10 days from the first day of illness with improving symptoms and you have had 24 hours of no fever (without the use of Tylenol (Acetaminophen) Motrin (Ibuprofen) or any fever reducing medication).  Also - Do not get tested prior to returning to work because once you have had a positive test the test can stay positive for  more than a month in some cases.   You should wear a mask or cloth face covering over your nose and mouth if you must be around other people or animals, including pets (even at home). Try to stay at least 6 feet away from other people. This will protect the people around you.  Please continue good preventive care measures, including:  frequent hand-washing, avoid touching your face, cover coughs/sneezes, stay out of crowds and keep a 6 foot distance from others.  COVID-19 is a respiratory illness with symptoms that are similar to the flu. Symptoms are typically mild to moderate, but there have been cases of severe illness and death due to the virus.   The following symptoms may appear 2-14 days after exposure: Fever Cough Shortness of breath or difficulty breathing Chills Repeated shaking with chills Muscle pain Headache Sore throat New loss of taste or smell Fatigue Congestion or runny nose Nausea or vomiting Diarrhea  Go to the nearest hospital ED for assessment if fever/cough/breathlessness are severe or illness seems like a threat to life.  It is vitally important that if you feel that you have an infection such as this virus or any other virus that you stay home and away from places where you may spread it to others.  You should avoid contact with people age 30 and older.   You can use medication such as  prescription inhaler called Albuterol MDI 90 mcg /actuation 2 puffs every 4 hours as needed for shortness of breath, wheezing, cough and a prescription cough syrup -- promethazine-DM. These have been sent to your pharmacy.   You may also take acetaminophen (Tylenol) as needed for fever.  Reduce your risk of any infection by using the same precautions used for avoiding the common cold or flu:  Wash your hands often with soap and warm water for at least 20 seconds.  If soap and water are not readily available, use  an alcohol-based hand sanitizer with at least 60% alcohol.  If coughing  or sneezing, cover your mouth and nose by coughing or sneezing into the elbow areas of your shirt or coat, into a tissue or into your sleeve (not your hands). Avoid shaking hands with others and consider head nods or verbal greetings only. Avoid touching your eyes, nose, or mouth with unwashed hands.  Avoid close contact with people who are sick. Avoid places or events with large numbers of people in one location, like concerts or sporting events. Carefully consider travel plans you have or are making. If you are planning any travel outside or inside the [place], visit the CDC's Travelers' Health webpage for the latest health notices. If you have some symptoms but not all symptoms, continue to monitor at home and seek medical attention if your symptoms worsen. If you are having a medical emergency, call 911.  HOME CARE Only take medications as instructed by your medical team. Drink plenty of fluids and get plenty of rest. A steam or ultrasonic humidifier can help if you have congestion.   GET HELP RIGHT AWAY IF YOU HAVE EMERGENCY WARNING SIGNS** FOR COVID-19. If you or someone is showing any of these signs seek emergency medical care immediately. Call 911 or proceed to your closest emergency facility if: You develop worsening high fever. Trouble breathing Bluish lips or face Persistent pain or pressure in the chest New confusion Inability to wake or stay awake You cough up blood. Your symptoms become more severe  **This list is not all possible symptoms. Contact your medical provider for any symptoms that are sever or concerning to you.  MAKE SURE YOU  Understand these instructions. Will watch your condition. Will get help right away if you are not doing well or get worse.  Your e-visit answers were reviewed by a board certified advanced clinical practitioner to complete your personal care plan.  Depending on the condition, your plan could have included both over the counter or  prescription medications.  If there is a problem please reply once you have received a response from your provider.  Your safety is important to [place].  If you have drug allergies check your prescription carefully.    You can use MyChart to ask questions about today's visit, request a non-urgent call back, or ask for a work or school excuse for 24 hours related to this e-Visit. If it has been greater than 24 hours you will need to follow up with your provider, or enter a new e-Visit to address those concerns. You will get an e-mail in the next two days asking about your experience.  I hope that your e-visit has been valuable and will speed your recovery. Thank you for using e-visits.

## 2021-03-11 NOTE — Progress Notes (Signed)
I have spent 5 minutes in review of e-visit questionnaire, review and updating patient chart, medical decision making and response to patient.   Sari Cogan Cody Luciana Cammarata, PA-C    

## 2021-03-13 ENCOUNTER — Encounter: Payer: Self-pay | Admitting: Nurse Practitioner

## 2021-03-13 ENCOUNTER — Telehealth (INDEPENDENT_AMBULATORY_CARE_PROVIDER_SITE_OTHER): Payer: No Typology Code available for payment source | Admitting: Nurse Practitioner

## 2021-03-13 VITALS — BP 122/89 | Temp 99.0°F | Wt 212.0 lb

## 2021-03-13 DIAGNOSIS — R059 Cough, unspecified: Secondary | ICD-10-CM | POA: Diagnosis not present

## 2021-03-13 DIAGNOSIS — J0100 Acute maxillary sinusitis, unspecified: Secondary | ICD-10-CM

## 2021-03-13 MED ORDER — DOXYCYCLINE HYCLATE 100 MG PO TABS: 100.0000 mg | ORAL_TABLET | Freq: Two times a day (BID) | ORAL | 0 refills | Status: DC

## 2021-03-13 NOTE — Progress Notes (Signed)
BP 122/89   Temp 99 F (37.2 C) (Oral)   Wt 212 lb (96.2 kg)   LMP 11/22/2015 (Approximate)   BMI 39.46 kg/m    Subjective:    Patient ID: Laura [name], female    DOB: 11-30-1985, 35 y.o.   MRN: [id number]  HPI: Laura [name] is a 35 y.o. female  Chief Complaint  Patient presents with   URI    Pt states that since Sunday, she has had a sore throat and headache, got worse by Monday evening. States she did not feel well at all, took a home covid test which was negative. States she developed a wet cough with bloody phlegm after. Work made her do a PCR covid test yesterday and got a negative result this morning.    UPPER RESPIRATORY TRACT INFECTION Worst symptom: Patient states she started having symptoms on Saturday. COVID negative. Fever: yes- no since Monday  Cough: yes- productive Shortness of breath: no Wheezing: no Chest pain: yes, with cough Chest tightness: yes Chest congestion: yes Nasal congestion: yes Runny nose: yes Post nasal drip: yes Sneezing: no Sore throat: yes Swollen glands: no Sinus pressure: yes Headache: yes Face pain: no Toothache: no Ear pain: no bilateral Ear pressure: yes "right Eyes red/itching:no Eye drainage/crusting: no  Vomiting: no Rash: no Fatigue: yes Sick contacts: yes Strep contacts: no  Context: stable Recurrent sinusitis: no Relief with OTC cold/cough medications: yes Treatments attempted: cough syrup    Relevant past medical, surgical, family and social history reviewed and updated as indicated. Interim medical history since our last visit reviewed. Allergies and medications reviewed and updated.  Review of Systems  Constitutional:  Positive for fever. Negative for fatigue.  HENT:  Positive for congestion, ear pain, postnasal drip, rhinorrhea, sinus pressure and sore throat. Negative for dental problem, sinus pain and sneezing.   Respiratory:  Positive for cough. Negative for shortness of breath and wheezing.    Cardiovascular:  Negative for chest pain.  Gastrointestinal:  Negative for vomiting.  Skin:  Negative for rash.  Neurological:  Positive for headaches.   Per HPI unless specifically indicated above     Objective:    BP 122/89   Temp 99 F (37.2 C) (Oral)   Wt 212 lb (96.2 kg)   LMP 11/22/2015 (Approximate)   BMI 39.46 kg/m   Wt Readings from Last 3 Encounters:  03/13/21 212 lb (96.2 kg)  01/10/21 212 lb 9.6 oz (96.4 kg)  12/06/20 210 lb 9.6 oz (95.5 kg)    Physical Exam Vitals and nursing note reviewed.  HENT:     Head: Normocephalic.     Right Ear: Hearing normal.     Left Ear: Hearing normal.     Nose: Nose normal.  Eyes:     Pupils: Pupils are equal, round, and reactive to light.  Pulmonary:     Effort: Pulmonary effort is normal. No respiratory distress.  Neurological:     Mental Status: She is alert.  Psychiatric:        Mood and Affect: Mood normal.        Behavior: Behavior normal.        Thought Content: Thought content normal.        Judgment: Judgment normal.    Results for orders placed or performed in visit on 09/30/20  CULTURE, URINE COMPREHENSIVE   Specimen: Urine   UR  Result Value Ref Range   Urine Culture, Comprehensive Final report    Organism ID, Bacteria  Comment   Microscopic Examination   Urine  Result Value Ref Range   WBC, UA 0-5 0 - 5 /hpf   RBC None seen 0 - 2 /hpf   Epithelial Cells (non renal) 0-10 0 - 10 /hpf   Bacteria, UA None seen None seen/Few  Urinalysis, Complete  Result Value Ref Range   Specific Gravity, UA 1.025 1.005 - 1.030   pH, UA 7.0 5.0 - 7.5   Color, UA Yellow Yellow   Appearance Ur Clear Clear   Leukocytes,UA Negative Negative   Protein,UA Negative Negative/Trace   Glucose, UA Negative Negative   Ketones, UA Negative Negative   RBC, UA Negative Negative   Bilirubin, UA Negative Negative   Urobilinogen, Ur 0.2 0.2 - 1.0 mg/dL   Nitrite, UA Negative Negative   Microscopic Examination See below:    BLADDER SCAN AMB NON-IMAGING  Result Value Ref Range   Scan Result 3 ml       Assessment & Plan:   Problem List Items Addressed This Visit   None Visit Diagnoses     Acute non-recurrent maxillary sinusitis    -  Primary   Complete course of Doxycyline. Ordered chest xray due to productive cough with blood. RTC if symptoms do not improve.    Relevant Medications   doxycycline (VIBRA-TABS) 100 MG tablet   Cough       Chest xray ordered.  Will make recommendations based on imaging results.    Relevant Orders   DG Chest 2 View        Follow up plan: Return if symptoms worsen or fail to improve.  This visit was completed via MyChart due to the restrictions of the COVID-19 pandemic. All issues as above were discussed and addressed. Physical exam was done as above through visual confirmation on MyChart. If it was felt that the patient should be evaluated in the office, they were directed there. The patient verbally consented to this visit. Location of the patient: Home Location of the provider: Office Those involved with this call:  Provider: Jon Billings, NP CMA: Yvonna Alanis, [place] Desk/Registration: Yvonna Alanis, CMA This encounter was conducted via video.  I spent 15 dedicated to the care of this patient on the date of this encounter to include previsit review of 20, face to face time with the patient, and post visit ordering of testing.

## 2021-03-14 ENCOUNTER — Ambulatory Visit
Admission: RE | Admit: 2021-03-14 | Discharge: 2021-03-14 | Disposition: A | Discharge status: Home / Self Care | Payer: No Typology Code available for payment source | Source: Ambulatory Visit | Attending: Nurse Practitioner | Admitting: Nurse Practitioner

## 2021-03-14 ENCOUNTER — Telehealth: Payer: No Typology Code available for payment source | Admitting: Nurse Practitioner

## 2021-03-14 ENCOUNTER — Ambulatory Visit: Payer: No Typology Code available for payment source | Admitting: Cardiology

## 2021-03-14 ENCOUNTER — Ambulatory Visit
Admission: RE | Admit: 2021-03-14 | Discharge: 2021-03-14 | Disposition: A | Discharge status: Home / Self Care | Payer: No Typology Code available for payment source | Attending: Nurse Practitioner | Admitting: Nurse Practitioner

## 2021-03-14 ENCOUNTER — Other Ambulatory Visit: Payer: Self-pay

## 2021-03-14 DIAGNOSIS — R059 Cough, unspecified: Secondary | ICD-10-CM

## 2021-03-14 NOTE — Progress Notes (Signed)
Hi Kat. Your chest xray was normal.  Please let me know if you have any other questions.

## 2021-03-21 ENCOUNTER — Other Ambulatory Visit: Payer: Self-pay

## 2021-03-21 MED ORDER — CARESTART COVID-19 HOME TEST VI KIT
PACK | 0 refills | Status: DC
  Filled 2021-03-21: qty 2, 4d supply, fill #0

## 2021-03-24 ENCOUNTER — Ambulatory Visit (INDEPENDENT_AMBULATORY_CARE_PROVIDER_SITE_OTHER): Payer: No Typology Code available for payment source | Admitting: Cardiology

## 2021-03-24 ENCOUNTER — Encounter: Payer: Self-pay | Admitting: Cardiology

## 2021-03-24 ENCOUNTER — Other Ambulatory Visit: Payer: Self-pay

## 2021-03-24 VITALS — BP 116/84 | HR 93 | Ht 62.5 in | Wt 209.0 lb

## 2021-03-24 DIAGNOSIS — Z6837 Body mass index (BMI) 37.0-37.9, adult: Secondary | ICD-10-CM | POA: Diagnosis not present

## 2021-03-24 DIAGNOSIS — F17200 Nicotine dependence, unspecified, uncomplicated: Secondary | ICD-10-CM | POA: Diagnosis not present

## 2021-03-24 DIAGNOSIS — R000 Tachycardia, unspecified: Secondary | ICD-10-CM | POA: Diagnosis not present

## 2021-03-24 MED ORDER — PROPRANOLOL HCL 10 MG PO TABS
10.0000 mg | ORAL_TABLET | Freq: Every day | ORAL | 3 refills | Status: DC
  Filled 2021-03-24: qty 90, 90d supply, fill #0

## 2021-03-24 NOTE — Progress Notes (Addendum)
Cardiology Office Note:    Date:  03/28/2021   ID:  Laura [name], DOB February 13, 1986, MRN [id number]  PCP:  Jon Billings, NP   [facility] Providers Cardiologist:  Kate Sable, MD     Referring MD: Jon Billings, NP   Chief Complaint  Patient presents with   New Patient (Initial Visit)    Referred by PCP for tachycardia. Meds reviewed verbally with patient.    Laura [name] is a 35 y.o. female who is being seen today for the evaluation of tachycardia at the request of Jon Billings, NP.   History of Present Illness:    Laura [name] is a 35 y.o. female with a hx of anxiety, current smoker x13 years who presents due to tachycardia.  She has noticed symptoms of elevated heart rates over the past 3 months.  States heart rates go up to 160s at rest, and even higher with minimal exertion.  She was started on propranolol initially as needed.  She subsequently started taking propanolol 10 mg 1 tab daily, her heart rates have improved to baseline of 90s to low 100s.  Denies palpitations.  Still has elevated heart rate sometimes when she does Zumba classes.  She otherwise has no cardiac history.  Denies chest pain or shortness of breath.  Past Medical History:  Diagnosis Date   Anxiety    Bipolar disorder ([place])    past hx of   Complication of anesthesia    "woke up during surgery"   Depression    Heavy periods    Incontinence of urine    Increased BMI    Indigestion    PTSD (post-traumatic stress disorder)    Tobacco user     Past Surgical History:  Procedure Laterality Date   CESAREAN SECTION  2010   LAPAROSCOPIC VAGINAL HYSTERECTOMY WITH SALPINGECTOMY Bilateral 12/16/2015   Procedure: LAPAROSCOPIC ASSISTED VAGINAL HYSTERECTOMY WITH BILATERAL SALPINGECTOMY;  Surgeon: Brayton Mars, MD;  Location: [facility] ORS;  Service: Gynecology;  Laterality: Bilateral;   TOTAL ABDOMINAL HYSTERECTOMY W/ BILATERAL SALPINGOOPHORECTOMY  2017   TUBAL LIGATION   2011    Current Medications: Current Meds  Medication Sig   albuterol (VENTOLIN HFA) 108 (90 Base) MCG/ACT inhaler Inhale 2 puffs into the lungs every 6 (six) hours as needed for wheezing or shortness of breath.   buPROPion (WELLBUTRIN XL) 300 MG 24 hr tablet TAKE 1 TABLET BY MOUTH DAILY   Cholecalciferol 1.25 MG (50000 UT) capsule TAKE 1 CAPSULE BY MOUTH ONCE A WEEK FOR 8 WEEKS AND THEN STOP. RETURN TO OFFICE FOR LAB DRAW.   COVID-19 At Home Antigen Test [facility] COVID-19 HOME TEST) KIT Use as directed   cyclobenzaprine (FLEXERIL) 10 MG tablet Take 1 tablet (10 mg total) by mouth 3 (three) times daily as needed for muscle spasms.   fluticasone (FLONASE) 50 MCG/ACT nasal spray Place 2 sprays into both nostrils daily.   ibuprofen (ADVIL) 800 MG tablet Take 1 tablet (800 mg total) by mouth every 8 (eight) hours as needed.   Vibegron 75 MG TABS Take 75 mg by mouth daily.   [DISCONTINUED] propranolol (INDERAL) 10 MG tablet Take 1 tablet (10 mg total) by mouth 2 (two) times daily as needed (for panic episodes).     Allergies:   Amoxicillin   Social History   Socioeconomic History   Marital status: Married    Spouse name: Not on file   Number of children: Not on file   Years of education: Not on file  Highest education level: Not on file  Occupational History   Not on file  Tobacco Use   Smoking status: Every Day    Packs/day: 0.50    Years: 11.00    Pack years: 5.50    Types: Cigarettes   Smokeless tobacco: Never  Vaping Use   Vaping Use: Never used  Substance and Sexual Activity   Alcohol use: Yes    Alcohol/week: 0.0 - 6.0 standard drinks    Comment: occasionally; beer   Drug use: No   Sexual activity: Yes    Birth control/protection: Surgical  Other Topics Concern   Not on file  Social History Narrative   Not on file   Social Determinants of Health   Financial Resource Strain: Not on file  Food Insecurity: Not on file  Transportation Needs: Not on file   Physical Activity: Not on file  Stress: Not on file  Social Connections: Not on file     Family History: The patient's family history includes Diabetes in her maternal grandmother; Heart disease in her maternal grandfather; Mental illness in her mother. There is no history of Cancer.  ROS:   Please see the history of present illness.     All other systems reviewed and are negative.  EKGs/Labs/Other Studies Reviewed:    The following studies were reviewed today:   EKG:  EKG is  ordered today.  The ekg ordered today demonstrates normal sinus rhythm  Recent Labs: No results found for requested labs within last 8760 hours.  Recent Lipid Panel    Component Value Date/Time   CHOL 162 12/28/2019 1539   TRIG 29 12/28/2019 1539   HDL 55 12/28/2019 1539   CHOLHDL 3.0 11/05/2016 1337   LDLCALC 100 (H) 12/28/2019 1539     Risk Assessment/Calculations:         Physical Exam:    VS:  BP 116/84 (BP Location: Left Arm, Patient Position: Sitting, Cuff Size: Normal)   Pulse 93   Ht 5' 2.5" (1.588 m)   Wt 209 lb (94.8 kg)   LMP 11/22/2015 (Approximate)   SpO2 97%   BMI 37.62 kg/m     Wt Readings from Last 3 Encounters:  03/24/21 209 lb (94.8 kg)  03/13/21 212 lb (96.2 kg)  01/10/21 212 lb 9.6 oz (96.4 kg)     GEN:  Well nourished, well developed in no acute distress HEENT: Normal NECK: No JVD; No carotid bruits LYMPHATICS: No lymphadenopathy CARDIAC: RRR, no murmurs, rubs, gallops RESPIRATORY:  Clear to auscultation without rales, wheezing or rhonchi  ABDOMEN: Soft, non-tender, non-distended MUSCULOSKELETAL:  No edema; No deformity  SKIN: Warm and dry NEUROLOGIC:  Alert and oriented x 3 PSYCHIATRIC:  Normal affect   ASSESSMENT:    1. Tachycardia   2. Smoking   3. BMI 37.0-37.9, adult    PLAN:    In order of problems listed above:  History of tachycardia, anxiety, panic attacks.  Tachycardia is likely inappropriate sinus tach, smoking history could be  contributing.  Symptoms overall improved with propanolol.  Recommend increasing dose of propranolol to 10 mg twice daily.  If symptoms persist, will consider cardiac monitor to evaluate other arrhythmia such as SVT. Current smoker, cessation advised. Obesity, low-calorie diet, weight loss advised.  Follow-up in 3 months.     Medication Adjustments/Labs and Tests Ordered: Current medicines are reviewed at length with the patient today.  Concerns regarding medicines are outlined above.  Orders Placed This Encounter  Procedures   EKG 12-Lead  Meds ordered this encounter  Medications   propranolol (INDERAL) 10 MG tablet    Sig: Take 1 tablet (10 mg total) by mouth daily.    Dispense:  180 tablet    Refill:  3     Patient Instructions  Medication Instructions:   Your physician has recommended you make the following change in your medication:   Take your Propanolol 10 MG twice a day.   *If you need a refill on your cardiac medications before your next appointment, please call your pharmacy*   Lab Work: None ordered If you have labs (blood work) drawn today and your tests are completely normal, you will receive your results only by: [place] (if you have MyChart) OR A paper copy in the mail If you have any lab test that is abnormal or we need to change your treatment, we will call you to review the results.   Testing/Procedures: None ordered   Follow-Up: At [facility], you and your health needs are our priority.  As part of our continuing mission to provide you with exceptional heart care, we have created designated Provider Care Teams.  These Care Teams include your primary Cardiologist (physician) and Advanced Practice Providers (APPs -  Physician Assistants and Nurse Practitioners) who all work together to provide you with the care you need, when you need it.  We recommend signing up for the patient portal called "MyChart".  Sign up information is provided  on this After Visit Summary.  MyChart is used to connect with patients for Virtual Visits (Telemedicine).  Patients are able to view lab/test results, encounter notes, upcoming appointments, etc.  Non-urgent messages can be sent to your provider as well.   To learn more about what you can do with MyChart, go to [email or web address].    Your next appointment:   3 month(s)  The format for your next appointment:   In Person  Provider:   Kate Sable, MD   Other Instructions    Signed, Kate Sable, MD  03/28/2021 3:40 PM    [place]

## 2021-03-24 NOTE — Patient Instructions (Signed)
Medication Instructions:   Your physician has recommended you make the following change in your medication:   Take your Propanolol 10 MG twice a day.   *If you need a refill on your cardiac medications before your next appointment, please call your pharmacy*   Lab Work: None ordered If you have labs (blood work) drawn today and your tests are completely normal, you will receive your results only by: [place] (if you have MyChart) OR A paper copy in the mail If you have any lab test that is abnormal or we need to change your treatment, we will call you to review the results.   Testing/Procedures: None ordered   Follow-Up: At [facility], you and your health needs are our priority.  As part of our continuing mission to provide you with exceptional heart care, we have created designated Provider Care Teams.  These Care Teams include your primary Cardiologist (physician) and Advanced Practice Providers (APPs -  Physician Assistants and Nurse Practitioners) who all work together to provide you with the care you need, when you need it.  We recommend signing up for the patient portal called "MyChart".  Sign up information is provided on this After Visit Summary.  MyChart is used to connect with patients for Virtual Visits (Telemedicine).  Patients are able to view lab/test results, encounter notes, upcoming appointments, etc.  Non-urgent messages can be sent to your provider as well.   To learn more about what you can do with MyChart, go to [email or web address].    Your next appointment:   3 month(s)  The format for your next appointment:   In Person  Provider:   Kate Sable, MD   Other Instructions

## 2021-03-28 ENCOUNTER — Other Ambulatory Visit: Payer: Self-pay

## 2021-03-28 ENCOUNTER — Telehealth: Payer: Self-pay

## 2021-03-28 MED ORDER — PROPRANOLOL HCL 10 MG PO TABS
10.0000 mg | ORAL_TABLET | Freq: Two times a day (BID) | ORAL | 3 refills | Status: DC
  Filled 2021-03-28 (×2): qty 180, 90d supply, fill #0

## 2021-03-28 NOTE — Telephone Encounter (Signed)
Called patient to respond to the patients MyChart message as copied below:  On my after visit summary at my last visit I was instructed to take propranolol twice a day, I was going to pick up my prescription today and I noticed that it says on prescription once a day. I just wanted clarification.  Was unable to leave a VM as no DPR permission on file. Did state I would send her a MyChart response. I will clarify to patient that yes, she is to take Propranolol 10 MG twice a day, and that I will send a correction to her pharmacy for the instructions.

## 2021-04-25 ENCOUNTER — Other Ambulatory Visit: Payer: Self-pay

## 2021-06-10 ENCOUNTER — Other Ambulatory Visit: Payer: Self-pay

## 2021-06-10 MED ORDER — CARESTART COVID-19 HOME TEST VI KIT
PACK | 0 refills | Status: DC
  Filled 2021-06-10: qty 2, 4d supply, fill #0

## 2021-06-16 ENCOUNTER — Ambulatory Visit: Payer: No Typology Code available for payment source | Admitting: Cardiology

## 2021-06-26 ENCOUNTER — Telehealth: Payer: No Typology Code available for payment source | Admitting: Physician Assistant

## 2021-06-26 ENCOUNTER — Other Ambulatory Visit: Payer: Self-pay

## 2021-06-26 DIAGNOSIS — M5441 Lumbago with sciatica, right side: Secondary | ICD-10-CM | POA: Diagnosis not present

## 2021-06-26 MED ORDER — BACLOFEN 10 MG PO TABS
10.0000 mg | ORAL_TABLET | Freq: Three times a day (TID) | ORAL | 0 refills | Status: DC
  Filled 2021-06-26: qty 30, 10d supply, fill #0

## 2021-06-26 MED ORDER — NAPROXEN 500 MG PO TABS
500.0000 mg | ORAL_TABLET | Freq: Two times a day (BID) | ORAL | 0 refills | Status: DC
  Filled 2021-06-26: qty 30, 15d supply, fill #0

## 2021-06-26 MED ORDER — PREDNISONE 10 MG PO TABS
ORAL_TABLET | ORAL | 0 refills | Status: DC
  Filled 2021-06-26: qty 37, 14d supply, fill #0

## 2021-06-26 NOTE — Progress Notes (Signed)
We are sorry that you are not feeling well.  Here is how we plan to help!  Based on what you have shared with me it looks like you mostly have acute back pain.  Acute back pain is defined as musculoskeletal pain that can resolve in 1-3 weeks with conservative treatment.  I have prescribed Naprosyn 500 mg take one by mouth twice a day non-steroid anti-inflammatory (NSAID) as well as Baclofen 10 mg every eight hours as needed which is a muscle relaxer.  I will also write for a prednisone taper.  Some patients experience stomach irritation or in increased heartburn with anti-inflammatory drugs.  Please keep in mind that muscle relaxer's can cause fatigue and should not be taken while at work or driving.  Back pain is very common.  The pain often gets better over time.  The cause of back pain is usually not dangerous.  Most people can learn to manage their back pain on their own.  Home Care Stay active.  Start with short walks on flat ground if you can.  Try to walk farther each day. Do not sit, drive or stand in one place for more than 30 minutes.  Do not stay in bed. Do not avoid exercise or work.  Activity can help your back heal faster. Be careful when you bend or lift an object.  Bend at your knees, keep the object close to you, and do not twist. Sleep on a firm mattress.  Lie on your side, and bend your knees.  If you lie on your back, put a pillow under your knees. Only take medicines as told by your doctor. Put ice on the injured area. Put ice in a plastic bag Place a towel between your skin and the bag Leave the ice on for 15-20 minutes, 3-4 times a day for the first 2-3 days. 210 After that, you can switch between ice and heat packs. Ask your doctor about back exercises or massage. Avoid feeling anxious or stressed.  Find good ways to deal with stress, such as exercise.  Get Help Right Way If: Your pain does not go away with rest or medicine. Your pain does not go away in 1 week. You  have new problems. You do not feel well. The pain spreads into your legs. You cannot control when you poop (bowel movement) or pee (urinate) You feel sick to your stomach (nauseous) or throw up (vomit) You have belly (abdominal) pain. You feel like you may pass out (faint). If you develop a fever.  Make Sure you: Understand these instructions. Will watch your condition Will get help right away if you are not doing well or get worse.  Your e-visit answers were reviewed by a board certified advanced clinical practitioner to complete your personal care plan.  Depending on the condition, your plan could have included both over the counter or prescription medications.  If there is a problem please reply  once you have received a response from your provider.  Your safety is important to [place].  If you have drug allergies check your prescription carefully.    You can use MyChart to ask questions about todays visit, request a non-urgent call back, or ask for a work or school excuse for 24 hours related to this e-Visit. If it has been greater than 24 hours you will need to follow up with your provider, or enter a new e-Visit to address those concerns.  You will get an e-mail in the next two  days asking about your experience.  I hope that your e-visit has been valuable and will speed your recovery. Thank you for using e-visits.   Greater than 5 minutes, yet less than 10 minutes of time have been spent researching, coordinating, and implementing care for this patient today

## 2021-06-27 ENCOUNTER — Ambulatory Visit: Payer: No Typology Code available for payment source | Admitting: Cardiology

## 2021-07-11 ENCOUNTER — Encounter: Payer: Self-pay | Admitting: Cardiology

## 2021-07-11 ENCOUNTER — Ambulatory Visit (INDEPENDENT_AMBULATORY_CARE_PROVIDER_SITE_OTHER): Payer: No Typology Code available for payment source | Admitting: Cardiology

## 2021-07-11 ENCOUNTER — Other Ambulatory Visit: Payer: Self-pay

## 2021-07-11 ENCOUNTER — Ambulatory Visit (INDEPENDENT_AMBULATORY_CARE_PROVIDER_SITE_OTHER): Payer: No Typology Code available for payment source

## 2021-07-11 VITALS — BP 112/92 | HR 94 | Ht 62.5 in | Wt 209.6 lb

## 2021-07-11 DIAGNOSIS — R0602 Shortness of breath: Secondary | ICD-10-CM | POA: Diagnosis not present

## 2021-07-11 DIAGNOSIS — R000 Tachycardia, unspecified: Secondary | ICD-10-CM | POA: Diagnosis not present

## 2021-07-11 DIAGNOSIS — F17200 Nicotine dependence, unspecified, uncomplicated: Secondary | ICD-10-CM

## 2021-07-11 DIAGNOSIS — Z6837 Body mass index (BMI) 37.0-37.9, adult: Secondary | ICD-10-CM | POA: Diagnosis not present

## 2021-07-11 MED ORDER — METOPROLOL TARTRATE 25 MG PO TABS
25.0000 mg | ORAL_TABLET | Freq: Two times a day (BID) | ORAL | 3 refills | Status: DC
  Filled 2021-07-11: qty 60, 30d supply, fill #0

## 2021-07-11 NOTE — Patient Instructions (Signed)
Medication Instructions:   Your physician has recommended you make the following change in your medication:    STOP taking your Propranolol.  2.    START taking Metoprolol Tartrate (Lopressor) 25 MG twice a day.   *If you need a refill on your cardiac medications before your next appointment, please call your pharmacy*   Lab Work: None ordered If you have labs (blood work) drawn today and your tests are completely normal, you will receive your results only by: [place] (if you have MyChart) OR A paper copy in the mail If you have any lab test that is abnormal or we need to change your treatment, we will call you to review the results.   Testing/Procedures:  Your physician has requested that you have an echocardiogram. Echocardiography is a painless test that uses sound waves to create images of your heart. It provides your doctor with information about the size and shape of your heart and how well your hearts chambers and valves are working. This procedure takes approximately one hour. There are no restrictions for this procedure.   2.   Your physician has recommended that you wear a Zio XT monitor for 2 weeks. This will be mailed to your home address in 4-5 business days.   This monitor is a medical device that records the hearts electrical activity. Doctors most often use these monitors to diagnose arrhythmias. Arrhythmias are problems with the speed or rhythm of the heartbeat. The monitor is a small device applied to your chest. You can wear one while you do your normal daily activities. While wearing this monitor if you have any symptoms to push the button and record what you felt. Once you have worn this monitor for the period of time provider prescribed (Usually 14 days), you will return the monitor device in the postage paid box. Once it is returned they will download the data collected and provide [place] with a report which the provider will then review and we will call you  with those results. Important tips:  Avoid showering during the first 24 hours of wearing the monitor. Avoid excessive sweating to help maximize wear time. Do not submerge the device, no hot tubs, and no swimming pools. Keep any lotions or oils away from the patch. After 24 hours you may shower with the patch on. Take brief showers with your back facing the shower head.  Do not remove patch once it has been placed because that will interrupt data and decrease adhesive wear time. Push the button when you have any symptoms and write down what you were feeling. Once you have completed wearing your monitor, remove and place into box which has postage paid and place in your outgoing mailbox.  If for some reason you have misplaced your box then call our office and we can provide another box and/or mail it off for you.      Follow-Up: At [facility], you and your health needs are our priority.  As part of our continuing mission to provide you with exceptional heart care, we have created designated Provider Care Teams.  These Care Teams include your primary Cardiologist (physician) and Advanced Practice Providers (APPs -  Physician Assistants and Nurse Practitioners) who all work together to provide you with the care you need, when you need it.  We recommend signing up for the patient portal called "MyChart".  Sign up information is provided on this After Visit Summary.  MyChart is used to connect with patients for  Virtual Visits (Telemedicine).  Patients are able to view lab/test results, encounter notes, upcoming appointments, etc.  Non-urgent messages can be sent to your provider as well.   To learn more about what you can do with MyChart, go to [email or web address].    Your next appointment:   6 week(s)  The format for your next appointment:   In Person  Provider:   You may see Kate Sable, MD or one of the following Advanced Practice Providers on your designated Care Team:    Murray Hodgkins, NP Christell Faith, PA-C  If MD is not listed, click here to update    :1}    Other Instructions

## 2021-07-11 NOTE — Progress Notes (Signed)
Cardiology Office Note:    Date:  07/11/2021   ID:  Laura [name], DOB 10-07-1985, MRN [id number]  PCP:  Jon Billings, NP   [facility] Providers Cardiologist:  Kate Sable, MD     Referring MD: Jon Billings, NP   No chief complaint on file.   History of Present Illness:    Laura [name] is a 36 y.o. female with a hx of anxiety, current smoker x13 years who presents for follow-up.  Last seen due to tachycardia.  Propranolol was increased to 10 mg twice daily, patient still states having tachycardia, very worried something is wrong with her heart.  She had to leave work early today.  States feeling diaphoretic, occasional shortness of breath when her heart beats fast.  She increase propranolol dose to 10 mg 4 times daily without improvement.   Past Medical History:  Diagnosis Date   Anxiety    Bipolar disorder ([place])    past hx of   Complication of anesthesia    "woke up during surgery"   Depression    Heavy periods    Incontinence of urine    Increased BMI    Indigestion    PTSD (post-traumatic stress disorder)    Tobacco user     Past Surgical History:  Procedure Laterality Date   CESAREAN SECTION  2010   LAPAROSCOPIC VAGINAL HYSTERECTOMY WITH SALPINGECTOMY Bilateral 12/16/2015   Procedure: LAPAROSCOPIC ASSISTED VAGINAL HYSTERECTOMY WITH BILATERAL SALPINGECTOMY;  Surgeon: Brayton Mars, MD;  Location: [facility] ORS;  Service: Gynecology;  Laterality: Bilateral;   TOTAL ABDOMINAL HYSTERECTOMY W/ BILATERAL SALPINGOOPHORECTOMY  2017   TUBAL LIGATION  2011    Current Medications: Current Meds  Medication Sig   albuterol (VENTOLIN HFA) 108 (90 Base) MCG/ACT inhaler Inhale 2 puffs into the lungs every 6 (six) hours as needed for wheezing or shortness of breath.   baclofen (LIORESAL) 10 MG tablet Take 1 tablet (10 mg total) by mouth 3 (three) times daily.   buPROPion (WELLBUTRIN XL) 300 MG 24 hr tablet TAKE 1 TABLET BY MOUTH DAILY    Cholecalciferol 1.25 MG (50000 UT) capsule TAKE 1 CAPSULE BY MOUTH ONCE A WEEK FOR 8 WEEKS AND THEN STOP. RETURN TO OFFICE FOR LAB DRAW.   COVID-19 At Home Antigen Test [facility] COVID-19 HOME TEST) KIT test as directed   cyclobenzaprine (FLEXERIL) 10 MG tablet Take 1 tablet (10 mg total) by mouth 3 (three) times daily as needed for muscle spasms.   fluticasone (FLONASE) 50 MCG/ACT nasal spray Place 2 sprays into both nostrils daily.   ibuprofen (ADVIL) 800 MG tablet Take 1 tablet (800 mg total) by mouth every 8 (eight) hours as needed.   metoprolol tartrate (LOPRESSOR) 25 MG tablet Take 1 tablet (25 mg total) by mouth 2 (two) times daily.   naproxen (NAPROSYN) 500 MG tablet Take 1 tablet (500 mg total) by mouth 2 (two) times daily with a meal.   predniSONE (DELTASONE) 10 MG tablet Days 1-4 take 4 tablets (40 mg) daily  Days 5-8 take 3 tablets (30 mg) daily, Days 9-11 take 2 tablets (20 mg) daily, Days 12-14 take 1 tablet (10 mg) daily   [DISCONTINUED] propranolol (INDERAL) 10 MG tablet Take 1 tablet (10 mg total) by mouth 2 (two) times daily.     Allergies:   Amoxicillin   Social History   Socioeconomic History   Marital status: Married    Spouse name: Not on file   Number of children: Not on file  Years of education: Not on file   Highest education level: Not on file  Occupational History   Not on file  Tobacco Use   Smoking status: Every Day    Packs/day: 0.50    Years: 11.00    Pack years: 5.50    Types: Cigarettes   Smokeless tobacco: Never  Vaping Use   Vaping Use: Never used  Substance and Sexual Activity   Alcohol use: Yes    Alcohol/week: 0.0 - 6.0 standard drinks    Comment: occasionally; beer   Drug use: No   Sexual activity: Yes    Birth control/protection: Surgical  Other Topics Concern   Not on file  Social History Narrative   Not on file   Social Determinants of Health   Financial Resource Strain: Not on file  Food Insecurity: Not on file   Transportation Needs: Not on file  Physical Activity: Not on file  Stress: Not on file  Social Connections: Not on file     Family History: The patient's family history includes Diabetes in her maternal grandmother; Heart disease in her maternal grandfather; Mental illness in her mother. There is no history of Cancer.  ROS:   Please see the history of present illness.     All other systems reviewed and are negative.  EKGs/Labs/Other Studies Reviewed:    The following studies were reviewed today:   EKG:  EKG is  ordered today.  The ekg ordered today demonstrates normal sinus rhythm  Recent Labs: No results found for requested labs within last 8760 hours.  Recent Lipid Panel    Component Value Date/Time   CHOL 162 12/28/2019 1539   TRIG 29 12/28/2019 1539   HDL 55 12/28/2019 1539   CHOLHDL 3.0 11/05/2016 1337   LDLCALC 100 (H) 12/28/2019 1539     Risk Assessment/Calculations:         Physical Exam:    VS:  BP (!) 112/92    Pulse 94    Ht 5' 2.5" (1.588 m)    Wt 209 lb 9.6 oz (95.1 kg)    LMP 11/22/2015 (Approximate)    SpO2 98%    BMI 37.73 kg/m     Wt Readings from Last 3 Encounters:  07/11/21 209 lb 9.6 oz (95.1 kg)  03/24/21 209 lb (94.8 kg)  03/13/21 212 lb (96.2 kg)     GEN:  Well nourished, well developed in no acute distress HEENT: Normal NECK: No JVD; No carotid bruits LYMPHATICS: No lymphadenopathy CARDIAC: RRR, no murmurs, rubs, gallops RESPIRATORY:  Clear to auscultation without rales, wheezing or rhonchi  ABDOMEN: Soft, non-tender, non-distended MUSCULOSKELETAL:  No edema; No deformity  SKIN: Warm and dry NEUROLOGIC:  Alert and oriented x 3 PSYCHIATRIC:  Normal affect   ASSESSMENT:    1. Tachycardia   2. Shortness of breath   3. Smoking   4. BMI 37.0-37.9, adult     PLAN:    In order of problems listed above:  History of tachycardia, anxiety, panic attacks.  Symptoms persist despite propranolol increase.  Start Lopressor 25 mg twice  daily.  Place cardiac monitor to evaluate any arrhythmias.  If heart monitor and echo are otherwise unremarkable, plan to reassure patient.  Anxiety possibly contributing. Shortness of breath, obtain echocardiogram to rule out any structural abnormalities. Current smoker, cessation advised. Obesity, low-calorie diet, weight loss advised.  Follow-up in 6 weeks     Medication Adjustments/Labs and Tests Ordered: Current medicines are reviewed at length with the patient today.  Concerns regarding medicines are outlined above.  Orders Placed This Encounter  Procedures   LONG TERM MONITOR (3-14 DAYS)   EKG 12-Lead   ECHOCARDIOGRAM COMPLETE    Meds ordered this encounter  Medications   metoprolol tartrate (LOPRESSOR) 25 MG tablet    Sig: Take 1 tablet (25 mg total) by mouth 2 (two) times daily.    Dispense:  60 tablet    Refill:  3     Patient Instructions  Medication Instructions:   Your physician has recommended you make the following change in your medication:    STOP taking your Propranolol.  2.    START taking Metoprolol Tartrate (Lopressor) 25 MG twice a day.   *If you need a refill on your cardiac medications before your next appointment, please call your pharmacy*   Lab Work: None ordered If you have labs (blood work) drawn today and your tests are completely normal, you will receive your results only by: [place] (if you have MyChart) OR A paper copy in the mail If you have any lab test that is abnormal or we need to change your treatment, we will call you to review the results.   Testing/Procedures:  Your physician has requested that you have an echocardiogram. Echocardiography is a painless test that uses sound waves to create images of your heart. It provides your doctor with information about the size and shape of your heart and how well your hearts chambers and valves are working. This procedure takes approximately one hour. There are no restrictions  for this procedure.   2.   Your physician has recommended that you wear a Zio XT monitor for 2 weeks. This will be mailed to your home address in 4-5 business days.   This monitor is a medical device that records the hearts electrical activity. Doctors most often use these monitors to diagnose arrhythmias. Arrhythmias are problems with the speed or rhythm of the heartbeat. The monitor is a small device applied to your chest. You can wear one while you do your normal daily activities. While wearing this monitor if you have any symptoms to push the button and record what you felt. Once you have worn this monitor for the period of time provider prescribed (Usually 14 days), you will return the monitor device in the postage paid box. Once it is returned they will download the data collected and provide [place] with a report which the provider will then review and we will call you with those results. Important tips:  Avoid showering during the first 24 hours of wearing the monitor. Avoid excessive sweating to help maximize wear time. Do not submerge the device, no hot tubs, and no swimming pools. Keep any lotions or oils away from the patch. After 24 hours you may shower with the patch on. Take brief showers with your back facing the shower head.  Do not remove patch once it has been placed because that will interrupt data and decrease adhesive wear time. Push the button when you have any symptoms and write down what you were feeling. Once you have completed wearing your monitor, remove and place into box which has postage paid and place in your outgoing mailbox.  If for some reason you have misplaced your box then call our office and we can provide another box and/or mail it off for you.      Follow-Up: At [facility], you and your health needs are our priority.  As part of our continuing mission to  provide you with exceptional heart care, we have created designated Provider Care Teams.  These Care  Teams include your primary Cardiologist (physician) and Advanced Practice Providers (APPs -  Physician Assistants and Nurse Practitioners) who all work together to provide you with the care you need, when you need it.  We recommend signing up for the patient portal called "MyChart".  Sign up information is provided on this After Visit Summary.  MyChart is used to connect with patients for Virtual Visits (Telemedicine).  Patients are able to view lab/test results, encounter notes, upcoming appointments, etc.  Non-urgent messages can be sent to your provider as well.   To learn more about what you can do with MyChart, go to [email or web address].    Your next appointment:   6 week(s)  The format for your next appointment:   In Person  Provider:   You may see Kate Sable, MD or one of the following Advanced Practice Providers on your designated Care Team:   Murray Hodgkins, NP Christell Faith, PA-C  If MD is not listed, click here to update    :1}    Other Instructions      Signed, Kate Sable, MD  07/11/2021 5:17 PM    [place]

## 2021-07-15 DIAGNOSIS — R000 Tachycardia, unspecified: Secondary | ICD-10-CM

## 2021-08-04 ENCOUNTER — Ambulatory Visit (INDEPENDENT_AMBULATORY_CARE_PROVIDER_SITE_OTHER): Payer: No Typology Code available for payment source

## 2021-08-04 ENCOUNTER — Other Ambulatory Visit: Payer: Self-pay

## 2021-08-04 DIAGNOSIS — R0602 Shortness of breath: Secondary | ICD-10-CM | POA: Diagnosis not present

## 2021-08-04 LAB — ECHOCARDIOGRAM COMPLETE
AR max vel: 2.31 cm2
AV Area VTI: 2.39 cm2
AV Area mean vel: 2.34 cm2
AV Mean grad: 3 mmHg
AV Peak grad: 5.5 mmHg
Ao pk vel: 1.17 m/s
Area-P 1/2: 4.21 cm2
Calc EF: 65.1 %
S' Lateral: 3.2 cm
Single Plane A2C EF: 69.7 %
Single Plane A4C EF: 61.8 %

## 2021-08-22 ENCOUNTER — Other Ambulatory Visit: Payer: Self-pay

## 2021-08-22 ENCOUNTER — Encounter: Payer: Self-pay | Admitting: Cardiology

## 2021-08-22 ENCOUNTER — Ambulatory Visit (INDEPENDENT_AMBULATORY_CARE_PROVIDER_SITE_OTHER): Payer: No Typology Code available for payment source | Admitting: Cardiology

## 2021-08-22 VITALS — BP 126/80 | HR 99 | Ht 62.0 in | Wt 211.0 lb

## 2021-08-22 DIAGNOSIS — F17200 Nicotine dependence, unspecified, uncomplicated: Secondary | ICD-10-CM | POA: Diagnosis not present

## 2021-08-22 DIAGNOSIS — R000 Tachycardia, unspecified: Secondary | ICD-10-CM | POA: Diagnosis not present

## 2021-08-22 DIAGNOSIS — R0602 Shortness of breath: Secondary | ICD-10-CM | POA: Diagnosis not present

## 2021-08-22 MED ORDER — METOPROLOL SUCCINATE ER 25 MG PO TB24
25.0000 mg | ORAL_TABLET | Freq: Every day | ORAL | 5 refills | Status: DC
  Filled 2021-08-22 – 2021-09-26 (×2): qty 30, 30d supply, fill #0

## 2021-08-22 NOTE — Progress Notes (Signed)
Cardiology Office Note:    Date:  08/22/2021   ID:  Laura [name], DOB June 06, 1986, MRN [id number]  PCP:  Jon Billings, NP   [facility] Providers Cardiologist:  Kate Sable, MD     Referring MD: Jon Billings, NP   Chief Complaint  Patient presents with   Other    6 week follow up -- Patient c.o more headaches, SOB on exertion. Meds reviewed verbally with Patient.     History of Present Illness:    Laura [name] is a 36 y.o. female with a hx of anxiety, current smoker x13 years who presents for follow-up.  Last seen due to tachycardia, shortness of breath and palpitations.  Cardiac monitor was placed to evaluate any significant arrhythmias.  Echo also obtained to evaluate cardiac function.  Was previously on propranolol without much improvement in her symptoms of tachycardia/palpitations.  Patient started on Lopressor 25 mg twice daily.  She states heart rates dropped to low 80s 25 mg twice daily of Lopressor.  Takes Lopressor 25 mg once a day.  Symptoms seem to have improved.   Past Medical History:  Diagnosis Date   Anxiety    Bipolar disorder ([place])    past hx of   Complication of anesthesia    "woke up during surgery"   Depression    Heavy periods    Incontinence of urine    Increased BMI    Indigestion    PTSD (post-traumatic stress disorder)    Tobacco user     Past Surgical History:  Procedure Laterality Date   CESAREAN SECTION  2010   LAPAROSCOPIC VAGINAL HYSTERECTOMY WITH SALPINGECTOMY Bilateral 12/16/2015   Procedure: LAPAROSCOPIC ASSISTED VAGINAL HYSTERECTOMY WITH BILATERAL SALPINGECTOMY;  Surgeon: Brayton Mars, MD;  Location: [facility] ORS;  Service: Gynecology;  Laterality: Bilateral;   TOTAL ABDOMINAL HYSTERECTOMY W/ BILATERAL SALPINGOOPHORECTOMY  2017   TUBAL LIGATION  2011    Current Medications: Current Meds  Medication Sig   albuterol (VENTOLIN HFA) 108 (90 Base) MCG/ACT inhaler Inhale 2 puffs into the lungs every 6  (six) hours as needed for wheezing or shortness of breath.   baclofen (LIORESAL) 10 MG tablet Take 1 tablet (10 mg total) by mouth 3 (three) times daily.   buPROPion (WELLBUTRIN XL) 300 MG 24 hr tablet TAKE 1 TABLET BY MOUTH DAILY   fluticasone (FLONASE) 50 MCG/ACT nasal spray Place 2 sprays into both nostrils daily.   ibuprofen (ADVIL) 800 MG tablet Take 1 tablet (800 mg total) by mouth every 8 (eight) hours as needed.   metoprolol succinate (TOPROL XL) 25 MG 24 hr tablet Take 1 tablet (25 mg total) by mouth daily.   naproxen (NAPROSYN) 500 MG tablet Take 1 tablet (500 mg total) by mouth 2 (two) times daily with a meal.   [DISCONTINUED] metoprolol tartrate (LOPRESSOR) 25 MG tablet Take 1 tablet (25 mg total) by mouth 2 (two) times daily.     Allergies:   Amoxicillin   Social History   Socioeconomic History   Marital status: Married    Spouse name: Not on file   Number of children: Not on file   Years of education: Not on file   Highest education level: Not on file  Occupational History   Not on file  Tobacco Use   Smoking status: Every Day    Packs/day: 0.50    Years: 11.00    Pack years: 5.50    Types: Cigarettes   Smokeless tobacco: Never  Vaping Use  Vaping Use: Never used  Substance and Sexual Activity   Alcohol use: Yes    Alcohol/week: 0.0 - 6.0 standard drinks    Comment: occasionally; beer   Drug use: No   Sexual activity: Yes    Birth control/protection: Surgical  Other Topics Concern   Not on file  Social History Narrative   Not on file   Social Determinants of Health   Financial Resource Strain: Not on file  Food Insecurity: Not on file  Transportation Needs: Not on file  Physical Activity: Not on file  Stress: Not on file  Social Connections: Not on file     Family History: The patient's family history includes Diabetes in her maternal grandmother; Heart disease in her maternal grandfather; Mental illness in her mother. There is no history of  Cancer.  ROS:   Please see the history of present illness.     All other systems reviewed and are negative.  EKGs/Labs/Other Studies Reviewed:    The following studies were reviewed today:   EKG:  EKG not ordered today.    Recent Labs: No results found for requested labs within last 8760 hours.  Recent Lipid Panel    Component Value Date/Time   CHOL 162 12/28/2019 1539   TRIG 29 12/28/2019 1539   HDL 55 12/28/2019 1539   CHOLHDL 3.0 11/05/2016 1337   LDLCALC 100 (H) 12/28/2019 1539     Risk Assessment/Calculations:         Physical Exam:    VS:  BP 126/80 (BP Location: Left Arm, Patient Position: Sitting, Cuff Size: Normal)    Pulse 99    Ht 5\' 2"  (1.575 m)    Wt 211 lb (95.7 kg)    LMP 11/22/2015 (Approximate)    SpO2 98%    BMI 38.59 kg/m     Wt Readings from Last 3 Encounters:  08/22/21 211 lb (95.7 kg)  07/11/21 209 lb 9.6 oz (95.1 kg)  03/24/21 209 lb (94.8 kg)     GEN:  Well nourished, well developed in no acute distress HEENT: Normal NECK: No JVD; No carotid bruits LYMPHATICS: No lymphadenopathy CARDIAC: RRR, no murmurs, rubs, gallops RESPIRATORY:  Clear to auscultation without rales, wheezing or rhonchi  ABDOMEN: Soft, non-tender, non-distended MUSCULOSKELETAL:  No edema; No deformity  SKIN: Warm and dry NEUROLOGIC:  Alert and oriented x 3 PSYCHIATRIC:  Normal affect   ASSESSMENT:    1. Tachycardia   2. Shortness of breath   3. Smoking      PLAN:    In order of problems listed above:  History of tachycardia, anxiety, panic attacks.  Cardiac monitor with no significant arrhythmias.  Echo with normal structural abnormalities.  Okay to take Lopressor 12.5 mg twice daily until current tablets completed.  Start Toprol-XL 25 mg twice daily after.   Anxiety possibly contributing. Shortness of breath, echocardiogram showed normal systolic and diastolic function, EF 55 to 60%.  No structural cardiac abnormalities noted to suggest etiology of shortness  of breath. Current smoker, cessation recommended.  Follow-up as needed     Medication Adjustments/Labs and Tests Ordered: Current medicines are reviewed at length with the patient today.  Concerns regarding medicines are outlined above.  No orders of the defined types were placed in this encounter.   Meds ordered this encounter  Medications   metoprolol succinate (TOPROL XL) 25 MG 24 hr tablet    Sig: Take 1 tablet (25 mg total) by mouth daily.    Dispense:  30 tablet  Refill:  5     Patient Instructions  Medication Instructions:     Take your Metoprolol Tartrate (Lopressor) 25 MG as half a tablet in the AM and half a tablet in the PM until you run out. Then start Metoprolol Succinate (Toprol XL) 25 MG once a day.  *If you need a refill on your cardiac medications before your next appointment, please call your pharmacy*   Lab Work: None ordered If you have labs (blood work) drawn today and your tests are completely normal, you will receive your results only by: [place] (if you have MyChart) OR A paper copy in the mail If you have any lab test that is abnormal or we need to change your treatment, we will call you to review the results.   Testing/Procedures: None ordered   Follow-Up: At [facility], you and your health needs are our priority.  As part of our continuing mission to provide you with exceptional heart care, we have created designated Provider Care Teams.  These Care Teams include your primary Cardiologist (physician) and Advanced Practice Providers (APPs -  Physician Assistants and Nurse Practitioners) who all work together to provide you with the care you need, when you need it.  We recommend signing up for the patient portal called "MyChart".  Sign up information is provided on this After Visit Summary.  MyChart is used to connect with patients for Virtual Visits (Telemedicine).  Patients are able to view lab/test results, encounter notes,  upcoming appointments, etc.  Non-urgent messages can be sent to your provider as well.   To learn more about what you can do with MyChart, go to [email or web address].    Your next appointment:   Follow up as needed   The format for your next appointment:   In Person  Provider:   You may see Kate Sable, MD or one of the following Advanced Practice Providers on your designated Care Team:   Murray Hodgkins, NP Christell Faith, PA-C Cadence Kathlen Mody, [place]    Other Instructions     Signed, Kate Sable, MD  08/22/2021 3:22 PM    [facility]

## 2021-08-22 NOTE — Patient Instructions (Signed)
Medication Instructions:     Take your Metoprolol Tartrate (Lopressor) 25 MG as half a tablet in the AM and half a tablet in the PM until you run out. Then start Metoprolol Succinate (Toprol XL) 25 MG once a day.  *If you need a refill on your cardiac medications before your next appointment, please call your pharmacy*   Lab Work: None ordered If you have labs (blood work) drawn today and your tests are completely normal, you will receive your results only by: [place] (if you have MyChart) OR A paper copy in the mail If you have any lab test that is abnormal or we need to change your treatment, we will call you to review the results.   Testing/Procedures: None ordered   Follow-Up: At [facility], you and your health needs are our priority.  As part of our continuing mission to provide you with exceptional heart care, we have created designated Provider Care Teams.  These Care Teams include your primary Cardiologist (physician) and Advanced Practice Providers (APPs -  Physician Assistants and Nurse Practitioners) who all work together to provide you with the care you need, when you need it.  We recommend signing up for the patient portal called "MyChart".  Sign up information is provided on this After Visit Summary.  MyChart is used to connect with patients for Virtual Visits (Telemedicine).  Patients are able to view lab/test results, encounter notes, upcoming appointments, etc.  Non-urgent messages can be sent to your provider as well.   To learn more about what you can do with MyChart, go to [email or web address].    Your next appointment:   Follow up as needed   The format for your next appointment:   In Person  Provider:   You may see Kate Sable, MD or one of the following Advanced Practice Providers on your designated Care Team:   Murray Hodgkins, NP Christell Faith, PA-C Cadence Kathlen Mody, [place]    Other Instructions

## 2021-09-04 ENCOUNTER — Other Ambulatory Visit: Payer: Self-pay

## 2021-09-26 ENCOUNTER — Other Ambulatory Visit: Payer: Self-pay | Admitting: Nurse Practitioner

## 2021-09-26 ENCOUNTER — Other Ambulatory Visit: Payer: Self-pay

## 2021-09-29 ENCOUNTER — Other Ambulatory Visit: Payer: Self-pay

## 2021-09-29 NOTE — Telephone Encounter (Signed)
Requested medication (s) are due for refill today: no ? ?Requested medication (s) are on the active medication list: no ? ?Last refill:  not on current med list, lab is from 2021 ? ?Future visit scheduled: no ? ?Notes to clinic:  This was for 8 weeks then redraw lab, do not see redrawn lab, not delegated, please assess. ? ? ? ?  ? ?Requested Prescriptions  ?Pending Prescriptions Disp Refills  ? D3-50 1.25 MG (50000 UT) capsule [Pharmacy Med Name: Cholecalciferol 1.25 MG (50000 UT) capsule] 8 capsule 3  ?  Sig: TAKE 1 CAPSULE BY MOUTH ONCE A WEEK FOR 8 WEEKS AND THEN STOP. RETURN TO OFFICE FOR LAB DRAW.  ?  ? Endocrinology:  Vitamins - Vitamin D Supplementation 2 Failed - 09/26/2021 12:09 PM  ?  ?  Failed - Manual Review: Route requests for 50,000 IU strength to the provider  ?  ?  Failed - Ca in normal range and within 360 days  ?  Calcium  ?Date Value Ref Range Status  ?12/28/2019 9.0 8.7 - 10.2 mg/dL Final  ? ?Calcium, Total  ?Date Value Ref Range Status  ?04/24/2013 9.0 8.5 - 10.1 mg/dL Final  ?  ?  ?  ?  Failed - Vitamin D in normal range and within 360 days  ?  Vit D, 25-Hydroxy  ?Date Value Ref Range Status  ?12/28/2019 13.4 (L) 30.0 - 100.0 ng/mL Final  ?  Comment:  ?  Vitamin D deficiency has been defined by the Institute of ?Medicine and an Endocrine Society practice guideline as a ?level of serum 25-OH vitamin D less than 20 ng/mL (1,2). ?The Endocrine Society went on to further define vitamin D ?insufficiency as a level between 21 and 29 ng/mL (2). ?1. IOM (Institute of Medicine). 2010. Dietary reference ?   intakes for calcium and D. [place]: The ?   [employer]. ?2. Holick MF, Binkley [place], Bischoff-Ferrari HA, et al. ?   Evaluation, treatment, and prevention of vitamin D ?   deficiency: an Endocrine Society clinical practice ?   guideline. JCEM. 2011 Jul; 96(7):1911-30. ?  ?  ?  ?  ?  Passed - Valid encounter within last 12 months  ?  Recent Outpatient Visits   ? ?      ? 6 months ago  Acute non-recurrent maxillary sinusitis  ? [place], NP  ? 8 months ago Insomnia, unspecified type  ? [place], NP  ? 9 months ago Moderate episode of recurrent major depressive disorder ([place])  ? Ray, [place] T, NP  ? 10 months ago Upper respiratory virus  ? [place], NP  ? 1 year ago Moderate episode of recurrent major depressive disorder ([place])  ? [facility] [place], Henrine Screws T, NP  ? ?  ?  ? ?  ?  ?  ? ? ?

## 2021-10-26 NOTE — Patient Instructions (Incomplete)
Healthy Eating ?Following a healthy eating pattern may help you to achieve and maintain a healthy body weight, reduce the risk of chronic disease, and live a long and productive life. It is important to follow a healthy eating pattern at an appropriate calorie level for your body. Your nutritional needs should be met primarily through food by choosing a variety of nutrient-rich foods. ?What are tips for following this plan? ?Reading food labels ?Read labels and choose the following: ?Reduced or low sodium. ?Juices with 100% fruit juice. ?Foods with low saturated fats and high polyunsaturated and monounsaturated fats. ?Foods with whole grains, such as whole wheat, cracked wheat, brown rice, and wild rice. ?Whole grains that are fortified with folic acid. This is recommended for women who are pregnant or who want to become pregnant. ?Read labels and avoid the following: ?Foods with a lot of added sugars. These include foods that contain brown sugar, corn sweetener, corn syrup, dextrose, fructose, glucose, high-fructose corn syrup, honey, invert sugar, lactose, malt syrup, maltose, molasses, raw sugar, sucrose, trehalose, or turbinado sugar. ?Do not eat more than the following amounts of added sugar per day: ?6 teaspoons (25 g) for women. ?9 teaspoons (38 g) for men. ?Foods that contain processed or refined starches and grains. ?Refined grain products, such as white flour, degermed cornmeal, white bread, and white rice. ?Shopping ?Choose nutrient-rich snacks, such as vegetables, whole fruits, and nuts. Avoid high-calorie and high-sugar snacks, such as potato chips, fruit snacks, and candy. ?Use oil-based dressings and spreads on foods instead of solid fats such as butter, stick margarine, or cream cheese. ?Limit pre-made sauces, mixes, and "instant" products such as flavored rice, instant noodles, and ready-made pasta. ?Try more plant-protein sources, such as tofu, tempeh, black beans, edamame, lentils, nuts, and  seeds. ?Explore eating plans such as the Mediterranean diet or vegetarian diet. ?Cooking ?Use oil to saut? or stir-fry foods instead of solid fats such as butter, stick margarine, or lard. ?Try baking, boiling, grilling, or broiling instead of frying. ?Remove the fatty part of meats before cooking. ?Steam vegetables in water or broth. ?Meal planning ? ?At meals, imagine dividing your plate into fourths: ?One-half of your plate is fruits and vegetables. ?One-fourth of your plate is whole grains. ?One-fourth of your plate is protein, especially lean meats, poultry, eggs, tofu, beans, or nuts. ?Include low-fat dairy as part of your daily diet. ?Lifestyle ?Choose healthy options in all settings, including home, work, school, restaurants, or stores. ?Prepare your food safely: ?Wash your hands after handling raw meats. ?Keep food preparation surfaces clean by regularly washing with hot, soapy water. ?Keep raw meats separate from ready-to-eat foods, such as fruits and vegetables. ?Cook seafood, meat, poultry, and eggs to the recommended internal temperature. ?Store foods at safe temperatures. In general: ?Keep cold foods at 40?F (4.4?C) or below. ?Keep hot foods at 140?F (60?C) or above. ?Keep your freezer at 0?F (-17.8?C) or below. ?Foods are no longer safe to eat when they have been between the temperatures of 40?-140?F (4.4-60?C) for more than 2 hours. ?What foods should I eat? ?Fruits ?Aim to eat 2 cup-equivalents of fresh, canned (in natural juice), or frozen fruits each day. Examples of 1 cup-equivalent of fruit include 1 small apple, 8 large strawberries, 1 cup canned fruit, ? cup dried fruit, or 1 cup 100% juice. ?Vegetables ?Aim to eat 2?-3 cup-equivalents of fresh and frozen vegetables each day, including different varieties and colors. Examples of 1 cup-equivalent of vegetables include 2 medium carrots, 2 cups raw,  leafy greens, 1 cup chopped vegetable (raw or cooked), or 1 medium baked potato. ?Grains ?Aim to  eat 6 ounce-equivalents of whole grains each day. Examples of 1 ounce-equivalent of grains include 1 slice of bread, 1 cup ready-to-eat cereal, 3 cups popcorn, or ? cup cooked rice, pasta, or cereal. ?Meats and other proteins ?Aim to eat 5-6 ounce-equivalents of protein each day. Examples of 1 ounce-equivalent of protein include 1 egg, 1/2 cup nuts or seeds, or 1 tablespoon (16 g) peanut butter. A cut of meat or fish that is the size of a deck of cards is about 3-4 ounce-equivalents. ?Of the protein you eat each week, try to have at least 8 ounces come from seafood. This includes salmon, trout, herring, and anchovies. ?Dairy ?Aim to eat 3 cup-equivalents of fat-free or low-fat dairy each day. Examples of 1 cup-equivalent of dairy include 1 cup (240 mL) milk, 8 ounces (250 g) yogurt, 1? ounces (44 g) natural cheese, or 1 cup (240 mL) fortified soy milk. ?Fats and oils ?Aim for about 5 teaspoons (21 g) per day. Choose monounsaturated fats, such as canola and olive oils, avocados, peanut butter, and most nuts, or polyunsaturated fats, such as sunflower, corn, and soybean oils, walnuts, pine nuts, sesame seeds, sunflower seeds, and flaxseed. ?Beverages ?Aim for six 8-oz glasses of water per day. Limit coffee to three to five 8-oz cups per day. ?Limit caffeinated beverages that have added calories, such as soda and energy drinks. ?Limit alcohol intake to no more than 1 drink a day for nonpregnant women and 2 drinks a day for men. One drink equals 12 oz of beer (355 mL), 5 oz of wine (148 mL), or 1? oz of hard liquor (44 mL). ?Seasoning and other foods ?Avoid adding excess amounts of salt to your foods. Try flavoring foods with herbs and spices instead of salt. ?Avoid adding sugar to foods. ?Try using oil-based dressings, sauces, and spreads instead of solid fats. ?This information is based on general [place] nutrition guidelines. For more information, visit choosemyplate.gov. Exact amounts may vary based on your nutrition  needs. ?Summary ?A healthy eating plan may help you to maintain a healthy weight, reduce the risk of chronic diseases, and stay active throughout your life. ?Plan your meals. Make sure you eat the right portions of a variety of nutrient-rich foods. ?Try baking, boiling, grilling, or broiling instead of frying. ?Choose healthy options in all settings, including home, work, school, restaurants, or stores. ?This information is not intended to replace advice given to you by your health care provider. Make sure you discuss any questions you have with your health care provider. ?Document Revised: 02/11/2021 Document Reviewed: 02/11/2021 ?Elsevier Patient Education ? 2023 [employer]. ? ?

## 2021-10-29 ENCOUNTER — Ambulatory Visit: Payer: No Typology Code available for payment source | Admitting: Nurse Practitioner

## 2021-10-31 ENCOUNTER — Ambulatory Visit: Payer: No Typology Code available for payment source | Admitting: Family Medicine

## 2021-11-14 ENCOUNTER — Encounter: Payer: Self-pay | Admitting: Nurse Practitioner

## 2021-11-14 ENCOUNTER — Ambulatory Visit (INDEPENDENT_AMBULATORY_CARE_PROVIDER_SITE_OTHER): Payer: No Typology Code available for payment source | Admitting: Nurse Practitioner

## 2021-11-14 ENCOUNTER — Other Ambulatory Visit: Payer: Self-pay

## 2021-11-14 VITALS — BP 110/77 | HR 83 | Temp 97.8°F | Ht 63.0 in | Wt 208.4 lb

## 2021-11-14 DIAGNOSIS — F330 Major depressive disorder, recurrent, mild: Secondary | ICD-10-CM | POA: Diagnosis not present

## 2021-11-14 DIAGNOSIS — Z6836 Body mass index (BMI) 36.0-36.9, adult: Secondary | ICD-10-CM | POA: Diagnosis not present

## 2021-11-14 DIAGNOSIS — Z0000 Encounter for general adult medical examination without abnormal findings: Secondary | ICD-10-CM | POA: Diagnosis not present

## 2021-11-14 DIAGNOSIS — Z13220 Encounter for screening for lipoid disorders: Secondary | ICD-10-CM | POA: Diagnosis not present

## 2021-11-14 DIAGNOSIS — E6609 Other obesity due to excess calories: Secondary | ICD-10-CM

## 2021-11-14 DIAGNOSIS — Z113 Encounter for screening for infections with a predominantly sexual mode of transmission: Secondary | ICD-10-CM

## 2021-11-14 DIAGNOSIS — Z136 Encounter for screening for cardiovascular disorders: Secondary | ICD-10-CM

## 2021-11-14 MED ORDER — WEGOVY 0.25 MG/0.5ML ~~LOC~~ SOAJ
0.2500 mg | SUBCUTANEOUS | 2 refills | Status: DC
  Filled 2021-11-14 – 2021-12-31 (×3): qty 2, 28d supply, fill #0

## 2021-11-14 NOTE — Patient Instructions (Signed)

## 2021-11-14 NOTE — Progress Notes (Signed)
BP 110/77   Pulse 83   Temp 97.8 F (36.6 C) (Oral)   Ht '5\' 3"'$  (1.6 m)   Wt 208 lb 6.4 oz (94.5 kg)   LMP 11/22/2015 (Approximate)   SpO2 98%   BMI 36.92 kg/m    Subjective:    Patient ID: Laura [name], female    DOB: 1985-09-29, 36 y.o.   MRN: [id number]  HPI: Laura [name] is a 36 y.o. female presenting on 11/14/2021 for comprehensive medical examination. Current medical complaints include: weight loss and STD screening  She currently lives with: husband  Menopausal Symptoms: had hysterectomy  WEIGHT LOSS: Vickey is interested in weight loss with [facility], has heard about it and has co workers taking this.  Current weight as above, with a goal weight of 170-180 lbs.  She had gotten back down to 208 lbs, was at 220 lbs, over past 6 months by focusing on diet and cutting out sodas + cutting out snacking when bored & working out.  Feels stuck at this time at 208 lbs.  No family history of thyroid cancer and no personal/family history of pancreatitis.    STD SCREENING Would like full screening today. Sexual activity:  In a Monogamous Relationship Contraception: no Recent unprotected intercourse: yes History of sexually transmitted diseases: no Previous sexually transmitted disease screening: yes Lifetime sexual partners: 7 Genital lesions: no Dysuria: no Swollen lymph nodes: no Fevers: no Rash: no   Depression Screen done today and results listed below: Continues without medication at this time.    11/14/2021    1:40 PM 11/14/2021    1:09 PM 01/10/2021    9:12 AM 12/06/2020   10:50 AM 11/04/2020    3:56 PM  Depression screen PHQ 2/9  Decreased Interest '1 1 2 2 3  '$ Down, Depressed, Hopeless 0 0 '2 1 1  '$ PHQ - 2 Score '1 1 4 3 4  '$ Altered sleeping '1  3 3 3  '$ Tired, decreased energy '3 3 3 3 3  '$ Change in appetite '2 2 1 3 3  '$ Feeling bad or failure about yourself  '2 2 3 3 1  '$ Trouble concentrating '1 1 2 3 3  '$ Moving slowly or fidgety/restless 2 2 0 1 1  Suicidal thoughts  0 0 0 0 0  PHQ-9 Score '12  16 19 18  '$ Difficult doing work/chores Not difficult at all  Somewhat difficult Extremely dIfficult Somewhat difficult   Functional Status Survey: Is the patient deaf or have difficulty hearing?: No Does the patient have difficulty seeing, even when wearing glasses/contacts?: No Does the patient have difficulty concentrating, remembering, or making decisions?: No Does the patient have difficulty walking or climbing stairs?: No Does the patient have difficulty dressing or bathing?: No Does the patient have difficulty doing errands alone such as visiting a doctor's office or shopping?: No      12/28/2019    3:38 PM 08/05/2020    3:07 PM 12/06/2020   10:49 AM 11/14/2021    1:09 PM 11/14/2021    1:31 PM  [place] in the past year? 0 0 0 0 0  Was there an injury with Fall? 0  0 0 0  Fall Risk Category Calculator 0  0 0 0  Fall Risk Category Low  Low Low Low  Patient Fall Risk Level Low fall risk   Low fall risk Low fall risk  Patient at Risk for Falls Due to   No Fall Risks No Fall Risks No  Fall Risks  Fall risk Follow up   Falls evaluation completed Falls evaluation completed Falls evaluation completed     Past Medical History:  Past Medical History:  Diagnosis Date   Anxiety    Bipolar disorder ([place])    past hx of   Complication of anesthesia    "woke up during surgery"   Depression    Heavy periods    Incontinence of urine    Increased BMI    Indigestion    PTSD (post-traumatic stress disorder)    Tobacco user     Surgical History:  Past Surgical History:  Procedure Laterality Date   CESAREAN SECTION  2010   LAPAROSCOPIC VAGINAL HYSTERECTOMY WITH SALPINGECTOMY Bilateral 12/16/2015   Procedure: LAPAROSCOPIC ASSISTED VAGINAL HYSTERECTOMY WITH BILATERAL SALPINGECTOMY;  Surgeon: Brayton Mars, MD;  Location: [facility] ORS;  Service: Gynecology;  Laterality: Bilateral;   TOTAL ABDOMINAL HYSTERECTOMY W/ BILATERAL SALPINGOOPHORECTOMY  2017    TUBAL LIGATION  2011    Medications:  Current Outpatient Medications on File Prior to Visit  Medication Sig   albuterol (VENTOLIN HFA) 108 (90 Base) MCG/ACT inhaler Inhale 2 puffs into the lungs every 6 (six) hours as needed for wheezing or shortness of breath.   baclofen (LIORESAL) 10 MG tablet Take 1 tablet (10 mg total) by mouth 3 (three) times daily.   ibuprofen (ADVIL) 800 MG tablet Take 1 tablet (800 mg total) by mouth every 8 (eight) hours as needed.   metoprolol succinate (TOPROL XL) 25 MG 24 hr tablet Take 1 tablet (25 mg total) by mouth daily.   No current facility-administered medications on file prior to visit.    Allergies:  Allergies  Allergen Reactions   Amoxicillin Rash and Other (See Comments)    GI upset    Social History:  Social History   Socioeconomic History   Marital status: Married    Spouse name: Not on file   Number of children: Not on file   Years of education: Not on file   Highest education level: Not on file  Occupational History   Not on file  Tobacco Use   Smoking status: Every Day    Packs/day: 0.50    Years: 11.00    Pack years: 5.50    Types: Cigarettes   Smokeless tobacco: Never  Vaping Use   Vaping Use: Never used  Substance and Sexual Activity   Alcohol use: Yes    Alcohol/week: 0.0 - 6.0 standard drinks    Comment: occasionally; beer   Drug use: No   Sexual activity: Yes    Birth control/protection: Surgical  Other Topics Concern   Not on file  Social History Narrative   Not on file   Social Determinants of Health   Financial Resource Strain: Not on file  Food Insecurity: Not on file  Transportation Needs: Not on file  Physical Activity: Not on file  Stress: Not on file  Social Connections: Not on file  Intimate Partner Violence: Not on file   Social History   Tobacco Use  Smoking Status Every Day   Packs/day: 0.50   Years: 11.00   Pack years: 5.50   Types: Cigarettes  Smokeless Tobacco Never   Social  History   Substance and Sexual Activity  Alcohol Use Yes   Alcohol/week: 0.0 - 6.0 standard drinks   Comment: occasionally; beer    Family History:  Family History  Problem Relation Age of Onset   Diabetes Mother    Mental illness Mother  Hyperlipidemia Mother    Hypertension Mother    Diabetes Maternal Grandmother    Heart disease Maternal Grandfather    Cancer Neg Hx     Past medical history, surgical history, medications, allergies, family history and social history reviewed with patient today and changes made to appropriate areas of the chart.   ROS All other ROS negative except what is listed above and in the HPI.      Objective:    BP 110/77   Pulse 83   Temp 97.8 F (36.6 C) (Oral)   Ht '5\' 3"'$  (1.6 m)   Wt 208 lb 6.4 oz (94.5 kg)   LMP 11/22/2015 (Approximate)   SpO2 98%   BMI 36.92 kg/m   Wt Readings from Last 3 Encounters:  11/14/21 208 lb 6.4 oz (94.5 kg)  08/22/21 211 lb (95.7 kg)  07/11/21 209 lb 9.6 oz (95.1 kg)    Physical Exam Vitals and nursing note reviewed.  Constitutional:      General: She is awake. She is not in acute distress.    Appearance: She is well-developed and well-groomed. She is obese. She is not ill-appearing or toxic-appearing.  HENT:     Head: Normocephalic and atraumatic.     Right Ear: Hearing, tympanic membrane, ear canal and external ear normal. No drainage.     Left Ear: Hearing, tympanic membrane, ear canal and external ear normal. No drainage.     Nose: Nose normal.     Right Sinus: No maxillary sinus tenderness or frontal sinus tenderness.     Left Sinus: No maxillary sinus tenderness or frontal sinus tenderness.     Mouth/Throat:     Mouth: Mucous membranes are moist.     Pharynx: Oropharynx is clear. Uvula midline. No pharyngeal swelling, oropharyngeal exudate or posterior oropharyngeal erythema.  Eyes:     General: Lids are normal.        Right eye: No discharge.        Left eye: No discharge.     Extraocular  Movements: Extraocular movements intact.     Conjunctiva/sclera: Conjunctivae normal.     Pupils: Pupils are equal, round, and reactive to light.     Visual Fields: Right eye visual fields normal and left eye visual fields normal.  Neck:     Thyroid: No thyromegaly.     Vascular: No carotid bruit.     Trachea: Trachea normal.  Cardiovascular:     Rate and Rhythm: Normal rate and regular rhythm.     Heart sounds: Normal heart sounds. No murmur heard.   No gallop.  Pulmonary:     Effort: Pulmonary effort is normal. No accessory muscle usage or respiratory distress.     Breath sounds: Normal breath sounds.  Chest:  Breasts:    Right: Normal.     Left: Normal.  Abdominal:     General: Bowel sounds are normal.     Palpations: Abdomen is soft. There is no hepatomegaly or splenomegaly.     Tenderness: There is no abdominal tenderness.  Musculoskeletal:        General: Normal range of motion.     Cervical back: Normal range of motion and neck supple.     Right lower leg: No edema.     Left lower leg: No edema.  Lymphadenopathy:     Head:     Right side of head: No submental, submandibular, tonsillar, preauricular or posterior auricular adenopathy.     Left side of head: No submental,  submandibular, tonsillar, preauricular or posterior auricular adenopathy.     Cervical: No cervical adenopathy.     Upper Body:     Right upper body: No supraclavicular, axillary or pectoral adenopathy.     Left upper body: No supraclavicular, axillary or pectoral adenopathy.  Skin:    General: Skin is warm and dry.     Capillary Refill: Capillary refill takes less than 2 seconds.     Findings: No rash.  Neurological:     Mental Status: She is alert and oriented to person, place, and time.     Gait: Gait is intact.     Deep Tendon Reflexes: Reflexes are normal and symmetric.     Reflex Scores:      Brachioradialis reflexes are 2+ on the right side and 2+ on the left side.      Patellar reflexes are  2+ on the right side and 2+ on the left side. Psychiatric:        Attention and Perception: Attention normal.        Mood and Affect: Mood normal.        Speech: Speech normal.        Behavior: Behavior normal. Behavior is cooperative.        Thought Content: Thought content normal.        Judgment: Judgment normal.    Results for orders placed or performed in visit on 08/04/21  ECHOCARDIOGRAM COMPLETE  Result Value Ref Range   AR max vel 2.31 cm2   AV Peak grad 5.5 mmHg   Ao pk vel 1.17 m/s   S' Lateral 3.20 cm   Area-P 1/2 4.21 cm2   AV Area VTI 2.39 cm2   AV Mean grad 3.0 mmHg   Single Plane A4C EF 61.8 %   Single Plane A2C EF 69.7 %   Calc EF 65.1 %   AV Area mean vel 2.34 cm2      Assessment & Plan:   Problem List Items Addressed This Visit       Other   Depression - Primary    Chronic, stable without medication at this time.  PHQ9 = 12, but wishes to continue without medication at this time.  Denies SI/HI.         Obesity    BMI 36.92 with elevation in LDL noted on labs in 2020.  Has been trying over past 6 months to lose weight, with 20 pounds lost and now is stuck, she would like to get to 170-180 range. At this time educated on Wegovy which she wishes to start.  No family history of thyroid cancer or personal history of pancreatitis.  Will start Wegovy 0.25 MG weekly, instructed her on how to inject and showed via demonstration pen.  Recommended eating smaller high protein, low fat meals more frequently and exercising 30 mins a day 5 times a week with a goal of 10-15lb weight loss in the next 3 months. Patient voiced their understanding and motivation to adhere to these recommendations.        Relevant Medications   Semaglutide-Weight Management (WEGOVY) 0.25 MG/0.5ML SOAJ   Other Relevant Orders   CBC with Differential/Platelet   Comprehensive metabolic panel   TSH   Other Visit Diagnoses     Screen for STD (sexually transmitted disease)       Full panel  for STD screening today as requested by patient.   Relevant Orders   HIV Antibody (routine testing w rflx)   GC/Chlamydia  Probe Amp   RPR   HSV(herpes simplex vrs) 1+2 ab-IgG   Encounter for lipid screening for cardiovascular disease       Lipid panel today.   Relevant Orders   Lipid Panel w/o Chol/HDL Ratio   Encounter for annual physical exam       Annual physical today with labs and health maintenance reviewed with patient.        Follow up plan: Return in about 5 weeks (around 12/19/2021) for Weight Management with Santiago Glad -- started [facility].   LABORATORY TESTING:  - Pap smear: history of total hysterectomy -- ovaries only  IMMUNIZATIONS:   - Tdap: Tetanus vaccination status reviewed: last tetanus booster within 10 years. - Influenza: Up to date - Pneumovax: Not applicable - Prevnar: Not applicable - COVID: Up to date - HPV: Not applicable - Shingrix vaccine: Not applicable  SCREENING: -Mammogram: Not applicable  - Colonoscopy: Not applicable  - Bone Density: Not applicable  -Hearing Test: Not applicable  -Spirometry: Not applicable   PATIENT COUNSELING:   Advised to take 1 mg of folate supplement per day if capable of pregnancy.   Sexuality: Discussed sexually transmitted diseases, partner selection, use of condoms, avoidance of unintended pregnancy  and contraceptive alternatives.   Advised to avoid cigarette smoking.  I discussed with the patient that most people either abstain from alcohol or drink within safe limits (<=14/week and <=4 drinks/occasion for males, <=7/weeks and <= 3 drinks/occasion for females) and that the risk for alcohol disorders and other health effects rises proportionally with the number of drinks per week and how often a drinker exceeds daily limits.  Discussed cessation/primary prevention of drug use and availability of treatment for abuse.   Diet: Encouraged to adjust caloric intake to maintain  or achieve ideal body weight, to reduce  intake of dietary saturated fat and total fat, to limit sodium intake by avoiding high sodium foods and not adding table salt, and to maintain adequate dietary potassium and calcium preferably from fresh fruits, vegetables, and low-fat dairy products.    Stressed the importance of regular exercise  Injury prevention: Discussed safety belts, safety helmets, smoke detector, smoking near bedding or upholstery.   Dental health: Discussed importance of regular tooth brushing, flossing, and dental visits.    NEXT PREVENTATIVE PHYSICAL DUE IN 1 YEAR. Return in about 5 weeks (around 12/19/2021) for Weight Management with Santiago Glad -- started [facility].

## 2021-11-14 NOTE — Assessment & Plan Note (Signed)
Chronic, stable without medication at this time.  PHQ9 = 12, but wishes to continue without medication at this time.  Denies SI/HI.

## 2021-11-14 NOTE — Assessment & Plan Note (Signed)
BMI 36.92 with elevation in LDL noted on labs in 2020.  Has been trying over past 6 months to lose weight, with 20 pounds lost and now is stuck, she would like to get to 170-180 range. At this time educated on Wegovy which she wishes to start.  No family history of thyroid cancer or personal history of pancreatitis.  Will start Wegovy 0.25 MG weekly, instructed her on how to inject and showed via demonstration pen.  Recommended eating smaller high protein, low fat meals more frequently and exercising 30 mins a day 5 times a week with a goal of 10-15lb weight loss in the next 3 months. Patient voiced their understanding and motivation to adhere to these recommendations.

## 2021-11-15 LAB — COMPREHENSIVE METABOLIC PANEL
ALT: 15 IU/L (ref 0–32)
AST: 14 IU/L (ref 0–40)
Albumin/Globulin Ratio: 1.9 (ref 1.2–2.2)
Albumin: 4.1 g/dL (ref 3.8–4.8)
Alkaline Phosphatase: 70 IU/L (ref 44–121)
BUN/Creatinine Ratio: 11 (ref 9–23)
BUN: 9 mg/dL (ref 6–20)
Bilirubin Total: 0.3 mg/dL (ref 0.0–1.2)
CO2: 23 mmol/L (ref 20–29)
Calcium: 8.9 mg/dL (ref 8.7–10.2)
Chloride: 107 mmol/L — ABNORMAL HIGH (ref 96–106)
Creatinine, Ser: 0.82 mg/dL (ref 0.57–1.00)
Globulin, Total: 2.2 g/dL (ref 1.5–4.5)
Glucose: 77 mg/dL (ref 70–99)
Potassium: 4.3 mmol/L (ref 3.5–5.2)
Sodium: 144 mmol/L (ref 134–144)
Total Protein: 6.3 g/dL (ref 6.0–8.5)
eGFR: 96 mL/min/{1.73_m2} (ref >59)

## 2021-11-15 LAB — CBC WITH DIFFERENTIAL/PLATELET
Basophils Absolute: 0 10*3/uL (ref 0.0–0.2)
Basos: 1 %
EOS (ABSOLUTE): 0.1 10*3/uL (ref 0.0–0.4)
Eos: 1 %
Hematocrit: 42.3 % (ref 34.0–46.6)
Hemoglobin: 14.7 g/dL (ref 11.1–15.9)
Immature Grans (Abs): 0 10*3/uL (ref 0.0–0.1)
Immature Granulocytes: 0 %
Lymphocytes Absolute: 2.1 10*3/uL (ref 0.7–3.1)
Lymphs: 46 %
MCH: 31.5 pg (ref 26.6–33.0)
MCHC: 34.8 g/dL (ref 31.5–35.7)
MCV: 91 fL (ref 79–97)
Monocytes Absolute: 0.3 10*3/uL (ref 0.1–0.9)
Monocytes: 7 %
Neutrophils Absolute: 2.1 10*3/uL (ref 1.4–7.0)
Neutrophils: 45 %
Platelets: 286 10*3/uL (ref 150–450)
RBC: 4.67 x10E6/uL (ref 3.77–5.28)
RDW: 13 % (ref 11.7–15.4)
WBC: 4.6 10*3/uL (ref 3.4–10.8)

## 2021-11-15 LAB — HSV(HERPES SIMPLEX VRS) I + II AB-IGG
HSV 1 Glycoprotein G Ab, IgG: <0.91 index (ref 0.00–0.90)
HSV 2 IgG, Type Spec: <0.91 index (ref 0.00–0.90)

## 2021-11-15 LAB — LIPID PANEL W/O CHOL/HDL RATIO
Cholesterol, Total: 201 mg/dL — ABNORMAL HIGH (ref 100–199)
HDL: 63 mg/dL (ref >39)
LDL Chol Calc (NIH): 132 mg/dL — ABNORMAL HIGH (ref 0–99)
Triglycerides: 34 mg/dL (ref 0–149)
VLDL Cholesterol Cal: 6 mg/dL (ref 5–40)

## 2021-11-15 LAB — HIV ANTIBODY (ROUTINE TESTING W REFLEX): HIV Screen 4th Generation wRfx: NONREACTIVE

## 2021-11-15 LAB — RPR: RPR Ser Ql: NONREACTIVE

## 2021-11-15 LAB — TSH: TSH: 1.06 u[IU]/mL (ref 0.450–4.500)

## 2021-11-16 ENCOUNTER — Encounter: Payer: Self-pay | Admitting: Nurse Practitioner

## 2021-11-16 DIAGNOSIS — E7800 Pure hypercholesterolemia, unspecified: Secondary | ICD-10-CM | POA: Insufficient documentation

## 2021-11-16 NOTE — Progress Notes (Signed)
Contacted via [place] morning Kat, your labs have returned.  Overall they look great.  Kidney function, creatinine and eGFR, remains normal, as is liver function, AST and ALT.  CBC shows no infection.  Thyroid, TSH, lab normal.  Current STD screening is negative, waiting on gonorrhea and chlamydia.  Your cholesterol is high, but recommendations to make lifestyle changes. Your LDL is above normal. The LDL is the bad cholesterol. Over time and in combination with inflammation and other factors, this contributes to plaque which in turn may lead to stroke and/or heart attack down the road. Sometimes high LDL is primarily genetic, and people might be eating all the right foods but still have high numbers. Other times, there is room for improvement in one's diet and eating healthier can bring this number down and potentially reduce one's risk of heart attack and/or stroke.   To reduce your LDL, Remember - more fruits and vegetables, more fish, and limit red meat and dairy products. More soy, nuts, beans, barley, lentils, oats and plant sterol ester enriched margarine instead of butter. I also encourage eliminating sugar and processed food. Remember, shop on the outside of the grocery store and visit your [employer]. If you would like to talk with me about dietary changes for your cholesterol, please let me know. We should recheck your cholesterol in 12 months.  Any questions? Keep being amazing!!  Thank you for allowing me to participate in your care.  I appreciate you. Kindest regards, Tesia Lybrand

## 2021-11-18 ENCOUNTER — Other Ambulatory Visit: Payer: Self-pay

## 2021-11-18 LAB — GC/CHLAMYDIA PROBE AMP
Chlamydia trachomatis, NAA: NEGATIVE
Neisseria Gonorrhoeae by PCR: NEGATIVE

## 2021-11-18 NOTE — Progress Notes (Signed)
Contacted via MyChart   Good morning Laura [name], your gonorrhea and chlamydia testing returned negative:)

## 2021-11-19 ENCOUNTER — Other Ambulatory Visit: Payer: Self-pay

## 2021-11-20 ENCOUNTER — Other Ambulatory Visit: Payer: Self-pay

## 2021-11-26 ENCOUNTER — Other Ambulatory Visit: Payer: Self-pay

## 2021-12-15 ENCOUNTER — Encounter: Payer: Self-pay | Admitting: Nurse Practitioner

## 2021-12-19 ENCOUNTER — Ambulatory Visit: Payer: No Typology Code available for payment source | Admitting: Nurse Practitioner

## 2021-12-31 ENCOUNTER — Other Ambulatory Visit: Payer: Self-pay

## 2022-01-01 ENCOUNTER — Other Ambulatory Visit: Payer: Self-pay

## 2022-01-22 NOTE — Progress Notes (Deleted)
LMP 10/23/2015    Subjective:    Patient ID: Laura [name], female    DOB: [birth year], 36 y.o.   MRN: [id number]  HPI: LOVEAH LIKE is a 35 y.o. female presenting on 01/26/2022 for comprehensive medical examination. Current medical complaints include: weight loss and STD screening  She currently lives with: husband  Menopausal Symptoms: had hysterectomy  WEIGHT LOSS: Henny is interested in weight loss with [facility], has heard about it and has co workers taking this.  Current weight as above, with a goal weight of 170-180 lbs.  She had gotten back down to 208 lbs, was at 220 lbs, over past 6 months by focusing on diet and cutting out sodas + cutting out snacking when bored & working out.  Feels stuck at this time at 208 lbs.  No family history of thyroid cancer and no personal/family history of pancreatitis.    STD SCREENING Would like full screening today. Sexual activity:  In a Monogamous Relationship Contraception: no Recent unprotected intercourse: yes History of sexually transmitted diseases: no Previous sexually transmitted disease screening: yes Lifetime sexual partners: 7 Genital lesions: no Dysuria: no Swollen lymph nodes: no Fevers: no Rash: no   Depression Screen done today and results listed below: Continues without medication at this time.    11/14/2021    1:40 PM 11/14/2021    1:09 PM 01/10/2021    9:12 AM 12/06/2020   10:50 AM 11/04/2020    3:56 PM  Depression screen PHQ 2/9  Decreased Interest _0 Down, Depressed, Hopeless 0 0 _1 PHQ - 2 Score _2 Altered sleeping _3 Tired, decreased energy _4 Change in appetite _5 Feeling bad or failure about yourself  _6 Trouble concentrating _7 Moving slowly or fidgety/restless 2 2 0 1 1  Suicidal thoughts 0 0 0 0 0  PHQ-9 Score _8 Difficult doing work/chores Not difficult at all  Somewhat difficult Extremely dIfficult Somewhat difficult    Functional Status Survey:        12/28/2019    3:38 PM 08/05/2020    3:07 PM 12/06/2020   10:49 AM 11/14/2021    1:09 PM 11/14/2021    1:31 PM  Fall Risk  Falls in the past year? 0 0 0 0 0  Was there an injury with Fall? 0  0 0 0  Fall Risk Category Calculator 0  0 0 0  Fall Risk Category Low  Low Low Low  Patient Fall Risk Level Low fall risk   Low fall risk Low fall risk  Patient at Risk for Falls Due to   No Fall Risks No Fall Risks No Fall Risks  Fall risk Follow up   Falls evaluation completed Falls evaluation completed Falls evaluation completed     Past Medical History:  Past Medical History:  Diagnosis Date  . Anxiety   . Bipolar disorder ([place])    past hx of  . Complication of anesthesia    "woke up during surgery"  . Depression   . Heavy periods   . Incontinence of urine   . Increased BMI   . Indigestion   . PTSD (post-traumatic stress disorder)   . Tobacco user     Surgical History:  Past Surgical History:  Procedure Laterality  Date  . CESAREAN SECTION  2010  . LAPAROSCOPIC VAGINAL HYSTERECTOMY WITH SALPINGECTOMY Bilateral 12/16/2015   Procedure: LAPAROSCOPIC ASSISTED VAGINAL HYSTERECTOMY WITH BILATERAL SALPINGECTOMY;  Surgeon: Brayton Mars, MD;  Location: [facility] ORS;  Service: Gynecology;  Laterality: Bilateral;  . TOTAL ABDOMINAL HYSTERECTOMY W/ BILATERAL SALPINGOOPHORECTOMY  2017  . TUBAL LIGATION  2011    Medications:  Current Outpatient Medications on File Prior to Visit  Medication Sig  . albuterol (VENTOLIN HFA) 108 (90 Base) MCG/ACT inhaler Inhale 2 puffs into the lungs every 6 (six) hours as needed for wheezing or shortness of breath.  . baclofen (LIORESAL) 10 MG tablet Take 1 tablet (10 mg total) by mouth 3 (three) times daily.  Marland Kitchen ibuprofen (ADVIL) 800 MG tablet Take 1 tablet (800 mg total) by mouth every 8 (eight) hours as needed.  . metoprolol succinate (TOPROL XL) 25 MG 24 hr tablet Take 1 tablet (25 mg total) by mouth daily.  .  Semaglutide-Weight Management (WEGOVY) 0.25 MG/0.5ML SOAJ Inject 0.25 mg into the skin once a week.   No current facility-administered medications on file prior to visit.    Allergies:  Allergies  Allergen Reactions  . Amoxicillin Rash and Other (See Comments)    GI upset    Social History:  Social History   Socioeconomic History  . Marital status: Married    Spouse name: Not on file  . Number of children: Not on file  . Years of education: Not on file  . Highest education level: Not on file  Occupational History  . Not on file  Tobacco Use  . Smoking status: Every Day    Packs/day: 0.50    Years: 11.00    Total pack years: 5.50    Types: Cigarettes  . Smokeless tobacco: Never  Vaping Use  . Vaping Use: Never used  Substance and Sexual Activity  . Alcohol use: Yes    Alcohol/week: 0.0 - 6.0 standard drinks of alcohol    Comment: occasionally; beer  . Drug use: No  . Sexual activity: Yes    Birth control/protection: Surgical  Other Topics Concern  . Not on file  Social History Narrative  . Not on file   Social Determinants of Health   Financial Resource Strain: Not on file  Food Insecurity: Not on file  Transportation Needs: Not on file  Physical Activity: Not on file  Stress: Not on file  Social Connections: Not on file  Intimate Partner Violence: Not on file   Social History   Tobacco Use  Smoking Status Every Day  . Packs/day: 0.50  . Years: 11.00  . Total pack years: 5.50  . Types: Cigarettes  Smokeless Tobacco Never   Social History   Substance and Sexual Activity  Alcohol Use Yes  . Alcohol/week: 0.0 - 6.0 standard drinks of alcohol   Comment: occasionally; beer    Family History:  Family History  Problem Relation Age of Onset  . Diabetes Mother   . Mental illness Mother   . Hyperlipidemia Mother   . Hypertension Mother   . Diabetes Maternal Grandmother   . Heart disease Maternal Grandfather   . Cancer Neg Hx     Past medical  history, surgical history, medications, allergies, family history and social history reviewed with patient today and changes made to appropriate areas of the chart.   ROS All other ROS negative except what is listed above and in the HPI.      Objective:    LMP 10/23/2015  Wt Readings from Last 3 Encounters:  11/14/21 208 lb 6.4 oz (94.5 kg)  08/22/21 211 lb (95.7 kg)  07/11/21 209 lb 9.6 oz (95.1 kg)    Physical Exam Vitals and nursing note reviewed.  Constitutional:      General: She is awake. She is not in acute distress.    Appearance: She is well-developed and well-groomed. She is obese. She is not ill-appearing or toxic-appearing.  HENT:     Head: Normocephalic and atraumatic.     Right Ear: Hearing, tympanic membrane, ear canal and external ear normal. No drainage.     Left Ear: Hearing, tympanic membrane, ear canal and external ear normal. No drainage.     Nose: Nose normal.     Right Sinus: No maxillary sinus tenderness or frontal sinus tenderness.     Left Sinus: No maxillary sinus tenderness or frontal sinus tenderness.     Mouth/Throat:     Mouth: Mucous membranes are moist.     Pharynx: Oropharynx is clear. Uvula midline. No pharyngeal swelling, oropharyngeal exudate or posterior oropharyngeal erythema.  Eyes:     General: Lids are normal.        Right eye: No discharge.        Left eye: No discharge.     Extraocular Movements: Extraocular movements intact.     Conjunctiva/sclera: Conjunctivae normal.     Pupils: Pupils are equal, round, and reactive to light.     Visual Fields: Right eye visual fields normal and left eye visual fields normal.  Neck:     Thyroid: No thyromegaly.     Vascular: No carotid bruit.     Trachea: Trachea normal.  Cardiovascular:     Rate and Rhythm: Normal rate and regular rhythm.     Heart sounds: Normal heart sounds. No murmur heard.    No gallop.  Pulmonary:     Effort: Pulmonary effort is normal. No accessory muscle usage or  respiratory distress.     Breath sounds: Normal breath sounds.  Chest:  Breasts:    Right: Normal.     Left: Normal.  Abdominal:     General: Bowel sounds are normal.     Palpations: Abdomen is soft. There is no hepatomegaly or splenomegaly.     Tenderness: There is no abdominal tenderness.  Musculoskeletal:        General: Normal range of motion.     Cervical back: Normal range of motion and neck supple.     Right lower leg: No edema.     Left lower leg: No edema.  Lymphadenopathy:     Head:     Right side of head: No submental, submandibular, tonsillar, preauricular or posterior auricular adenopathy.     Left side of head: No submental, submandibular, tonsillar, preauricular or posterior auricular adenopathy.     Cervical: No cervical adenopathy.     Upper Body:     Right upper body: No supraclavicular, axillary or pectoral adenopathy.     Left upper body: No supraclavicular, axillary or pectoral adenopathy.  Skin:    General: Skin is warm and dry.     Capillary Refill: Capillary refill takes less than 2 seconds.     Findings: No rash.  Neurological:     Mental Status: She is alert and oriented to person, place, and time.     Gait: Gait is intact.     Deep Tendon Reflexes: Reflexes are normal and symmetric.     Reflex Scores:  Brachioradialis reflexes are 2+ on the right side and 2+ on the left side.      Patellar reflexes are 2+ on the right side and 2+ on the left side. Psychiatric:        Attention and Perception: Attention normal.        Mood and Affect: Mood normal.        Speech: Speech normal.        Behavior: Behavior normal. Behavior is cooperative.        Thought Content: Thought content normal.        Judgment: Judgment normal.    Results for orders placed or performed in visit on 11/14/21  GC/Chlamydia Probe Amp   Specimen: Urine   UR  Result Value Ref Range   Chlamydia trachomatis, NAA Negative Negative   Neisseria Gonorrhoeae by PCR Negative  Negative  CBC with Differential/Platelet  Result Value Ref Range   WBC 4.6 3.4 - 10.8 x10E3/uL   RBC 4.67 3.77 - 5.28 x10E6/uL   Hemoglobin 14.7 11.1 - 15.9 g/dL   Hematocrit 42.3 34.0 - 46.6 %   MCV 91 79 - 97 fL   MCH 31.5 26.6 - 33.0 pg   MCHC 34.8 31.5 - 35.7 g/dL   RDW 13.0 11.7 - 15.4 %   Platelets 286 150 - 450 x10E3/uL   Neutrophils 45 Not Estab. %   Lymphs 46 Not Estab. %   Monocytes 7 Not Estab. %   Eos 1 Not Estab. %   Basos 1 Not Estab. %   Neutrophils Absolute 2.1 1.4 - 7.0 x10E3/uL   Lymphocytes Absolute 2.1 0.7 - 3.1 x10E3/uL   Monocytes Absolute 0.3 0.1 - 0.9 x10E3/uL   EOS (ABSOLUTE) 0.1 0.0 - 0.4 x10E3/uL   Basophils Absolute 0.0 0.0 - 0.2 x10E3/uL   Immature Granulocytes 0 Not Estab. %   Immature Grans (Abs) 0.0 0.0 - 0.1 x10E3/uL  Comprehensive metabolic panel  Result Value Ref Range   Glucose 77 70 - 99 mg/dL   BUN 9 6 - 20 mg/dL   Creatinine, Ser 0.82 0.57 - 1.00 mg/dL   eGFR 96 >59 mL/min/1.73   BUN/Creatinine Ratio 11 9 - 23   Sodium 144 134 - 144 mmol/L   Potassium 4.3 3.5 - 5.2 mmol/L   Chloride 107 (H) 96 - 106 mmol/L   CO2 23 20 - 29 mmol/L   Calcium 8.9 8.7 - 10.2 mg/dL   Total Protein 6.3 6.0 - 8.5 g/dL   Albumin 4.1 3.8 - 4.8 g/dL   Globulin, Total 2.2 1.5 - 4.5 g/dL   Albumin/Globulin Ratio 1.9 1.2 - 2.2   Bilirubin Total 0.3 0.0 - 1.2 mg/dL   Alkaline Phosphatase 70 44 - 121 IU/L   AST 14 0 - 40 IU/L   ALT 15 0 - 32 IU/L  Lipid Panel w/o Chol/HDL Ratio  Result Value Ref Range   Cholesterol, Total 201 (H) 100 - 199 mg/dL   Triglycerides 34 0 - 149 mg/dL   HDL 63 >39 mg/dL   VLDL Cholesterol Cal 6 5 - 40 mg/dL   LDL Chol Calc (NIH) 132 (H) 0 - 99 mg/dL  TSH  Result Value Ref Range   TSH 1.060 0.450 - 4.500 uIU/mL  HIV Antibody (routine testing w rflx)  Result Value Ref Range   HIV Screen 4th Generation wRfx Non Reactive Non Reactive  RPR  Result Value Ref Range   RPR Ser Ql Non Reactive Non Reactive  HSV(herpes simplex vrs)  1+2 ab-IgG  Result Value Ref Range   HSV 1 Glycoprotein G Ab, IgG <0.91 0.00 - 0.90 index   HSV 2 IgG, Type Spec <0.91 0.00 - 0.90 index      Assessment & Plan:   Problem List Items Addressed This Visit      Other   Obesity - Primary     Follow up plan: No follow-ups on file.   LABORATORY TESTING:  - Pap smear: history of total hysterectomy -- ovaries only  IMMUNIZATIONS:   - Tdap: Tetanus vaccination status reviewed: last tetanus booster within 10 years. - Influenza: Up to date - Pneumovax: Not applicable - Prevnar: Not applicable - COVID: Up to date - HPV: Not applicable - Shingrix vaccine: Not applicable  SCREENING: -Mammogram: Not applicable  - Colonoscopy: Not applicable  - Bone Density: Not applicable  -Hearing Test: Not applicable  -Spirometry: Not applicable   PATIENT COUNSELING:   Advised to take 1 mg of folate supplement per day if capable of pregnancy.   Sexuality: Discussed sexually transmitted diseases, partner selection, use of condoms, avoidance of unintended pregnancy  and contraceptive alternatives.   Advised to avoid cigarette smoking.  I discussed with the patient that most people either abstain from alcohol or drink within safe limits (<=14/week and <=4 drinks/occasion for males, <=7/weeks and <= 3 drinks/occasion for females) and that the risk for alcohol disorders and other health effects rises proportionally with the number of drinks per week and how often a drinker exceeds daily limits.  Discussed cessation/primary prevention of drug use and availability of treatment for abuse.   Diet: Encouraged to adjust caloric intake to maintain  or achieve ideal body weight, to reduce intake of dietary saturated fat and total fat, to limit sodium intake by avoiding high sodium foods and not adding table salt, and to maintain adequate dietary potassium and calcium preferably from fresh fruits, vegetables, and low-fat dairy products.    Stressed the  importance of regular exercise  Injury prevention: Discussed safety belts, safety helmets, smoke detector, smoking near bedding or upholstery.   Dental health: Discussed importance of regular tooth brushing, flossing, and dental visits.    NEXT PREVENTATIVE PHYSICAL DUE IN 1 YEAR. No follow-ups on file.

## 2022-01-26 ENCOUNTER — Ambulatory Visit (INDEPENDENT_AMBULATORY_CARE_PROVIDER_SITE_OTHER): Payer: No Typology Code available for payment source | Admitting: Nurse Practitioner

## 2022-01-26 ENCOUNTER — Other Ambulatory Visit: Payer: Self-pay

## 2022-01-26 ENCOUNTER — Encounter: Payer: Self-pay | Admitting: Nurse Practitioner

## 2022-01-26 VITALS — BP 116/82 | HR 103 | Temp 98.1°F | Wt 201.4 lb

## 2022-01-26 DIAGNOSIS — E6609 Other obesity due to excess calories: Secondary | ICD-10-CM | POA: Diagnosis not present

## 2022-01-26 DIAGNOSIS — Z6839 Body mass index (BMI) 39.0-39.9, adult: Secondary | ICD-10-CM

## 2022-01-26 MED ORDER — WEGOVY 0.25 MG/0.5ML ~~LOC~~ SOAJ
0.5000 mg | SUBCUTANEOUS | 0 refills | Status: DC
  Filled 2022-01-26: qty 6, fill #0

## 2022-01-26 MED ORDER — WEGOVY 0.5 MG/0.5ML ~~LOC~~ SOAJ
0.5000 mg | SUBCUTANEOUS | 0 refills | Status: DC
Start: 2022-01-26 — End: 2022-02-20
  Filled 2022-01-26: qty 2, 28d supply, fill #0

## 2022-01-26 NOTE — Progress Notes (Signed)
BP 116/82   Pulse (!) 103   Temp 98.1 F (36.7 C) (Oral)   Wt 201 lb 6.4 oz (91.4 kg)   LMP 10/23/2015   SpO2 99%   BMI 35.68 kg/m    Subjective:    Patient ID: Laura [name], female    DOB: [birth year], 36 y.o.   MRN: [id number]  HPI: Laura [name] is a 36 y.o. female  Chief Complaint  Patient presents with   Weight Check    Follow up on Wegovy, experiencing common side effects.    WEIGHT LOSS: Patient states it is going okay.  By the second week on Wegovy she was having some fatigue.  If she ate a heavy or greasy meal she had some abdominal pain. She has lost 7lbs so far on [facility].  She is ready to do to the 0.7m.     Relevant past medical, surgical, family and social history reviewed and updated as indicated. Interim medical history since our last visit reviewed. Allergies and medications reviewed and updated.  Review of Systems  Gastrointestinal:  Positive for abdominal pain.    Per HPI unless specifically indicated above     Objective:    BP 116/82   Pulse (!) 103   Temp 98.1 F (36.7 C) (Oral)   Wt 201 lb 6.4 oz (91.4 kg)   LMP 10/23/2015   SpO2 99%   BMI 35.68 kg/m   Wt Readings from Last 3 Encounters:  01/26/22 201 lb 6.4 oz (91.4 kg)  11/14/21 208 lb 6.4 oz (94.5 kg)  08/22/21 211 lb (95.7 kg)    Physical Exam Vitals and nursing note reviewed.  Constitutional:      General: She is not in acute distress.    Appearance: Normal appearance. She is obese. She is not ill-appearing, toxic-appearing or diaphoretic.  HENT:     Head: Normocephalic.     Right Ear: External ear normal.     Left Ear: External ear normal.     Nose: Nose normal.     Mouth/Throat:     Mouth: Mucous membranes are moist.     Pharynx: Oropharynx is clear.  Eyes:     General:        Right eye: No discharge.        Left eye: No discharge.     Extraocular Movements: Extraocular movements intact.     Conjunctiva/sclera: Conjunctivae normal.     Pupils: Pupils are  equal, round, and reactive to light.  Cardiovascular:     Rate and Rhythm: Normal rate and regular rhythm.     Heart sounds: No murmur heard. Pulmonary:     Effort: Pulmonary effort is normal. No respiratory distress.     Breath sounds: Normal breath sounds. No wheezing or rales.  Musculoskeletal:     Cervical back: Normal range of motion and neck supple.  Skin:    General: Skin is warm and dry.     Capillary Refill: Capillary refill takes less than 2 seconds.  Neurological:     General: No focal deficit present.     Mental Status: She is alert and oriented to person, place, and time. Mental status is at baseline.  Psychiatric:        Mood and Affect: Mood normal.        Behavior: Behavior normal.        Thought Content: Thought content normal.        Judgment: Judgment normal.     Results for  orders placed or performed in visit on 11/14/21  GC/Chlamydia Probe Amp   Specimen: Urine   UR  Result Value Ref Range   Chlamydia trachomatis, NAA Negative Negative   Neisseria Gonorrhoeae by PCR Negative Negative  CBC with Differential/Platelet  Result Value Ref Range   WBC 4.6 3.4 - 10.8 x10E3/uL   RBC 4.67 3.77 - 5.28 x10E6/uL   Hemoglobin 14.7 11.1 - 15.9 g/dL   Hematocrit 42.3 34.0 - 46.6 %   MCV 91 79 - 97 fL   MCH 31.5 26.6 - 33.0 pg   MCHC 34.8 31.5 - 35.7 g/dL   RDW 13.0 11.7 - 15.4 %   Platelets 286 150 - 450 x10E3/uL   Neutrophils 45 Not Estab. %   Lymphs 46 Not Estab. %   Monocytes 7 Not Estab. %   Eos 1 Not Estab. %   Basos 1 Not Estab. %   Neutrophils Absolute 2.1 1.4 - 7.0 x10E3/uL   Lymphocytes Absolute 2.1 0.7 - 3.1 x10E3/uL   Monocytes Absolute 0.3 0.1 - 0.9 x10E3/uL   EOS (ABSOLUTE) 0.1 0.0 - 0.4 x10E3/uL   Basophils Absolute 0.0 0.0 - 0.2 x10E3/uL   Immature Granulocytes 0 Not Estab. %   Immature Grans (Abs) 0.0 0.0 - 0.1 x10E3/uL  Comprehensive metabolic panel  Result Value Ref Range   Glucose 77 70 - 99 mg/dL   BUN 9 6 - 20 mg/dL   Creatinine, Ser  0.82 0.57 - 1.00 mg/dL   eGFR 96 >59 mL/min/1.73   BUN/Creatinine Ratio 11 9 - 23   Sodium 144 134 - 144 mmol/L   Potassium 4.3 3.5 - 5.2 mmol/L   Chloride 107 (H) 96 - 106 mmol/L   CO2 23 20 - 29 mmol/L   Calcium 8.9 8.7 - 10.2 mg/dL   Total Protein 6.3 6.0 - 8.5 g/dL   Albumin 4.1 3.8 - 4.8 g/dL   Globulin, Total 2.2 1.5 - 4.5 g/dL   Albumin/Globulin Ratio 1.9 1.2 - 2.2   Bilirubin Total 0.3 0.0 - 1.2 mg/dL   Alkaline Phosphatase 70 44 - 121 IU/L   AST 14 0 - 40 IU/L   ALT 15 0 - 32 IU/L  Lipid Panel w/o Chol/HDL Ratio  Result Value Ref Range   Cholesterol, Total 201 (H) 100 - 199 mg/dL   Triglycerides 34 0 - 149 mg/dL   HDL 63 >39 mg/dL   VLDL Cholesterol Cal 6 5 - 40 mg/dL   LDL Chol Calc (NIH) 132 (H) 0 - 99 mg/dL  TSH  Result Value Ref Range   TSH 1.060 0.450 - 4.500 uIU/mL  HIV Antibody (routine testing w rflx)  Result Value Ref Range   HIV Screen 4th Generation wRfx Non Reactive Non Reactive  RPR  Result Value Ref Range   RPR Ser Ql Non Reactive Non Reactive  HSV(herpes simplex vrs) 1+2 ab-IgG  Result Value Ref Range   HSV 1 Glycoprotein G Ab, IgG <0.91 0.00 - 0.90 index   HSV 2 IgG, Type Spec <0.91 0.00 - 0.90 index      Assessment & Plan:   Problem List Items Addressed This Visit       Other   Obesity - Primary    Chronic. Working on weight loss.  Patient has lost 7lbs on Wegovy 0.10m.  Will increase dose to .562mof Wegovy.  Educated patient on meals and side effects.  Follow up in 1 month will plan to increase to 63m60mf patient tolerates the 0.5mg563m  well.  Call sooner if concerns arise.       Relevant Medications   Semaglutide-Weight Management (WEGOVY) 0.25 MG/0.5ML SOAJ     Follow up plan: Return in about 1 month (around 02/26/2022) for Weight Managment.

## 2022-01-26 NOTE — Assessment & Plan Note (Signed)
Chronic. Working on weight loss.  Patient has lost 7lbs on Wegovy 0.'25mg'$ .  Will increase dose to .'5mg'$  of Wegovy.  Educated patient on meals and side effects.  Follow up in 1 month will plan to increase to '1mg'$  if patient tolerates the 0.'5mg'$  well.  Call sooner if concerns arise.

## 2022-01-30 ENCOUNTER — Ambulatory Visit: Payer: No Typology Code available for payment source | Admitting: Nurse Practitioner

## 2022-02-19 NOTE — Progress Notes (Signed)
BP 130/88   Pulse 66   Temp 97.8 F (36.6 C) (Oral)   Wt 196 lb 9.6 oz (89.2 kg)   LMP 10/23/2015   SpO2 99%   BMI 34.83 kg/m    Subjective:    Patient ID: [name], female    DOB: [birth year], 36 y.o.   MRN: [id number]  HPI: FONTELLA SHAN is a 36 y.o. female  Chief Complaint  Patient presents with   Weight Check   WEIGHT LOSS: Patient states it is going good.  She is still having fatigue for a couple of days.  No new side effects.    Has lost about 12lbs since May when she started the medication.  Denies concerns at visit today.    Relevant past medical, surgical, family and social history reviewed and updated as indicated. Interim medical history since our last visit reviewed. Allergies and medications reviewed and updated.  Review of Systems  Constitutional:  Positive for fatigue. Negative for unexpected weight change.  Gastrointestinal:  Positive for abdominal pain.    Per HPI unless specifically indicated above     Objective:    BP 130/88   Pulse 66   Temp 97.8 F (36.6 C) (Oral)   Wt 196 lb 9.6 oz (89.2 kg)   LMP 10/23/2015   SpO2 99%   BMI 34.83 kg/m   Wt Readings from Last 3 Encounters:  02/20/22 196 lb 9.6 oz (89.2 kg)  01/26/22 201 lb 6.4 oz (91.4 kg)  11/14/21 208 lb 6.4 oz (94.5 kg)    Physical Exam Vitals and nursing note reviewed.  Constitutional:      General: She is not in acute distress.    Appearance: Normal appearance. She is obese. She is not ill-appearing, toxic-appearing or diaphoretic.  HENT:     Head: Normocephalic.     Right Ear: External ear normal.     Left Ear: External ear normal.     Nose: Nose normal.     Mouth/Throat:     Mouth: Mucous membranes are moist.     Pharynx: Oropharynx is clear.  Eyes:     General:        Right eye: No discharge.        Left eye: No discharge.     Extraocular Movements: Extraocular movements intact.     Conjunctiva/sclera: Conjunctivae normal.     Pupils: Pupils are equal,  round, and reactive to light.  Cardiovascular:     Rate and Rhythm: Normal rate and regular rhythm.     Heart sounds: No murmur heard. Pulmonary:     Effort: Pulmonary effort is normal. No respiratory distress.     Breath sounds: Normal breath sounds. No wheezing or rales.  Musculoskeletal:     Cervical back: Normal range of motion and neck supple.  Skin:    General: Skin is warm and dry.     Capillary Refill: Capillary refill takes less than 2 seconds.  Neurological:     General: No focal deficit present.     Mental Status: She is alert and oriented to person, place, and time. Mental status is at baseline.  Psychiatric:        Mood and Affect: Mood normal.        Behavior: Behavior normal.        Thought Content: Thought content normal.        Judgment: Judgment normal.     Results for orders placed or performed in visit on 11/14/21  GC/Chlamydia Probe Amp   Specimen: Urine   UR  Result Value Ref Range   Chlamydia trachomatis, NAA Negative Negative   Neisseria Gonorrhoeae by PCR Negative Negative  CBC with Differential/Platelet  Result Value Ref Range   WBC 4.6 3.4 - 10.8 x10E3/uL   RBC 4.67 3.77 - 5.28 x10E6/uL   Hemoglobin 14.7 11.1 - 15.9 g/dL   Hematocrit 42.3 34.0 - 46.6 %   MCV 91 79 - 97 fL   MCH 31.5 26.6 - 33.0 pg   MCHC 34.8 31.5 - 35.7 g/dL   RDW 13.0 11.7 - 15.4 %   Platelets 286 150 - 450 x10E3/uL   Neutrophils 45 Not Estab. %   Lymphs 46 Not Estab. %   Monocytes 7 Not Estab. %   Eos 1 Not Estab. %   Basos 1 Not Estab. %   Neutrophils Absolute 2.1 1.4 - 7.0 x10E3/uL   Lymphocytes Absolute 2.1 0.7 - 3.1 x10E3/uL   Monocytes Absolute 0.3 0.1 - 0.9 x10E3/uL   EOS (ABSOLUTE) 0.1 0.0 - 0.4 x10E3/uL   Basophils Absolute 0.0 0.0 - 0.2 x10E3/uL   Immature Granulocytes 0 Not Estab. %   Immature Grans (Abs) 0.0 0.0 - 0.1 x10E3/uL  Comprehensive metabolic panel  Result Value Ref Range   Glucose 77 70 - 99 mg/dL   BUN 9 6 - 20 mg/dL   Creatinine, Ser 0.82  0.57 - 1.00 mg/dL   eGFR 96 >59 mL/min/1.73   BUN/Creatinine Ratio 11 9 - 23   Sodium 144 134 - 144 mmol/L   Potassium 4.3 3.5 - 5.2 mmol/L   Chloride 107 (H) 96 - 106 mmol/L   CO2 23 20 - 29 mmol/L   Calcium 8.9 8.7 - 10.2 mg/dL   Total Protein 6.3 6.0 - 8.5 g/dL   Albumin 4.1 3.8 - 4.8 g/dL   Globulin, Total 2.2 1.5 - 4.5 g/dL   Albumin/Globulin Ratio 1.9 1.2 - 2.2   Bilirubin Total 0.3 0.0 - 1.2 mg/dL   Alkaline Phosphatase 70 44 - 121 IU/L   AST 14 0 - 40 IU/L   ALT 15 0 - 32 IU/L  Lipid Panel w/o Chol/HDL Ratio  Result Value Ref Range   Cholesterol, Total 201 (H) 100 - 199 mg/dL   Triglycerides 34 0 - 149 mg/dL   HDL 63 >39 mg/dL   VLDL Cholesterol Cal 6 5 - 40 mg/dL   LDL Chol Calc (NIH) 132 (H) 0 - 99 mg/dL  TSH  Result Value Ref Range   TSH 1.060 0.450 - 4.500 uIU/mL  HIV Antibody (routine testing w rflx)  Result Value Ref Range   HIV Screen 4th Generation wRfx Non Reactive Non Reactive  RPR  Result Value Ref Range   RPR Ser Ql Non Reactive Non Reactive  HSV(herpes simplex vrs) 1+2 ab-IgG  Result Value Ref Range   HSV 1 Glycoprotein G Ab, IgG <0.91 0.00 - 0.90 index   HSV 2 IgG, Type Spec <0.91 0.00 - 0.90 index      Assessment & Plan:   Problem List Items Addressed This Visit       Other   Obesity - Primary    Chronic. Has lost about 12lbs since May when she started Wegovy.  Tolerating it well.  Will increase dose to 64m weekly.  Will increase to 1.712mwhen she has completed the 42m22m Continue with diet and exercise.  Follow up in 3 months.  Call sooner if concerns arise.  Relevant Medications   Semaglutide-Weight Management (WEGOVY) 1 MG/0.5ML SOAJ     Follow up plan: Return in about 3 months (around 05/23/2022) for Weight Managment.

## 2022-02-20 ENCOUNTER — Ambulatory Visit (INDEPENDENT_AMBULATORY_CARE_PROVIDER_SITE_OTHER): Payer: No Typology Code available for payment source | Admitting: Nurse Practitioner

## 2022-02-20 ENCOUNTER — Encounter: Payer: Self-pay | Admitting: Nurse Practitioner

## 2022-02-20 ENCOUNTER — Other Ambulatory Visit: Payer: Self-pay

## 2022-02-20 VITALS — BP 130/88 | HR 66 | Temp 97.8°F | Wt 196.6 lb

## 2022-02-20 DIAGNOSIS — E6609 Other obesity due to excess calories: Secondary | ICD-10-CM | POA: Diagnosis not present

## 2022-02-20 DIAGNOSIS — Z6839 Body mass index (BMI) 39.0-39.9, adult: Secondary | ICD-10-CM

## 2022-02-20 MED ORDER — WEGOVY 1 MG/0.5ML ~~LOC~~ SOAJ
1.0000 mg | SUBCUTANEOUS | 0 refills | Status: DC
Start: 2022-02-20 — End: 2022-03-19
  Filled 2022-02-20: qty 2, 28d supply, fill #0

## 2022-02-20 NOTE — Assessment & Plan Note (Signed)
Chronic. Has lost about 12lbs since May when she started Wegovy.  Tolerating it well.  Will increase dose to '1mg'$  weekly.  Will increase to 1.'7mg'$  when she has completed the '1mg'$ .  Continue with diet and exercise.  Follow up in 3 months.  Call sooner if concerns arise.

## 2022-02-26 ENCOUNTER — Ambulatory Visit: Payer: No Typology Code available for payment source | Admitting: Nurse Practitioner

## 2022-03-11 ENCOUNTER — Encounter: Payer: Self-pay | Admitting: Nurse Practitioner

## 2022-03-19 ENCOUNTER — Encounter: Payer: Self-pay | Admitting: Nurse Practitioner

## 2022-03-19 ENCOUNTER — Other Ambulatory Visit: Payer: Self-pay

## 2022-03-19 MED ORDER — WEGOVY 1.7 MG/0.75ML ~~LOC~~ SOAJ
1.7000 mg | SUBCUTANEOUS | 0 refills | Status: DC
Start: 2022-03-19 — End: 2022-04-20
  Filled 2022-03-19: qty 3, 28d supply, fill #0

## 2022-04-15 ENCOUNTER — Other Ambulatory Visit: Payer: Self-pay

## 2022-04-15 ENCOUNTER — Telehealth: Payer: No Typology Code available for payment source | Admitting: Emergency Medicine

## 2022-04-15 DIAGNOSIS — B9689 Other specified bacterial agents as the cause of diseases classified elsewhere: Secondary | ICD-10-CM | POA: Diagnosis not present

## 2022-04-15 DIAGNOSIS — N760 Acute vaginitis: Secondary | ICD-10-CM | POA: Diagnosis not present

## 2022-04-15 MED ORDER — METRONIDAZOLE 500 MG PO TABS
500.0000 mg | ORAL_TABLET | Freq: Two times a day (BID) | ORAL | 0 refills | Status: AC
  Filled 2022-04-15: qty 14, 7d supply, fill #0

## 2022-04-15 NOTE — Progress Notes (Signed)
E-Visit for Vaginal Symptoms  We are sorry that you are not feeling well. Here is how we plan to help! Based on what you shared with me it looks like you: May have a vaginosis due to bacteria  Vaginosis is an inflammation of the vagina that can result in discharge, itching and pain. The cause is usually a change in the normal balance of vaginal bacteria or an infection. Vaginosis can also result from reduced estrogen levels after menopause.  The most common causes of vaginosis are:   Bacterial vaginosis which results from an overgrowth of one on several organisms that are normally present in your vagina.   Yeast infections which are caused by a naturally occurring fungus called candida.   Vaginal atrophy (atrophic vaginosis) which results from the thinning of the vagina from reduced estrogen levels after menopause.   Trichomoniasis which is caused by a parasite and is commonly transmitted by sexual intercourse.  Factors that increase your risk of developing vaginosis include: Medications, such as antibiotics and steroids Uncontrolled diabetes Use of hygiene products such as bubble bath, vaginal spray or vaginal deodorant Douching Wearing damp or tight-fitting clothing Using an intrauterine device (IUD) for birth control Hormonal changes, such as those associated with pregnancy, birth control pills or menopause Sexual activity Having a sexually transmitted infection  Your treatment plan is Metronidazole or Flagyl 500mg twice a day for 7 days.  I have electronically sent this prescription into the pharmacy that you have chosen.  Be sure to take all of the medication as directed. Stop taking any medication if you develop a rash, tongue swelling or shortness of breath. Mothers who are breast feeding should consider pumping and discarding their breast milk while on these antibiotics. However, there is no consensus that infant exposure at these doses would be harmful.  Remember that  medication creams can weaken latex condoms. .   HOME CARE:  Good hygiene may prevent some types of vaginosis from recurring and may relieve some symptoms:  Avoid baths, hot tubs and whirlpool spas. Rinse soap from your outer genital area after a shower, and dry the area well to prevent irritation. Don't use scented or harsh soaps, such as those with deodorant or antibacterial action. Avoid irritants. These include scented tampons and pads. Wipe from front to back after using the toilet. Doing so avoids spreading fecal bacteria to your vagina.  Other things that may help prevent vaginosis include:  Don't douche. Your vagina doesn't require cleansing other than normal bathing. Repetitive douching disrupts the normal organisms that reside in the vagina and can actually increase your risk of vaginal infection. Douching won't clear up a vaginal infection. Use a latex condom. Both female and female latex condoms may help you avoid infections spread by sexual contact. Wear cotton underwear. Also wear pantyhose with a cotton crotch. If you feel comfortable without it, skip wearing underwear to bed. Yeast thrives in moist environments Your symptoms should improve in the next day or two.  GET HELP RIGHT AWAY IF:  You have pain in your lower abdomen ( pelvic area or over your ovaries) You develop nausea or vomiting You develop a fever Your discharge changes or worsens You have persistent pain with intercourse You develop shortness of breath, a rapid pulse, or you faint.  These symptoms could be signs of problems or infections that need to be evaluated by a medical provider now.  MAKE SURE YOU   Understand these instructions. Will watch your condition. Will get help right   away if you are not doing well or get worse.  Thank you for choosing an e-visit.  Your e-visit answers were reviewed by a board certified advanced clinical practitioner to complete your personal care plan. Depending upon the  condition, your plan could have included both over the counter or prescription medications.  Please review your pharmacy choice. Make sure the pharmacy is open so you can pick up prescription now. If there is a problem, you may contact your provider through [employer] and have the prescription routed to another pharmacy.  Your safety is important to [place]. If you have drug allergies check your prescription carefully.   For the next 24 hours you can use MyChart to ask questions about today's visit, request a non-urgent call back, or ask for a work or school excuse. You will get an email in the next two days asking about your experience. I hope that your e-visit has been valuable and will speed your recovery.  I have spent 5 minutes in review of e-visit questionnaire, review and updating patient chart, medical decision making and response to patient.   Willeen Cass, PhD, FNP-BC

## 2022-04-20 ENCOUNTER — Other Ambulatory Visit: Payer: Self-pay | Admitting: Nurse Practitioner

## 2022-04-21 ENCOUNTER — Other Ambulatory Visit: Payer: Self-pay

## 2022-04-21 MED ORDER — WEGOVY 1.7 MG/0.75ML ~~LOC~~ SOAJ
1.7000 mg | SUBCUTANEOUS | 0 refills | Status: DC
Start: 2022-04-21 — End: 2022-05-15
  Filled 2022-04-21: qty 3, 28d supply, fill #0

## 2022-04-21 NOTE — Telephone Encounter (Signed)
Requested medication (s) are due for refill today: yes  Requested medication (s) are on the active medication list: yes  Last refill:  03/19/22  Future visit scheduled: yes  Notes to clinic:  Unable to refill per protocol due to failed labs, no updated results. Updated A1c      Requested Prescriptions  Pending Prescriptions Disp Refills   Semaglutide-Weight Management (WEGOVY) 1.7 MG/0.75ML SOAJ 3 mL 0    Sig: Inject 1.7 mg into the skin once a week.     Endocrinology:  Diabetes - GLP-1 Receptor Agonists - semaglutide Failed - 04/20/2022 11:41 AM      Failed - HBA1C in normal range and within 180 days    Hgb A1c MFr Bld  Date Value Ref Range Status  11/05/2016 5.4 4.8 - 5.6 % Final    Comment:             Pre-diabetes: 5.7 - 6.4          Diabetes: >6.4          Glycemic control for adults with diabetes: <7.0          Passed - Cr in normal range and within 360 days    Creatinine  Date Value Ref Range Status  04/24/2013 0.88 0.60 - 1.30 mg/dL Final   Creatinine, Ser  Date Value Ref Range Status  11/14/2021 0.82 0.57 - 1.00 mg/dL Final         Passed - Valid encounter within last 6 months    Recent Outpatient Visits           2 months ago Class 2 obesity due to excess calories without serious comorbidity with body mass index (BMI) of 39.0 to 39.9 in adult   [place], Santiago Glad, NP   2 months ago Class 2 obesity due to excess calories without serious comorbidity with body mass index (BMI) of 39.0 to 39.9 in adult   [place], Santiago Glad, NP   5 months ago Mild episode of recurrent major depressive disorder ([place])   [place], Henrine Screws T, NP   1 year ago Acute non-recurrent maxillary sinusitis   [facility] Jon Billings, NP   1 year ago Insomnia, unspecified type   [facility] Jon Billings, NP       Future Appointments             In 4 weeks Jon Billings, NP  [facility], [place]

## 2022-05-15 ENCOUNTER — Other Ambulatory Visit: Payer: Self-pay

## 2022-05-15 ENCOUNTER — Telehealth: Payer: Self-pay | Admitting: Nurse Practitioner

## 2022-05-15 MED ORDER — WEGOVY 2.4 MG/0.75ML ~~LOC~~ SOAJ
2.4000 mg | SUBCUTANEOUS | 0 refills | Status: DC
Start: 2022-05-15 — End: 2022-06-10
  Filled 2022-05-15: qty 3, 28d supply, fill #0

## 2022-05-15 NOTE — Telephone Encounter (Signed)
Medication Refill - Medication: Semaglutide-Weight Management (WEGOVY)   Pt is requesting the next dose of 2.00 MG . Pt stated she was scheduled for 11/22 appt but was advised by the office she needed to reschedule. Pt rescheduled for the next available 06/11/2023 as MyChart visit due to provider availability.  Has the patient contacted their pharmacy? No. No, more refills.   (Agent: If no, request that the patient contact the pharmacy for the refill. If patient does not wish to contact the pharmacy document the reason why and proceed with request.)   Preferred Pharmacy (with phone number or street name):  [facility]  [place] [address]  Phone: [phone] Fax: [phone]  Hours: M-F 7:30a-5:30p   Has the patient been seen for an appointment in the last year OR does the patient have an upcoming appointment? Yes.    Agent: Please be advised that RX refills may take up to 3 business days. We ask that you follow-up with your pharmacy.

## 2022-05-19 ENCOUNTER — Ambulatory Visit: Payer: No Typology Code available for payment source | Admitting: Nurse Practitioner

## 2022-05-20 ENCOUNTER — Ambulatory Visit: Payer: No Typology Code available for payment source | Admitting: Nurse Practitioner

## 2022-05-29 ENCOUNTER — Ambulatory Visit: Payer: No Typology Code available for payment source | Admitting: Nurse Practitioner

## 2022-06-10 ENCOUNTER — Encounter: Payer: Self-pay | Admitting: Nurse Practitioner

## 2022-06-10 ENCOUNTER — Telehealth (INDEPENDENT_AMBULATORY_CARE_PROVIDER_SITE_OTHER): Payer: No Typology Code available for payment source | Admitting: Nurse Practitioner

## 2022-06-10 ENCOUNTER — Other Ambulatory Visit: Payer: Self-pay

## 2022-06-10 VITALS — BP 124/80 | Wt 184.6 lb

## 2022-06-10 DIAGNOSIS — E6609 Other obesity due to excess calories: Secondary | ICD-10-CM

## 2022-06-10 DIAGNOSIS — Z6839 Body mass index (BMI) 39.0-39.9, adult: Secondary | ICD-10-CM | POA: Diagnosis not present

## 2022-06-10 DIAGNOSIS — F330 Major depressive disorder, recurrent, mild: Secondary | ICD-10-CM | POA: Diagnosis not present

## 2022-06-10 MED ORDER — WEGOVY 2.4 MG/0.75ML ~~LOC~~ SOAJ
2.4000 mg | SUBCUTANEOUS | 1 refills | Status: DC
  Filled 2022-06-10: qty 3, 28d supply, fill #0
  Filled 2022-07-09 – 2022-07-20 (×5): qty 3, 28d supply, fill #1
  Filled 2022-08-14: qty 3, 28d supply, fill #2
  Filled 2022-09-08: qty 3, 28d supply, fill #3
  Filled 2022-09-30: qty 3, 28d supply, fill #4
  Filled 2022-10-21: qty 3, 28d supply, fill #5
  Filled 2022-10-24: qty 3, 28d supply, fill #0

## 2022-06-10 NOTE — Assessment & Plan Note (Signed)
Chronic. Exacerbated at this time.  Feels like it might be related to the holidays.  Will follow up in 6 weeks.  If still exacerbated at that time, may consider medication.  Call sooner if concerns arise.

## 2022-06-10 NOTE — Assessment & Plan Note (Signed)
Chronic. Has lost about 12lbs since our last visit.  Tolerating it well.  Continue with JSEGBT 2.'4mg'$  weekly.   Continue with diet and exercise.  Follow up in 3 months.  Call sooner if concerns arise.

## 2022-06-10 NOTE — Progress Notes (Signed)
BP 124/80   Wt 184 lb 9.6 oz (83.7 kg)   LMP 10/23/2015   BMI 32.70 kg/m    Subjective:    Patient ID: Laura [name], female    DOB: 1986-04-24, 36 y.o.   MRN: [id number]  HPI: Laura [name] is a 36 y.o. female  Chief Complaint  Patient presents with   Weight Loss   WEIGHT LOSS: Patient states it is going well.  She was really fatigued during the first two weeks.  She has adjusted to the dose.  She has lost 12lbs since out last visit.    DEPRESSION/ANXIETY Patient states her depression has been okay.  States November is usually hard for her but she feels like she is doing okay.  She did have some crying episodes. Feels like she is worrying a lot and her anxiety has worsened.  She has been considering getting back on medication.  Denies SI.    Relevant past medical, surgical, family and social history reviewed and updated as indicated. Interim medical history since our last visit reviewed. Allergies and medications reviewed and updated.  Review of Systems  Psychiatric/Behavioral:  Positive for dysphoric mood. Negative for suicidal ideas. The patient is nervous/anxious.     Per HPI unless specifically indicated above     Objective:    BP 124/80   Wt 184 lb 9.6 oz (83.7 kg)   LMP 10/23/2015   BMI 32.70 kg/m   Wt Readings from Last 3 Encounters:  06/10/22 184 lb 9.6 oz (83.7 kg)  02/20/22 196 lb 9.6 oz (89.2 kg)  01/26/22 201 lb 6.4 oz (91.4 kg)    Physical Exam Vitals and nursing note reviewed.  HENT:     Head: Normocephalic.     Right Ear: Hearing normal.     Left Ear: Hearing normal.     Nose: Nose normal.  Eyes:     Pupils: Pupils are equal, round, and reactive to light.  Pulmonary:     Effort: Pulmonary effort is normal. No respiratory distress.  Neurological:     Mental Status: She is alert.  Psychiatric:        Mood and Affect: Mood normal.        Behavior: Behavior normal.        Thought Content: Thought content normal.        Judgment:  Judgment normal.     Results for orders placed or performed in visit on 11/14/21  GC/Chlamydia Probe Amp   Specimen: Urine   UR  Result Value Ref Range   Chlamydia trachomatis, NAA Negative Negative   Neisseria Gonorrhoeae by PCR Negative Negative  CBC with Differential/Platelet  Result Value Ref Range   WBC 4.6 3.4 - 10.8 x10E3/uL   RBC 4.67 3.77 - 5.28 x10E6/uL   Hemoglobin 14.7 11.1 - 15.9 g/dL   Hematocrit 42.3 34.0 - 46.6 %   MCV 91 79 - 97 fL   MCH 31.5 26.6 - 33.0 pg   MCHC 34.8 31.5 - 35.7 g/dL   RDW 13.0 11.7 - 15.4 %   Platelets 286 150 - 450 x10E3/uL   Neutrophils 45 Not Estab. %   Lymphs 46 Not Estab. %   Monocytes 7 Not Estab. %   Eos 1 Not Estab. %   Basos 1 Not Estab. %   Neutrophils Absolute 2.1 1.4 - 7.0 x10E3/uL   Lymphocytes Absolute 2.1 0.7 - 3.1 x10E3/uL   Monocytes Absolute 0.3 0.1 - 0.9 x10E3/uL   EOS (ABSOLUTE) 0.1  0.0 - 0.4 x10E3/uL   Basophils Absolute 0.0 0.0 - 0.2 x10E3/uL   Immature Granulocytes 0 Not Estab. %   Immature Grans (Abs) 0.0 0.0 - 0.1 x10E3/uL  Comprehensive metabolic panel  Result Value Ref Range   Glucose 77 70 - 99 mg/dL   BUN 9 6 - 20 mg/dL   Creatinine, Ser 0.82 0.57 - 1.00 mg/dL   eGFR 96 >59 mL/min/1.73   BUN/Creatinine Ratio 11 9 - 23   Sodium 144 134 - 144 mmol/L   Potassium 4.3 3.5 - 5.2 mmol/L   Chloride 107 (H) 96 - 106 mmol/L   CO2 23 20 - 29 mmol/L   Calcium 8.9 8.7 - 10.2 mg/dL   Total Protein 6.3 6.0 - 8.5 g/dL   Albumin 4.1 3.8 - 4.8 g/dL   Globulin, Total 2.2 1.5 - 4.5 g/dL   Albumin/Globulin Ratio 1.9 1.2 - 2.2   Bilirubin Total 0.3 0.0 - 1.2 mg/dL   Alkaline Phosphatase 70 44 - 121 IU/L   AST 14 0 - 40 IU/L   ALT 15 0 - 32 IU/L  Lipid Panel w/o Chol/HDL Ratio  Result Value Ref Range   Cholesterol, Total 201 (H) 100 - 199 mg/dL   Triglycerides 34 0 - 149 mg/dL   HDL 63 >39 mg/dL   VLDL Cholesterol Cal 6 5 - 40 mg/dL   LDL Chol Calc (NIH) 132 (H) 0 - 99 mg/dL  TSH  Result Value Ref Range   TSH  1.060 0.450 - 4.500 uIU/mL  HIV Antibody (routine testing w rflx)  Result Value Ref Range   HIV Screen 4th Generation wRfx Non Reactive Non Reactive  RPR  Result Value Ref Range   RPR Ser Ql Non Reactive Non Reactive  HSV(herpes simplex vrs) 1+2 ab-IgG  Result Value Ref Range   HSV 1 Glycoprotein G Ab, IgG <0.91 0.00 - 0.90 index   HSV 2 IgG, Type Spec <0.91 0.00 - 0.90 index      Assessment & Plan:   Problem List Items Addressed This Visit       Other   Depression - Primary    Chronic. Exacerbated at this time.  Feels like it might be related to the holidays.  Will follow up in 6 weeks.  If still exacerbated at that time, may consider medication.  Call sooner if concerns arise.       Obesity    Chronic. Has lost about 12lbs since our last visit.  Tolerating it well.  Continue with Wegovy 2.28m weekly.   Continue with diet and exercise.  Follow up in 3 months.  Call sooner if concerns arise.      Relevant Medications   Semaglutide-Weight Management (WEGOVY) 2.4 MG/0.75ML SOAJ     Follow up plan: Return in about 6 weeks (around 07/22/2022) for Depression/Anxiety FU (virtual).   This visit was completed via MyChart due to the restrictions of the COVID-19 pandemic. All issues as above were discussed and addressed. Physical exam was done as above through visual confirmation on MyChart. If it was felt that the patient should be evaluated in the office, they were directed there. The patient verbally consented to this visit. Location of the patient: Home Location of the provider: Office Those involved with this call:  Provider: KJon Billings NP CMA: BYvonna Alanis CMA Front Desk/Registration: ILynnell CatalanThis encounter was conducted via video.  I spent 20 dedicated to the care of this patient on the date of this encounter to include previsit review  of weight loss, medications, plan of care and follow up, face to face time with the patient, and post visit ordering of testing.

## 2022-06-11 NOTE — Progress Notes (Signed)
LVM asking patient to call back to schedule an appointment 

## 2022-06-23 ENCOUNTER — Encounter: Payer: Self-pay | Admitting: Nurse Practitioner

## 2022-06-24 ENCOUNTER — Telehealth: Payer: No Typology Code available for payment source | Admitting: Nurse Practitioner

## 2022-06-25 ENCOUNTER — Telehealth: Payer: No Typology Code available for payment source | Admitting: Nurse Practitioner

## 2022-07-09 ENCOUNTER — Other Ambulatory Visit: Payer: Self-pay

## 2022-07-10 ENCOUNTER — Other Ambulatory Visit: Payer: Self-pay

## 2022-07-11 ENCOUNTER — Other Ambulatory Visit: Payer: Self-pay

## 2022-07-15 ENCOUNTER — Other Ambulatory Visit (HOSPITAL_BASED_OUTPATIENT_CLINIC_OR_DEPARTMENT_OTHER): Payer: Self-pay

## 2022-07-15 ENCOUNTER — Other Ambulatory Visit: Payer: Self-pay

## 2022-07-16 ENCOUNTER — Other Ambulatory Visit: Payer: Self-pay

## 2022-07-16 ENCOUNTER — Encounter: Payer: Self-pay | Admitting: Nurse Practitioner

## 2022-07-17 ENCOUNTER — Other Ambulatory Visit: Payer: Self-pay

## 2022-07-20 ENCOUNTER — Other Ambulatory Visit: Payer: Self-pay

## 2022-07-28 ENCOUNTER — Ambulatory Visit: Payer: 59 | Admitting: Obstetrics & Gynecology

## 2022-07-28 ENCOUNTER — Encounter: Payer: Self-pay | Admitting: Obstetrics & Gynecology

## 2022-07-28 VITALS — BP 110/70 | Wt 180.0 lb

## 2022-07-28 DIAGNOSIS — E7800 Pure hypercholesterolemia, unspecified: Secondary | ICD-10-CM

## 2022-07-28 DIAGNOSIS — R6882 Decreased libido: Secondary | ICD-10-CM

## 2022-07-28 DIAGNOSIS — Z8639 Personal history of other endocrine, nutritional and metabolic disease: Secondary | ICD-10-CM

## 2022-07-28 DIAGNOSIS — R61 Generalized hyperhidrosis: Secondary | ICD-10-CM | POA: Diagnosis not present

## 2022-07-28 DIAGNOSIS — R232 Flushing: Secondary | ICD-10-CM

## 2022-07-28 DIAGNOSIS — N898 Other specified noninflammatory disorders of vagina: Secondary | ICD-10-CM

## 2022-07-28 NOTE — Progress Notes (Signed)
   Established Patient Office Visit  Subjective   Patient ID: MIRA BALON, female    DOB: 07/13/1985  Age: 37 y.o. MRN: [id number]  Chief Complaint  Patient presents with   Consult    Vaginal dryness     HPI   37 yo married P3 here with the issue of vaginal dryness, night sweats, hot flashes, decreased libido.  These symptoms started a few months after her hysterectomy/bilateral salpingectomy. They have worsened over the years. She is taking [place] and sexual dysfuction is listed as a possible side effect.  She has tried several types of lubricants with no good relief. Her mom had a hysterectomy at age 86 and so she is not sure when she went through menopause.  Objective:     BP 110/70   Wt 180 lb (81.6 kg)   LMP 10/23/2015   BMI 31.89 kg/m    Physical Exam   Well nourished, well hydrated Black female, no apparent distress She is ambulating and conversing normally. EG- minimal atrophy, no lesions  Assessment & Plan:  Vaginal dryness and dyspareunia- check FSH Menopausal symptoms - check FSH Decreased libido- possibly related to [facility], will check free testosterone  If labs normal, consider Vyleesi  H/o Vit d deficiency- check level Elevated LDL - check level  Problem List Items Addressed This Visit   None   No follow-ups on file.    Emily Filbert, MD

## 2022-08-06 LAB — TESTOSTERONE, FREE, TOTAL, SHBG
Sex Hormone Binding: 133 nmol/L — ABNORMAL HIGH (ref 24.6–122.0)
Testosterone, Free: 1.3 pg/mL (ref 0.0–4.2)
Testosterone: 31 ng/dL (ref 8–60)

## 2022-08-06 LAB — VITAMIN D 25 HYDROXY (VIT D DEFICIENCY, FRACTURES): Vit D, 25-Hydroxy: 29.3 ng/mL — ABNORMAL LOW (ref 30.0–100.0)

## 2022-08-06 LAB — LIPID PANEL
Chol/HDL Ratio: 2.8 ratio (ref 0.0–4.4)
Cholesterol, Total: 167 mg/dL (ref 100–199)
HDL: 60 mg/dL (ref >39)
LDL Chol Calc (NIH): 98 mg/dL (ref 0–99)
Triglycerides: 44 mg/dL (ref 0–149)
VLDL Cholesterol Cal: 9 mg/dL (ref 5–40)

## 2022-08-06 LAB — FOLLICLE STIMULATING HORMONE: FSH: 6.1 m[IU]/mL

## 2022-08-10 ENCOUNTER — Other Ambulatory Visit: Payer: Self-pay

## 2022-08-10 ENCOUNTER — Other Ambulatory Visit: Payer: Self-pay | Admitting: Obstetrics & Gynecology

## 2022-08-10 DIAGNOSIS — R7989 Other specified abnormal findings of blood chemistry: Secondary | ICD-10-CM

## 2022-08-10 MED ORDER — VITAMIN D (ERGOCALCIFEROL) 1.25 MG (50000 UNIT) PO CAPS
50000.0000 [IU] | ORAL_CAPSULE | ORAL | 0 refills | Status: DC
  Filled 2022-08-10: qty 8, 56d supply, fill #0

## 2022-08-10 MED ORDER — TESTOSTERONE 1.62 % TD GEL
TRANSDERMAL | 1 refills | Status: DC
  Filled 2022-08-10: qty 75, 30d supply, fill #0

## 2022-08-10 NOTE — Progress Notes (Signed)
Vit d and test gel (for decreased libido) prescribed.

## 2022-08-11 ENCOUNTER — Other Ambulatory Visit: Payer: Self-pay

## 2022-08-14 ENCOUNTER — Other Ambulatory Visit: Payer: Self-pay

## 2022-08-18 ENCOUNTER — Other Ambulatory Visit: Payer: Self-pay

## 2022-09-30 ENCOUNTER — Other Ambulatory Visit: Payer: Self-pay

## 2022-10-21 ENCOUNTER — Other Ambulatory Visit (HOSPITAL_COMMUNITY): Payer: Self-pay

## 2022-10-21 ENCOUNTER — Other Ambulatory Visit: Payer: Self-pay

## 2022-10-24 ENCOUNTER — Other Ambulatory Visit (HOSPITAL_COMMUNITY): Payer: Self-pay

## 2022-10-26 ENCOUNTER — Other Ambulatory Visit (HOSPITAL_COMMUNITY): Payer: Self-pay

## 2022-11-03 ENCOUNTER — Other Ambulatory Visit (HOSPITAL_COMMUNITY): Payer: Self-pay

## 2022-11-20 ENCOUNTER — Ambulatory Visit: Payer: 59 | Admitting: Nurse Practitioner

## 2022-12-03 NOTE — Progress Notes (Deleted)
LMP 10/23/2015    Subjective:    Patient ID: Laura [name], female    DOB: 03/03/1986, 37 y.o.   MRN: [id number]  HPI: Laura [name] is a 37 y.o. female  No chief complaint on file.  WEIGHT LOSS: Patient states it is going good.  She is still having fatigue for a couple of days.  No new side effects.    Has lost about 12lbs since May when she started the medication.  Denies concerns at visit today.    Relevant past medical, surgical, family and social history reviewed and updated as indicated. Interim medical history since our last visit reviewed. Allergies and medications reviewed and updated.  Review of Systems  Constitutional:  Positive for fatigue. Negative for unexpected weight change.  Gastrointestinal:  Positive for abdominal pain.    Per HPI unless specifically indicated above     Objective:    LMP 10/23/2015   Wt Readings from Last 3 Encounters:  07/28/22 180 lb (81.6 kg)  06/10/22 184 lb 9.6 oz (83.7 kg)  02/20/22 196 lb 9.6 oz (89.2 kg)    Physical Exam Vitals and nursing note reviewed.  Constitutional:      General: She is not in acute distress.    Appearance: Normal appearance. She is obese. She is not ill-appearing, toxic-appearing or diaphoretic.  HENT:     Head: Normocephalic.     Right Ear: External ear normal.     Left Ear: External ear normal.     Nose: Nose normal.     Mouth/Throat:     Mouth: Mucous membranes are moist.     Pharynx: Oropharynx is clear.  Eyes:     General:        Right eye: No discharge.        Left eye: No discharge.     Extraocular Movements: Extraocular movements intact.     Conjunctiva/sclera: Conjunctivae normal.     Pupils: Pupils are equal, round, and reactive to light.  Cardiovascular:     Rate and Rhythm: Normal rate and regular rhythm.     Heart sounds: No murmur heard. Pulmonary:     Effort: Pulmonary effort is normal. No respiratory distress.     Breath sounds: Normal breath sounds. No wheezing or  rales.  Musculoskeletal:     Cervical back: Normal range of motion and neck supple.  Skin:    General: Skin is warm and dry.     Capillary Refill: Capillary refill takes less than 2 seconds.  Neurological:     General: No focal deficit present.     Mental Status: She is alert and oriented to person, place, and time. Mental status is at baseline.  Psychiatric:        Mood and Affect: Mood normal.        Behavior: Behavior normal.        Thought Content: Thought content normal.        Judgment: Judgment normal.    Results for orders placed or performed in visit on 07/28/22  [facility]  Result Value Ref Range   FSH 6.1 mIU/mL  Vitamin D (25 hydroxy)  Result Value Ref Range   Vit D, 25-Hydroxy 29.3 (L) 30.0 - 100.0 ng/mL  Testosterone, Free, Total, SHBG  Result Value Ref Range   Testosterone 31 8 - 60 ng/dL   Testosterone, Free 1.3 0.0 - 4.2 pg/mL   Sex Hormone Binding 133.0 (H) 24.6 - 122.0 nmol/L  Lipid panel  Result Value Ref  Range   Cholesterol, Total 167 100 - 199 mg/dL   Triglycerides 44 0 - 149 mg/dL   HDL 60 >29 mg/dL   VLDL Cholesterol Cal 9 5 - 40 mg/dL   LDL Chol Calc (NIH) 98 0 - 99 mg/dL   Chol/HDL Ratio 2.8 0.0 - 4.4 ratio      Assessment & Plan:   Problem List Items Addressed This Visit   None    Follow up plan: No follow-ups on file.

## 2022-12-04 ENCOUNTER — Ambulatory Visit: Payer: 59 | Admitting: Nurse Practitioner

## 2022-12-14 ENCOUNTER — Other Ambulatory Visit: Payer: Self-pay | Admitting: Obstetrics & Gynecology

## 2022-12-22 ENCOUNTER — Other Ambulatory Visit: Payer: Self-pay

## 2022-12-24 ENCOUNTER — Other Ambulatory Visit: Payer: Self-pay | Admitting: Obstetrics & Gynecology

## 2022-12-24 ENCOUNTER — Other Ambulatory Visit: Payer: Self-pay

## 2022-12-24 MED ORDER — VITAMIN D (ERGOCALCIFEROL) 1.25 MG (50000 UNIT) PO CAPS
50000.0000 [IU] | ORAL_CAPSULE | ORAL | 0 refills | Status: DC
  Filled 2022-12-24: qty 8, 56d supply, fill #0

## 2023-01-07 ENCOUNTER — Other Ambulatory Visit: Payer: Self-pay | Admitting: Oncology

## 2023-01-07 DIAGNOSIS — Z006 Encounter for examination for normal comparison and control in clinical research program: Secondary | ICD-10-CM

## 2023-02-08 ENCOUNTER — Other Ambulatory Visit
Admission: RE | Admit: 2023-02-08 | Discharge: 2023-02-08 | Disposition: A | Discharge status: Home / Self Care | Payer: 59 | Attending: Oncology | Admitting: Oncology

## 2023-02-08 DIAGNOSIS — Z006 Encounter for examination for normal comparison and control in clinical research program: Secondary | ICD-10-CM | POA: Insufficient documentation

## 2023-02-09 ENCOUNTER — Ambulatory Visit (INDEPENDENT_AMBULATORY_CARE_PROVIDER_SITE_OTHER): Payer: 59 | Admitting: Obstetrics & Gynecology

## 2023-02-09 ENCOUNTER — Encounter: Payer: Self-pay | Admitting: Obstetrics & Gynecology

## 2023-02-09 ENCOUNTER — Other Ambulatory Visit (HOSPITAL_COMMUNITY)
Admission: RE | Admit: 2023-02-09 | Discharge: 2023-02-09 | Disposition: A | Discharge status: Home / Self Care | Payer: 59 | Source: Ambulatory Visit | Attending: Obstetrics & Gynecology | Admitting: Obstetrics & Gynecology

## 2023-02-09 ENCOUNTER — Other Ambulatory Visit: Payer: Self-pay

## 2023-02-09 VITALS — BP 116/80 | HR 108 | Ht 61.5 in | Wt 182.8 lb

## 2023-02-09 DIAGNOSIS — Z0000 Encounter for general adult medical examination without abnormal findings: Secondary | ICD-10-CM

## 2023-02-09 DIAGNOSIS — Z124 Encounter for screening for malignant neoplasm of cervix: Secondary | ICD-10-CM

## 2023-02-09 DIAGNOSIS — F529 Unspecified sexual dysfunction not due to a substance or known physiological condition: Secondary | ICD-10-CM

## 2023-02-09 DIAGNOSIS — N898 Other specified noninflammatory disorders of vagina: Secondary | ICD-10-CM | POA: Insufficient documentation

## 2023-02-09 DIAGNOSIS — F419 Anxiety disorder, unspecified: Secondary | ICD-10-CM

## 2023-02-09 DIAGNOSIS — Z01419 Encounter for gynecological examination (general) (routine) without abnormal findings: Secondary | ICD-10-CM | POA: Diagnosis not present

## 2023-02-09 DIAGNOSIS — G4700 Insomnia, unspecified: Secondary | ICD-10-CM

## 2023-02-09 MED ORDER — ADDYI 100 MG PO TABS
1.0000 | ORAL_TABLET | Freq: Every evening | ORAL | 12 refills | Status: AC
Start: 2023-02-09 — End: ?
  Filled 2023-02-09 – 2023-03-10 (×4): qty 30, 30d supply, fill #0

## 2023-02-09 MED ORDER — ESCITALOPRAM OXALATE 20 MG PO TABS
20.0000 mg | ORAL_TABLET | Freq: Every day | ORAL | 5 refills | Status: DC
  Filled 2023-02-09: qty 90, 90d supply, fill #0

## 2023-02-09 NOTE — Progress Notes (Signed)
GYNECOLOGY ANNUAL PHYSICAL EXAM PROGRESS NOTE  Subjective:    Laura [name] is a 37 y.o. married G2P2003 (15, 80, and 14 yo kids) who presents for an annual exam. She still had decreased libido, as unable to get the testosterone cream due to insurance situation. She is still interested in treatment.  She also notes that it has become harder to achieve orgasm. The patient is sexually active. The patient participates in regular exercise: no. Has the patient ever been transfused or tattooed?: yes. The patient reports that there is not domestic violence in her life.   She has anxiety that is getting worse. She last saw her psychiatrist about 2 years. She has difficulty getting to sleep, not resting well for a long time.   She also reports an increase in vaginal discharge, no odor or itching.   Menstrual History:  Patient's last menstrual period was 10/23/2015.     Gynecologic History:  Contraception:  s/p hysterectomy   FH- unknown        OB History  Gravida Para Term Preterm AB Living  2 2 2  0 0 3  SAB IAB Ectopic Multiple Live Births  0 0 0 1 3    # Outcome Date GA Lbr Len/2nd Weight Sex Type Anes PTL Lv  2A Gravida 2010   4 lb 2.1 oz (1.873 kg) F CS-LTranv   LIV  2B Term 2010   4 lb 4.8 oz (1.95 kg) F CS-LTranv   LIV  1 Term 2009   7 lb 14.4 oz (3.583 kg) M Vag-Spont   LIV    Past Medical History:  Diagnosis Date   Anxiety    Bipolar disorder (HCC)    past hx of   Complication of anesthesia    "woke up during surgery"   Depression    Heavy periods    Incontinence of urine    Increased BMI    Indigestion    PTSD (post-traumatic stress disorder)    Tobacco user     Past Surgical History:  Procedure Laterality Date   CESAREAN SECTION  2010   LAPAROSCOPIC VAGINAL HYSTERECTOMY WITH SALPINGECTOMY Bilateral 12/16/2015   Procedure: LAPAROSCOPIC ASSISTED VAGINAL HYSTERECTOMY WITH BILATERAL SALPINGECTOMY;  Surgeon: Herold Harms, MD;  Location: [facility]  [facility];  Service: Gynecology;  Laterality: Bilateral;   TOTAL ABDOMINAL HYSTERECTOMY W/ BILATERAL SALPINGOOPHORECTOMY  2017   TUBAL LIGATION  2011    Family History  Problem Relation Age of Onset   Diabetes Mother    Mental illness Mother    Hyperlipidemia Mother    Hypertension Mother    Diabetes Maternal Grandmother    Heart disease Maternal Grandfather    Cancer Neg Hx     Social History   Socioeconomic History   Marital status: Married    Spouse name: Not on file   Number of children: Not on file   Years of education: Not on file   Highest education level: Not on file  Occupational History   Not on file  Tobacco Use   Smoking status: Every Day    Current packs/day: 0.25    Average packs/day: 0.3 packs/day for 11.0 years (2.8 ttl pk-yrs)    Types: Cigarettes   Smokeless tobacco: Never  Vaping Use   Vaping status: Never Used  Substance and Sexual Activity   Alcohol use: Yes    Alcohol/week: 0.0 - 6.0 standard drinks of alcohol    Comment: occasionally; beer   Drug use: No   Sexual  activity: Yes    Birth control/protection: Surgical  Other Topics Concern   Not on file  Social History Narrative   Not on file   Social Determinants of Health   Financial Resource Strain: Not on file  Food Insecurity: Not on file  Transportation Needs: Not on file  Physical Activity: Not on file  Stress: Not on file  Social Connections: Not on file  Intimate Partner Violence: Not on file    Current Outpatient Medications on File Prior to Visit  Medication Sig Dispense Refill   Semaglutide-Weight Management (WEGOVY) 2.4 MG/0.75ML SOAJ Inject 2.4 mg into the skin once a week. 9 mL 1   Testosterone 1.62 % GEL Apply small amount twice per week 75 g 1   Vitamin D, Ergocalciferol, (DRISDOL) 1.25 MG (50000 UNIT) CAPS capsule Take 1 capsule (50,000 Units total) by mouth every 7 (seven) days. 8 capsule 0   No current facility-administered medications on file prior to visit.     Allergies  Allergen Reactions   Amoxicillin Rash and Other (See Comments)    GI upset     Review of Systems Constitutional: negative for chills, fatigue, fevers and sweats Eyes: negative for irritation, redness and visual disturbance Ears, nose, mouth, throat, and face: negative for hearing loss, nasal congestion, snoring and tinnitus Respiratory: negative for asthma, cough, sputum Cardiovascular: negative for chest pain, dyspnea, exertional chest pressure/discomfort, irregular heart beat, palpitations and syncope Gastrointestinal: negative for abdominal pain, change in bowel habits, nausea and vomiting Genitourinary: negative for abnormal menstrual periods, genital lesions, sexual problems and vaginal discharge, dysuria and urinary incontinence Integument/breast: negative for breast lump, breast tenderness and nipple discharge Hematologic/lymphatic: negative for bleeding and easy bruising Musculoskeletal:negative for back pain and muscle weakness Neurological: negative for dizziness, headaches, vertigo and weakness Endocrine: negative for diabetic symptoms including polydipsia, polyuria and skin dryness Allergic/Immunologic: negative for hay fever and urticaria      Objective:  Height 5' 1.5" (1.562 m), weight 182 lb 12.8 oz (82.9 kg), last menstrual period 10/23/2015. Body mass index is 33.98 kg/m.    General Appearance:    Alert, cooperative, no distress, appears stated age  Head:    Normocephalic, without obvious abnormality, atraumatic  Eyes:    PERRL, conjunctiva/corneas clear, EOM's intact, both eyes  Ears:    Normal external ear canals, both ears  Nose:   Nares normal, septum midline, mucosa normal, no drainage or sinus tenderness  Throat:   Lips, mucosa, and tongue normal; teeth and gums normal  Neck:   Supple, symmetrical, trachea midline, no adenopathy; thyroid: no enlargement/tenderness/nodules; no carotid bruit or JVD  Back:     Symmetric, no curvature, ROM  normal, no CVA tenderness  Lungs:     Clear to auscultation bilaterally, respirations unlabored  Chest Wall:    No tenderness or deformity   Heart:    Regular rate and rhythm, S1 and S2 normal, no murmur, rub or gallop  Breast Exam:    No tenderness, masses, or nipple abnormality  Abdomen:     Soft, non-tender, bowel sounds active all four quadrants, no masses, no organomegaly.    Genitalia:    Pelvic:external genitalia normal, vagina without lesions, discharge, or tenderness, normal white vaginal discharge     Extremities:   Extremities normal, atraumatic, no cyanosis or edema  Pulses:   2+ and symmetric all extremities  Skin:   Skin color, texture, turgor normal, no rashes or lesions  Lymph nodes:   Cervical, supraclavicular, and axillary nodes normal  Neurologic:   CNII-XII intact, normal strength, sensation and reflexes throughout   .  Labs:  Lab Results  Component Value Date   WBC 4.6 11/14/2021   HGB 14.7 11/14/2021   HCT 42.3 11/14/2021   MCV 91 11/14/2021   PLT 286 11/14/2021    Lab Results  Component Value Date   CREATININE 0.82 11/14/2021   BUN 9 11/14/2021   NA 144 11/14/2021   K 4.3 11/14/2021   CL 107 (H) 11/14/2021   CO2 23 11/14/2021    Lab Results  Component Value Date   ALT 15 11/14/2021   AST 14 11/14/2021   ALKPHOS 70 11/14/2021   BILITOT 0.3 11/14/2021    Lab Results  Component Value Date   TSH 1.060 11/14/2021     Assessment:   No diagnosis found.   Plan:  Routine lab work ordered Start lexapro 20 mg at bedtime for insomnia and anxiety Psychiatry referral Start Addyi for help with orgasms  Follow up in 1 year for annual exam   Allie Bossier, MD [facility]

## 2023-02-22 ENCOUNTER — Other Ambulatory Visit: Payer: Self-pay

## 2023-02-23 ENCOUNTER — Other Ambulatory Visit: Payer: Self-pay

## 2023-02-23 LAB — HELIX MOLECULAR SCREEN

## 2023-02-26 ENCOUNTER — Telehealth: Payer: Self-pay

## 2023-02-26 NOTE — Telephone Encounter (Signed)
Laura [name] (Key: [id number]) - [id number] Addyi 100MG  tablets Status: Sent To PlanCreated: August 26th, 2024 [phone] Sent: August 26th, 2024

## 2023-03-08 ENCOUNTER — Other Ambulatory Visit: Payer: Self-pay

## 2023-03-09 ENCOUNTER — Other Ambulatory Visit: Payer: Self-pay | Admitting: Obstetrics & Gynecology

## 2023-03-09 ENCOUNTER — Other Ambulatory Visit: Payer: Self-pay

## 2023-03-09 ENCOUNTER — Telehealth: Payer: Self-pay

## 2023-03-09 MED ORDER — GEMTESA 75 MG PO TABS
75.0000 mg | ORAL_TABLET | Freq: Every day | ORAL | 4 refills | Status: AC
Start: 2023-03-09 — End: ?
  Filled 2023-03-09: qty 30, 30d supply, fill #0
  Filled 2023-03-10 – 2023-03-18 (×2): qty 40, 40d supply, fill #0

## 2023-03-09 MED ORDER — BUPROPION HCL ER (XL) 150 MG PO TB24
150.0000 mg | ORAL_TABLET | Freq: Every day | ORAL | 4 refills | Status: DC
  Filled 2023-03-09: qty 90, 90d supply, fill #0

## 2023-03-09 NOTE — Progress Notes (Signed)
[facility]em[facility] refilled per patient request and lexapro stopped and she will restart wellbutrin

## 2023-03-09 NOTE — Telephone Encounter (Signed)
Patient contacted office wanting to give update in regards to medication. Patient states that she ws prescribed Lexapro to help with sleep and states that she would like to discontinue medication do to mood changes and no improvement with sleep patterns. Patient states that she was previously on Wellbutrin in past to help with mood/insomnia and states that she had good symptom control on medication and would like to start medication again. Patient states that she was previously taking 150mg . Patient states that she uses [facility] pharmacy. Patient also states that she was previously given samples in office of Gemetsa for urinary incontinence and states she had significant improvement on medication,  she would like a prescription sent to pharmacy as well

## 2023-03-10 ENCOUNTER — Other Ambulatory Visit: Payer: Self-pay

## 2023-03-18 ENCOUNTER — Other Ambulatory Visit: Payer: Self-pay

## 2023-03-18 NOTE — Telephone Encounter (Signed)
Completed and faxed.

## 2023-03-31 ENCOUNTER — Encounter: Payer: Self-pay | Admitting: Certified Nurse Midwife

## 2023-03-31 ENCOUNTER — Ambulatory Visit: Payer: 59 | Admitting: Certified Nurse Midwife

## 2023-03-31 ENCOUNTER — Other Ambulatory Visit: Payer: Self-pay

## 2023-03-31 VITALS — BP 123/85 | HR 88 | Ht 62.0 in | Wt 187.0 lb

## 2023-03-31 DIAGNOSIS — Z7689 Persons encountering health services in other specified circumstances: Secondary | ICD-10-CM

## 2023-03-31 DIAGNOSIS — E669 Obesity, unspecified: Secondary | ICD-10-CM | POA: Diagnosis not present

## 2023-03-31 DIAGNOSIS — Z6834 Body mass index (BMI) 34.0-34.9, adult: Secondary | ICD-10-CM | POA: Diagnosis not present

## 2023-03-31 DIAGNOSIS — Z713 Dietary counseling and surveillance: Secondary | ICD-10-CM

## 2023-03-31 MED ORDER — CYANOCOBALAMIN 1000 MCG/ML IJ SOLN
1000.0000 ug | Freq: Once | INTRAMUSCULAR | 5 refills | Status: AC
  Filled 2023-03-31: qty 1, 30d supply, fill #0
  Filled 2023-11-29: qty 1, 30d supply, fill #1
  Filled 2023-12-27: qty 1, 30d supply, fill #2
  Filled 2024-03-29: qty 1, 30d supply, fill #3

## 2023-03-31 MED ORDER — CYANOCOBALAMIN 1000 MCG/ML IJ SOLN
1000.0000 ug | Freq: Once | INTRAMUSCULAR | Status: AC
Start: 2023-03-31 — End: 2023-03-31
  Administered 2023-03-31: 1000 ug via INTRAMUSCULAR

## 2023-03-31 MED ORDER — PHENTERMINE HCL 37.5 MG PO TABS
37.5000 mg | ORAL_TABLET | Freq: Every day | ORAL | 0 refills | Status: DC
  Filled 2023-03-31: qty 30, 30d supply, fill #0

## 2023-03-31 NOTE — Progress Notes (Signed)
Subjective:  Laura [name] is a 37 y.o. G2P2003 at Unknown being seen today for weight loss management- initial visit.  Patient reports General ROS: negative She started on wegovie  last year and lost several pounds. She had to stop due to insurance coverage of medication. She has gained a few pounds back and would like to get back on track.   Onset was gradual, due to stress and poor eating habits. Her goal weight is  160 lbs   Associated symptoms include: depression, fatigue change in clothing fit and menstrual changes. Previous/Current treatment includes: small frequent feedings, n  Pertinent medical history includes: none  Risk factors include: none.  The patient has a surgical history of:  none  Pertinent social history includes: none Past evaluation has included: comprehensive metabolic profile, thyroid panel  and lipid profile.  Labs today Hemoglobin A 1 c Past treatment has included: small frequent feedings, vitamin B-12 injections, appetite stimulant, exercise management and dietary changes.  The following portions of the patient's history were reviewed and updated as appropriate: allergies, current medications, past family history, past medical history, past social history, past surgical history and problem list.   Objective:   Vitals:   03/31/23 1013  BP: 123/85  Pulse: 88  Weight: 187 lb (84.8 kg)  Height: 5\' 2"  (1.575 m)  Waist " 39 in   General:  Alert, oriented and cooperative. Patient is in no acute distress.  PE: Well groomed female in no current distress,   Mental Status: Normal mood and affect. Normal behavior. Normal judgment and thought content.   Current BMI: Body mass index is 34.2 kg/m.   Assessment and Plan:  Obesity  1. Obesity (BMI 30-39.9) - Hemoglobin A1c  2. Encounter for weight management    Plan: low carb, High protein diet RX for adipex 37.5 mg daily and B12 .ml monthly, to start now with first injection given at today's visit.  Reviewed side-effects common to both medications and expected outcomes. Increase daily water intake to at least 8 bottle a day, every day.  Goal is to reduse weight by 5% by end of three months, and will re-evaluate then.  RTC in 4 weeks for Nurse visit to check weight & BP, and get next B12 injections.    Please refer to After Visit Summary for other counseling recommendations.    Doreene Burke, CNM   Melody Suzan Nailer, CNM      Consider the Low Glycemic Index Diet and 6 smaller meals daily .  This boosts your metabolism and regulates your sugars:   Use the protein bar by [employer] because they have lots of fiber in them  Find the low carb flatbreads, tortillas and pita breads for sandwiches:  Joseph's makes a pita bread and a flat bread , available at [facility] and [employer]; Toufayah makes a low carb flatbread available at [employer] and [employer] that is 9 net carbs and 100 cal [place] makes a low carb whole wheat tortilla available at [employer] most grocery stores with 6 net carbs and 210 cal  [place] yogurt can still have a lot of carbs .  Dannon Light N fit has 80 cal and 8 carbs

## 2023-03-31 NOTE — Patient Instructions (Signed)
Calorie Counting for Weight Loss Calories are units of energy. Your body needs a certain number of calories from food to keep going throughout the day. When you eat or drink more calories than your body needs, your body stores the extra calories mostly as fat. When you eat or drink fewer calories than your body needs, your body burns fat to get the energy it needs. Calorie counting means keeping track of how many calories you eat and drink each day. Calorie counting can be helpful if you need to lose weight. If you eat fewer calories than your body needs, you should lose weight. Ask your health care provider what a healthy weight is for you. For calorie counting to work, you will need to eat the right number of calories each day to lose a healthy amount of weight per week. A dietitian can help you figure out how many calories you need in a day and will suggest ways to reach your calorie goal. A healthy amount of weight to lose each week is usually 1-2 lb (0.5-0.9 kg). This usually means that your daily calorie intake should be reduced by 500-750 calories. Eating 1,200-1,500 calories a day can help most women lose weight. Eating 1,500-1,800 calories a day can help most men lose weight. What do I need to know about calorie counting? Work with your health care provider or dietitian to determine how many calories you should get each day. To meet your daily calorie goal, you will need to: Find out how many calories are in each food that you would like to eat. Try to do this before you eat. Decide how much of the food you plan to eat. Keep a food log. Do this by writing down what you ate and how many calories it had. To successfully lose weight, it is important to balance calorie counting with a healthy lifestyle that includes regular activity. Where do I find calorie information?  The number of calories in a food can be found on a Nutrition Facts label. If a food does not have a Nutrition Facts label, try  to look up the calories online or ask your dietitian for help. Remember that calories are listed per serving. If you choose to have more than one serving of a food, you will have to multiply the calories per serving by the number of servings you plan to eat. For example, the label on a package of bread might say that a serving size is 1 slice and that there are 90 calories in a serving. If you eat 1 slice, you will have eaten 90 calories. If you eat 2 slices, you will have eaten 180 calories. How do I keep a food log? After each time that you eat, record the following in your food log as soon as possible: What you ate. Be sure to include toppings, sauces, and other extras on the food. How much you ate. This can be measured in cups, ounces, or number of items. How many calories were in each food and drink. The total number of calories in the food you ate. Keep your food log near you, such as in a pocket-sized notebook or on an app or website on your mobile phone. Some programs will calculate calories for you and show you how many calories you have left to meet your daily goal. What are some portion-control tips? Know how many calories are in a serving. This will help you know how many servings you can have of a certain   food. Use a measuring cup to measure serving sizes. You could also try weighing out portions on a kitchen scale. With time, you will be able to estimate serving sizes for some foods. Take time to put servings of different foods on your favorite plates or in your favorite bowls and cups so you know what a serving looks like. Try not to eat straight from a food's packaging, such as from a bag or box. Eating straight from the package makes it hard to see how much you are eating and can lead to overeating. Put the amount you would like to eat in a cup or on a plate to make sure you are eating the right portion. Use smaller plates, glasses, and bowls for smaller portions and to prevent  overeating. Try not to multitask. For example, avoid watching TV or using your computer while eating. If it is time to eat, sit down at a table and enjoy your food. This will help you recognize when you are full. It will also help you be more mindful of what and how much you are eating. What are tips for following this plan? Reading food labels Check the calorie count compared with the serving size. The serving size may be smaller than what you are used to eating. Check the source of the calories. Try to choose foods that are high in protein, fiber, and vitamins, and low in saturated fat, trans fat, and sodium. Shopping Read nutrition labels while you shop. This will help you make healthy decisions about which foods to buy. Pay attention to nutrition labels for low-fat or fat-free foods. These foods sometimes have the same number of calories or more calories than the full-fat versions. They also often have added sugar, starch, or salt to make up for flavor that was removed with the fat. Make a grocery list of lower-calorie foods and stick to it. Cooking Try to cook your favorite foods in a healthier way. For example, try baking instead of frying. Use low-fat dairy products. Meal planning Use more fruits and vegetables. One-half of your plate should be fruits and vegetables. Include lean proteins, such as chicken, turkey, and fish. Lifestyle Each week, aim to do one of the following: 150 minutes of moderate exercise, such as walking. 75 minutes of vigorous exercise, such as running. General information Know how many calories are in the foods you eat most often. This will help you calculate calorie counts faster. Find a way of tracking calories that works for you. Get creative. Try different apps or programs if writing down calories does not work for you. What foods should I eat?  Eat nutritious foods. It is better to have a nutritious, high-calorie food, such as an avocado, than a food with  few nutrients, such as a bag of potato chips. Use your calories on foods and drinks that will fill you up and will not leave you hungry soon after eating. Examples of foods that fill you up are nuts and nut butters, vegetables, lean proteins, and high-fiber foods such as whole grains. High-fiber foods are foods with more than 5 g of fiber per serving. Pay attention to calories in drinks. Low-calorie drinks include water and unsweetened drinks. The items listed above may not be a complete list of foods and beverages you can eat. Contact a dietitian for more information. What foods should I limit? Limit foods or drinks that are not good sources of vitamins, minerals, or protein or that are high in unhealthy fats. These   include: Candy. Other sweets. Sodas, specialty coffee drinks, alcohol, and juice. The items listed above may not be a complete list of foods and beverages you should avoid. Contact a dietitian for more information. How do I count calories when eating out? Pay attention to portions. Often, portions are much larger when eating out. Try these tips to keep portions smaller: Consider sharing a meal instead of getting your own. If you get your own meal, eat only half of it. Before you start eating, ask for a container and put half of your meal into it. When available, consider ordering smaller portions from the menu instead of full portions. Pay attention to your food and drink choices. Knowing the way food is cooked and what is included with the meal can help you eat fewer calories. If calories are listed on the menu, choose the lower-calorie options. Choose dishes that include vegetables, fruits, whole grains, low-fat dairy products, and lean proteins. Choose items that are boiled, broiled, grilled, or steamed. Avoid items that are buttered, battered, fried, or served with cream sauce. Items labeled as crispy are usually fried, unless stated otherwise. Choose water, low-fat milk,  unsweetened iced tea, or other drinks without added sugar. If you want an alcoholic beverage, choose a lower-calorie option, such as a glass of wine or light beer. Ask for dressings, sauces, and syrups on the side. These are usually high in calories, so you should limit the amount you eat. If you want a salad, choose a garden salad and ask for grilled meats. Avoid extra toppings such as bacon, cheese, or fried items. Ask for the dressing on the side, or ask for olive oil and vinegar or lemon to use as dressing. Estimate how many servings of a food you are given. Knowing serving sizes will help you be aware of how much food you are eating at restaurants. Where to find more information Centers for Disease Control and Prevention: www.cdc.gov U.[place] Department of Agriculture: myplate.gov Summary Calorie counting means keeping track of how many calories you eat and drink each day. If you eat fewer calories than your body needs, you should lose weight. A healthy amount of weight to lose per week is usually 1-2 lb (0.5-0.9 kg). This usually means reducing your daily calorie intake by 500-750 calories. The number of calories in a food can be found on a Nutrition Facts label. If a food does not have a Nutrition Facts label, try to look up the calories online or ask your dietitian for help. Use smaller plates, glasses, and bowls for smaller portions and to prevent overeating. Use your calories on foods and drinks that will fill you up and not leave you hungry shortly after a meal. This information is not intended to replace advice given to you by your health care provider. Make sure you discuss any questions you have with your health care provider. Document Revised: 07/27/2019 Document Reviewed: 07/27/2019 Elsevier Patient Education  2023 Elsevier Inc.  

## 2023-05-13 ENCOUNTER — Other Ambulatory Visit: Payer: Self-pay

## 2023-06-01 ENCOUNTER — Other Ambulatory Visit (HOSPITAL_COMMUNITY)
Admission: RE | Admit: 2023-06-01 | Discharge: 2023-06-01 | Disposition: A | Discharge status: Home / Self Care | Payer: 59 | Source: Ambulatory Visit | Attending: Certified Nurse Midwife | Admitting: Certified Nurse Midwife

## 2023-06-01 ENCOUNTER — Ambulatory Visit (INDEPENDENT_AMBULATORY_CARE_PROVIDER_SITE_OTHER): Payer: 59

## 2023-06-01 VITALS — BP 114/68 | HR 94 | Ht 62.0 in | Wt 190.0 lb

## 2023-06-01 DIAGNOSIS — N898 Other specified noninflammatory disorders of vagina: Secondary | ICD-10-CM | POA: Insufficient documentation

## 2023-06-01 NOTE — Progress Notes (Signed)
    NURSE VISIT NOTE  Subjective:    Patient ID: ETHNE BENTS, female    DOB: 1985-06-30, 37 y.o.   MRN: [id number]  HPI  Patient is a 37 y.o. G61P2003 female who presents for  vaginal irritation for 3 day(s). Denies abnormal vaginal bleeding or significant pelvic pain or fever. denies genital irritation. Patient has history of known exposure to STD.   Objective:    LMP 10/23/2015    @THIS  VISIT ONLY@  Assessment:   1. Vaginal irritation      Plan:   GC and chlamydia DNA  probe sent to lab. Treatment: abstain from coitus during course of treatment ROV prn if symptoms persist or worsen.   Cornelius Moras, CMA

## 2023-06-02 LAB — CERVICOVAGINAL ANCILLARY ONLY
Bacterial Vaginitis (gardnerella): POSITIVE — AB
Candida Glabrata: NEGATIVE
Candida Vaginitis: NEGATIVE
Chlamydia: NEGATIVE
Comment: NEGATIVE
Comment: NEGATIVE
Comment: NEGATIVE
Comment: NEGATIVE
Comment: NEGATIVE
Comment: NORMAL
Neisseria Gonorrhea: NEGATIVE
Trichomonas: NEGATIVE

## 2023-08-05 ENCOUNTER — Other Ambulatory Visit: Payer: Self-pay

## 2023-08-05 ENCOUNTER — Encounter: Payer: Self-pay | Admitting: Emergency Medicine

## 2023-08-05 ENCOUNTER — Ambulatory Visit
Admission: EM | Admit: 2023-08-05 | Discharge: 2023-08-05 | Disposition: A | Discharge status: Home / Self Care | Payer: 59 | Attending: Emergency Medicine | Admitting: Emergency Medicine

## 2023-08-05 DIAGNOSIS — M25561 Pain in right knee: Secondary | ICD-10-CM | POA: Diagnosis not present

## 2023-08-05 DIAGNOSIS — M545 Low back pain, unspecified: Secondary | ICD-10-CM

## 2023-08-05 DIAGNOSIS — M546 Pain in thoracic spine: Secondary | ICD-10-CM | POA: Diagnosis not present

## 2023-08-05 DIAGNOSIS — M25571 Pain in right ankle and joints of right foot: Secondary | ICD-10-CM | POA: Diagnosis not present

## 2023-08-05 MED ORDER — PREDNISONE 20 MG PO TABS
40.0000 mg | ORAL_TABLET | Freq: Every day | ORAL | 0 refills | Status: DC
  Filled 2023-08-05: qty 10, 5d supply, fill #0

## 2023-08-05 MED ORDER — CYCLOBENZAPRINE HCL 10 MG PO TABS
10.0000 mg | ORAL_TABLET | Freq: Every day | ORAL | 0 refills | Status: DC
  Filled 2023-08-05: qty 10, 10d supply, fill #0
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
## 2023-08-05 NOTE — Discharge Instructions (Signed)
 Your pain is most likely caused by irritation to the muscles .,  This is typically occurring due to tension created at the time of impact  You may use ibuprofen  taking 600 to 800 mg every 6-8 hours for pain, may take Tylenol  or use any topical medicines additionally, use consistently for most effective results,   if ineffective he may begin use of prednisone  every morning with food as directed, stop use of ibuprofen  while taking this medicine  You may use muscle relaxer at bedtime to help combat stiffness to help you rest  You may use heating pad in 15 minute intervals as needed for additional comfort, within the first 2-3 days you may find comfort in using ice in 10-15 minutes over affected area  Begin massaging and stretching stretching affected area daily for 10 minutes as tolerated to further loosen muscles   When sitting and lying lying down place pillow underneath legs and behind back  If pain persist after recommended treatment or reoccurs if may be beneficial to follow up with orthopedic specialist for evaluation, this doctor specializes in the bones and can manage your symptoms long-term with options such as but not limited to imaging, medications or physical therapy

## 2023-10-22 ENCOUNTER — Other Ambulatory Visit: Payer: Self-pay

## 2023-10-22 ENCOUNTER — Encounter: Payer: Self-pay | Admitting: Certified Nurse Midwife

## 2023-10-22 ENCOUNTER — Ambulatory Visit (INDEPENDENT_AMBULATORY_CARE_PROVIDER_SITE_OTHER): Admitting: Certified Nurse Midwife

## 2023-10-22 VITALS — BP 113/79 | HR 89 | Wt 198.8 lb

## 2023-10-22 DIAGNOSIS — Z713 Dietary counseling and surveillance: Secondary | ICD-10-CM | POA: Diagnosis not present

## 2023-10-22 DIAGNOSIS — Z7689 Persons encountering health services in other specified circumstances: Secondary | ICD-10-CM

## 2023-10-22 DIAGNOSIS — Z3A36 36 weeks gestation of pregnancy: Secondary | ICD-10-CM

## 2023-10-22 DIAGNOSIS — Z8639 Personal history of other endocrine, nutritional and metabolic disease: Secondary | ICD-10-CM

## 2023-10-22 MED ORDER — NALTREXONE-BUPROPION HCL ER 8-90 MG PO TB12
ORAL_TABLET | ORAL | 1 refills | Status: DC
Start: 2023-10-22 — End: 2023-10-28
  Filled 2023-10-22: qty 120, 30d supply, fill #0
  Filled 2023-10-22: qty 120, 47d supply, fill #0
  Filled 2023-10-27: qty 78, 30d supply, fill #0
  Filled 2023-10-27: qty 120, 30d supply, fill #0

## 2023-10-22 NOTE — Progress Notes (Signed)
 SUBJECTIVE:  38 y.o. here for follow-up weight loss visit, previously seen several months ago and started on phentermine . Pt state she had to stop the medication because she was experiencing migraine headaches with it. She was trying to do it without medication and has struggled. She would like to try contrave . She has joined [employer] with her partner and is serious about losing weight.  Pt very fatigued requesting vitamin D  screen. Has history of deficiency   OBJECTIVE:  BP 113/79 (BP Location: Right Arm, Patient Position: Sitting, Cuff Size: Normal)   Pulse 89   Wt 198 lb 12.8 oz (90.2 kg)   LMP 10/23/2015   BMI 36.36 kg/m   Body mass index is 36.36 kg/m. Patient appears well. ASSESSMENT: she has gained a11 lbs since her last visit  PLAN:  start contrave  as ordered RTC in 6- 8 weeks as planned  Alise Appl, CNM

## 2023-10-23 LAB — VITAMIN D 25 HYDROXY (VIT D DEFICIENCY, FRACTURES): Vit D, 25-Hydroxy: 23.7 ng/mL — ABNORMAL LOW (ref 30.0–100.0)

## 2023-10-24 ENCOUNTER — Other Ambulatory Visit: Payer: Self-pay

## 2023-10-24 ENCOUNTER — Other Ambulatory Visit: Payer: Self-pay | Admitting: Certified Nurse Midwife

## 2023-10-24 ENCOUNTER — Encounter: Payer: Self-pay | Admitting: Certified Nurse Midwife

## 2023-10-24 MED ORDER — CHOLECALCIFEROL 1.25 MG (50000 UT) PO CAPS
ORAL_CAPSULE | ORAL | 3 refills | Status: DC
  Filled 2023-10-24: qty 1, fill #0
  Filled 2023-10-27: qty 1, 7d supply, fill #0

## 2023-10-25 ENCOUNTER — Telehealth: Payer: Self-pay

## 2023-10-27 ENCOUNTER — Other Ambulatory Visit: Payer: Self-pay | Admitting: Certified Nurse Midwife

## 2023-10-27 ENCOUNTER — Other Ambulatory Visit: Payer: Self-pay

## 2023-10-27 MED ORDER — CHOLECALCIFEROL 1.25 MG (50000 UT) PO CAPS
50000.0000 [IU] | ORAL_CAPSULE | ORAL | 3 refills | Status: DC
Start: 2023-10-27 — End: 2024-04-27
  Filled 2023-10-27: qty 4, 28d supply, fill #0
  Filled 2023-11-26: qty 4, 28d supply, fill #1
  Filled 2023-12-27: qty 4, 28d supply, fill #2
  Filled 2024-03-23: qty 4, 28d supply, fill #3

## 2023-10-27 NOTE — Telephone Encounter (Signed)
 Spoke with Laura [name] at [facility] pharmacy to advise Contrave  Prior auth approved. He advised there is a $300 copay. Advised patient to go onto contrave  website to obtain coupon card.

## 2023-10-27 NOTE — Telephone Encounter (Addendum)
 Laura [name]

## 2023-10-27 NOTE — Telephone Encounter (Signed)
Approved. Pt aware

## 2023-10-27 NOTE — Telephone Encounter (Signed)
 Patient aware. [email or web address]

## 2023-10-28 ENCOUNTER — Other Ambulatory Visit: Payer: Self-pay

## 2023-10-28 ENCOUNTER — Other Ambulatory Visit: Payer: Self-pay | Admitting: Certified Nurse Midwife

## 2023-10-28 ENCOUNTER — Encounter: Payer: Self-pay | Admitting: Certified Nurse Midwife

## 2023-10-28 MED ORDER — CYANOCOBALAMIN 1000 MCG/ML IJ SOLN
1000.0000 ug | Freq: Once | INTRAMUSCULAR | 3 refills | Status: AC
Start: 2023-10-28 — End: 2024-05-02
  Filled 2023-10-28: qty 1, 1d supply, fill #0
  Filled 2024-02-10: qty 1, 1d supply, fill #1
  Filled 2024-03-01: qty 1, 1d supply, fill #2
  Filled 2024-04-27: qty 1, 1d supply, fill #3

## 2023-10-28 MED ORDER — PHENTERMINE HCL 37.5 MG PO TABS
37.5000 mg | ORAL_TABLET | Freq: Every day | ORAL | 0 refills | Status: DC
  Filled 2023-10-28: qty 15, 15d supply, fill #0

## 2023-11-01 ENCOUNTER — Other Ambulatory Visit: Payer: Self-pay

## 2023-11-12 ENCOUNTER — Other Ambulatory Visit: Payer: Self-pay

## 2023-11-12 ENCOUNTER — Other Ambulatory Visit: Payer: Self-pay | Admitting: Certified Nurse Midwife

## 2023-11-12 MED ORDER — PHENTERMINE HCL 37.5 MG PO TABS
37.5000 mg | ORAL_TABLET | Freq: Every day | ORAL | 0 refills | Status: DC
  Filled 2023-11-12: qty 15, 15d supply, fill #0

## 2023-11-12 NOTE — Progress Notes (Signed)
 Pt restarted on phentermine  trial x 2 weeks. She notes no headaches only dry mouth. BP readings as follows.   pressure readings are below. 10/28/23- BP 116/78, HR 89 10/29/23- BP 110/77 HR 114 11/02/23- BP 112/76 HR 89 11/03/23- BP 116/75 HR 104 11/04/23- BP 104/83 HR 83 11/05/23- BP 122/88 HR 80 11/08/23- BP 124/80 HR 82 11/09/23- BP 126/80 HR 94 11/10/23- BP 116/80 HR 89 11/12/23- BP 112/77 HR 103  Refill x 15 days. Pt to return in 2 weeks for weight check and BP check.   Alise Appl, CNM

## 2023-11-26 ENCOUNTER — Other Ambulatory Visit: Payer: Self-pay

## 2023-11-29 ENCOUNTER — Other Ambulatory Visit: Payer: Self-pay

## 2023-12-02 ENCOUNTER — Encounter: Payer: Self-pay | Admitting: Certified Nurse Midwife

## 2023-12-02 ENCOUNTER — Other Ambulatory Visit: Payer: Self-pay

## 2023-12-02 ENCOUNTER — Ambulatory Visit (INDEPENDENT_AMBULATORY_CARE_PROVIDER_SITE_OTHER): Admitting: Certified Nurse Midwife

## 2023-12-02 VITALS — BP 112/77 | HR 105 | Ht 62.0 in | Wt 194.2 lb

## 2023-12-02 DIAGNOSIS — Z6835 Body mass index (BMI) 35.0-35.9, adult: Secondary | ICD-10-CM

## 2023-12-02 DIAGNOSIS — E669 Obesity, unspecified: Secondary | ICD-10-CM

## 2023-12-02 DIAGNOSIS — Z7689 Persons encountering health services in other specified circumstances: Secondary | ICD-10-CM

## 2023-12-02 MED ORDER — PHENTERMINE HCL 37.5 MG PO TABS
37.5000 mg | ORAL_TABLET | Freq: Every day | ORAL | 0 refills | Status: DC
  Filled 2023-12-02: qty 30, 30d supply, fill #0

## 2023-12-02 NOTE — Progress Notes (Signed)
 SUBJECTIVE:  38 y.o. here for follow-up weight loss visit, previously seen 4 weeks ago. Denies any concerns and feels like medication is working she lost 4 lbs . She is walking daily for exercise.   OBJECTIVE:  BP 112/77   Pulse (!) 105   Ht 5\' 2"  (1.575 m)   Wt 194 lb 3.2 oz (88.1 kg)   LMP 10/23/2015   BMI 35.52 kg/m   Body mass index is 35.52 kg/m. Waist: 39 in  Patient appears well. ASSESSMENT:  Obesity- responding well to weight loss plan PLAN:  To continue with current medications. B12 1000mcg/ml injection given RTC in 4 weeks as planned  Laura [name], CNM

## 2023-12-27 ENCOUNTER — Other Ambulatory Visit: Payer: Self-pay

## 2024-02-10 ENCOUNTER — Other Ambulatory Visit: Payer: Self-pay

## 2024-03-23 ENCOUNTER — Other Ambulatory Visit: Payer: Self-pay

## 2024-03-23 NOTE — Progress Notes (Unsigned)
 Cardiology Clinic Note   Date: 03/24/2024 ID: COURTNE LIGHTY, DOB Sep 10, 1985, MRN [id number]  Primary Cardiologist:  Redell Cave, MD  Chief Complaint   Laura [name] is a 38 y.o. female who presents to the clinic today for evaluation of tachycardia.   Patient Profile   Laura [name] is followed by Dr. Cave for the history outlined below.      Past medical history significant for: Dyspnea. Echo 08/04/2021: EF 55 to 60%.  No RWMA.  Normal diastolic parameters.  Normal RV size/function.  Trivial MR. Tachycardia. 14-day Zio 08/05/2021: HR 58 to 169 bpm, average 88 bpm.  Rare ectopy.  No significant arrhythmias.  Patient triggered events associated with sinus rhythm. PTSD. Depression. Tobacco abuse.  In summary, patient was first evaluated by Dr. Cave on 03/24/2021 for tachycardia.  Patient reported a 89-month history of elevated heart rates in the 160s with minimal exertion.  She was initially started on propranolol  as needed and began taking it daily with improvement in heart rates to 90s to low 100s.  She denied palpitations.  EKG demonstrated NSR.  Propranolol  was increased to 10 mg twice daily.  Upon follow-up in January 2023 she reported continued episodes of tachycardia with associated diaphoresis and occasional dyspnea.  She increased propranolol  to 10 mg 4 times daily without improvement.  Propranolol  was discontinued and she was started on metoprolol  25 mg twice daily.  She underwent echo which demonstrated normal LV/RV function.  14-day ZIO demonstrated no significant arrhythmias.  Patient was last seen in the office by Dr. Cave on 08/22/2021 for follow-up after testing.  She reported improvement in heart rate into the low 80s with changed to metoprolol .  She had been taking metoprolol  25 mg once a day.  Decision was made to finish current prescription of Lopressor  at 12.5 mg twice daily and then begin Toprol  25 mg daily.     History of Present  Illness    Today, patient reports 2 weeks of increased heart rate and palpitations described as skipped beats. Patient states heart rate is elevating to the 120s and even as high as 150-180 at times while at rest. Two nights ago she had the longest episode with heart rate staying around 158 bpm for a couple of hours. Elevated heart rate can occur at any time. She notes skipped beats typically at night. She was walking 2-3 miles for exercise twice a week but has stopped secondary to her heart rate getting too high. She reports associated dyspnea with palpitations that will worsen with exertion. She reports she stopped Toprol  about 1 month after she started it in February 2023 secondary to her heart rate normalizing to 90-100. No further issues with heart rate until 2 weeks ago. She was started on phentermine  for weight loss but decided to only take it for 30 days then stop it secondary to reading about it causing elevated heart rate. She has noted recently lower extremity edema at the end of the day. 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 She continues to smoke 8-10 cigarettes weekdays and 10-12 cigarettes on weekends. She drinks a 12 oz coffee in the morning and an 8 oz caffenated soda in the afternoon.     ROS: All other systems reviewed and are otherwise negative except as noted in History of Present Illness.  EKGs/Labs Reviewed    EKG Interpretation Date/Time:  Friday March 24 2024 14:07:17 EDT Ventricular Rate:  104 PR Interval:  152 QRS  Duration:  80 QT Interval:  342 QTC Calculation: 449 R Axis:   48  Text Interpretation: Sinus tachycardia No previous ECGs available Confirmed by Loistine Sober [phone]) on 03/24/2024 2:18:17 PM    Risk Assessment/Calculations        STOP-Bang Score:  3       Physical Exam    VS:  BP 114/80 (BP Location: Left Arm, Patient Position: Sitting, Cuff Size: Normal)   Pulse (!) 104   Ht 5' 3 (1.6 m)   Wt 206 lb 3.2 oz (93.5 kg)   LMP  10/23/2015   SpO2 100%   BMI 36.53 kg/m  , BMI Body mass index is 36.53 kg/m.  GEN: Well nourished, well developed, in no acute distress. Neck: No JVD or carotid bruits. Cardiac:  RRR.  No murmur. No rubs or gallops.   Respiratory:  Respirations regular and unlabored. Clear to auscultation without rales, wheezing or rhonchi. GI: Soft, nontender, nondistended. Extremities: Radials/DP/PT 2+ and equal bilaterally. No clubbing or cyanosis. No edema.  Skin: Warm and dry, no rash. Neuro: Strength intact.  Assessment & Plan   Dyspnea Echo February 2023 demonstrated normal LV/RV function, normal diastolic parameters, trivial MR.  Patient reports 2 week history of DOE related to palpitations/tachycardia. She reports her heart rate will get very high with exertion and she will experience increased dyspnea. Episodes can last minutes to hours. She reports occasional lower extremity edema at the end of the day. Euvolemic and well compensated on exam.  - Recommend compression socks.  - Schedule echo.   Tachycardia/palpitations 14-day Zio February 2023 demonstrated HR 50 to 169 bpm, average 88 bpm, rare ectopy, no significant arrhythmias, patient triggered events associated with sinus rhythm.  Patient reports a 2 week history of elevated heart rate as high as 150-180 at rest and worse with exertion. She reports associated dyspnea. She has been off Toprol  since March 2023. HR normally 90-100. EKG demonstrates sinus tachycardia.  - Start Toprol  12.5 mg nightly x 1 week then increase to 25 mg.  - 14 day Zio.  - CBC, BMP, Mg, TSH today.   Sleep disordered breathing Patient reports poor sleep hygiene. Stop bang score 3.  - Refer to pulmonology.   Tobacco abuse Patient smokes < 1 pack of cigarettes a day. She is interested in quitting.  - Encouraged complete cessation.   Disposition: CBC, BMP, Mg, TSH today. Schedule echo. 14 day zio. Pulmonology referral. Return in 10-12 weeks or sooner as needed.           Signed, Sober HERO. Dodd Schmid, DNP, NP-C

## 2024-03-24 ENCOUNTER — Ambulatory Visit (INDEPENDENT_AMBULATORY_CARE_PROVIDER_SITE_OTHER)

## 2024-03-24 ENCOUNTER — Other Ambulatory Visit: Payer: Self-pay

## 2024-03-24 ENCOUNTER — Ambulatory Visit: Attending: Student | Admitting: Student

## 2024-03-24 ENCOUNTER — Encounter: Payer: Self-pay | Admitting: Student

## 2024-03-24 VITALS — BP 114/80 | HR 104 | Ht 63.0 in | Wt 206.2 lb

## 2024-03-24 DIAGNOSIS — R002 Palpitations: Secondary | ICD-10-CM | POA: Diagnosis not present

## 2024-03-24 DIAGNOSIS — R0609 Other forms of dyspnea: Secondary | ICD-10-CM | POA: Diagnosis not present

## 2024-03-24 DIAGNOSIS — Z720 Tobacco use: Secondary | ICD-10-CM

## 2024-03-24 DIAGNOSIS — R000 Tachycardia, unspecified: Secondary | ICD-10-CM

## 2024-03-24 DIAGNOSIS — G4730 Sleep apnea, unspecified: Secondary | ICD-10-CM

## 2024-03-24 MED ORDER — METOPROLOL SUCCINATE ER 25 MG PO TB24
12.5000 mg | ORAL_TABLET | Freq: Every day | ORAL | 0 refills | Status: DC
  Filled 2024-03-24: qty 6, 12d supply, fill #0

## 2024-03-24 MED ORDER — METOPROLOL SUCCINATE ER 25 MG PO TB24
25.0000 mg | ORAL_TABLET | Freq: Every day | ORAL | 3 refills | Status: DC
  Filled 2024-03-24: qty 90, 90d supply, fill #0

## 2024-03-24 NOTE — Patient Instructions (Signed)
 Medication Instructions:   Your physician recommends the following medication changes.  START TAKING: Metoprolol  Succinate (Toprol ) 12.5 mg (1/2 tablet) once daily for 7 days Metoprolol  Succinate (Toprol ) 25 mg (1 tablet) once daily  *If you need a refill on your cardiac medications before your next appointment, please call your pharmacy*  Lab Work:  Your provider would like for you to have following labs drawn today BMet, CBC, Magnesium, TSH.    If you have labs (blood work) drawn today and your tests are completely normal, you will receive your results only by:  MyChart Message (if you have MyChart) OR  A paper copy in the mail If you have any lab test that is abnormal or we need to change your treatment, we will call you to review the results.  Testing/Procedures:  Your physician has requested that you have an echocardiogram. Echocardiography is a painless test that uses sound waves to create images of your heart. It provides your doctor with information about the size and shape of your heart and how well your heart's chambers and valves are working.   You may receive an ultrasound enhancing agent through an IV if needed to better visualize your heart during the echo. This procedure takes approximately one hour.  There are no restrictions for this procedure.  This will take place at [address] [facility]) [address], [place] [address]  Please note: We ask at that you not bring children with you during ultrasound (echo/ vascular) testing. Due to room size and safety concerns, children are not allowed in the ultrasound rooms during exams. Our front office staff cannot provide observation of children in our lobby area while testing is being conducted. An adult accompanying a patient to their appointment will only be allowed in the ultrasound room at the discretion of the ultrasound technician under special circumstances. We apologize for any inconvenience.   ZIO XT- Long  Term Monitor Instructions  Your physician has requested you wear a ZIO patch monitor for 14 days.  This is a single patch monitor. [employer] supplies one patch monitor per enrollment. Additional stickers are not available. Please do not apply patch if you will be having a Nuclear Stress Test, Echocardiogram, Cardiac CT, MRI, or Chest Xray during the period you would be wearing the monitor. The patch cannot be worn during these tests. You cannot remove and re-apply the ZIO XT patch monitor.  Your ZIO patch monitor will be mailed 3 day USPS to your address on file. It may take 3-5 days to receive your monitor after you have been enrolled. Once you have received your monitor, please review the enclosed instructions. Your monitor has already been registered assigning a specific monitor serial number to you.  Billing and Patient Assistance Program Information  We have supplied [employer] with any of your insurance information on file for billing purposes.  [employer] offers a sliding scale Patient Assistance Program for patients that do not have insurance, or whose insurance does not completely cover the cost of the ZIO monitor.  You must apply for the Patient Assistance Program to qualify for this discounted rate.  To apply, please call [employer] at [phone], select option 4, select option 2, ask to apply for Patient Assistance Program. Meredeth will ask your household income, and how many people are in your household. They will quote your out-of-pocket cost based on that information. [employer] will also be able to set up a 89-month, interest-free payment plan if needed.  Applying the monitor  Shave hair from upper left chest.  Hold abrader disc by orange tab. Rub abrader in 40 strokes over the upper left chest as indicated in your monitor instructions.  Clean area with 4 enclosed alcohol pads. Let dry.  Apply patch as indicated in monitor instructions. Patch will be placed under collarbone on left side of chest  with arrow pointing upward.  Rub patch adhesive wings for 2 minutes. Remove white label marked 1. Remove the white label marked 2. Rub patch adhesive wings for 2 additional minutes.  While looking in a mirror, press and release button in center of patch. A small green light will flash 3-4 times. This will be your only indicator that the monitor has been turned on.  Do not shower for the first 24 hours. You may shower after the first 24 hours.  Press the button if you feel a symptom. You will hear a small click. Record Date, Time and Symptom in the Patient Logbook.  When you are ready to remove the patch, follow instructions on the last 2 pages of Patient Logbook.  Stick patch monitor into the tabs at the bottom of the return box.  Place Patient Logbook in the blue and white box. Use locking tab on box and tape box closed securely. The blue and white box has prepaid postage on it. Please place it in the mailbox as soon as possible. Your physician should have your test results approximately 7-14 days after the monitor has been mailed back to [facility].  Call [facility] Customer Care at [phone] if you have questions regarding your ZIO XT patch monitor.  Call them immediately if you see an orange light blinking on your monitor.  If your monitor falls off in less than 4 days, contact our Monitor department at [phone].  If your monitor becomes loose or falls off after 4 days call [employer] at [phone] for suggestions on securing your monitor.   Referrals:  None ordered at this time   Follow-Up:  At [facility], you and your health needs are our priority.  As part of our continuing mission to provide you with exceptional heart care, our providers are all part of one team.  This team includes your primary Cardiologist (physician) and Advanced Practice Providers or APPs (Physician Assistants and Nurse Practitioners) who all work together to provide you with the care  you need, when you need it.  Your next appointment:   11 week(s)  Provider:    Redell Cave, MD or Barnie Hila, NP    We recommend signing up for the patient portal called MyChart.  Sign up information is provided on this After Visit Summary.  MyChart is used to connect with patients for Virtual Visits (Telemedicine).  Patients are able to view lab/test results, encounter notes, upcoming appointments, etc.  Non-urgent messages can be sent to your provider as well.   To learn more about what you can do with MyChart, go to [email or web address].

## 2024-03-25 ENCOUNTER — Ambulatory Visit: Payer: Self-pay | Admitting: Student

## 2024-03-25 LAB — CBC
Hematocrit: 43.7 % (ref 34.0–46.6)
Hemoglobin: 14.1 g/dL (ref 11.1–15.9)
MCH: 31.3 pg (ref 26.6–33.0)
MCHC: 32.3 g/dL (ref 31.5–35.7)
MCV: 97 fL (ref 79–97)
Platelets: 251 x10E3/uL (ref 150–450)
RBC: 4.5 x10E6/uL (ref 3.77–5.28)
RDW: 13.1 % (ref 11.7–15.4)
WBC: 6.2 x10E3/uL (ref 3.4–10.8)

## 2024-03-25 LAB — MAGNESIUM: Magnesium: 2 mg/dL (ref 1.6–2.3)

## 2024-03-25 LAB — BASIC METABOLIC PANEL WITH GFR
BUN/Creatinine Ratio: 12 (ref 9–23)
BUN: 10 mg/dL (ref 6–20)
CO2: 22 mmol/L (ref 20–29)
Calcium: 8.8 mg/dL (ref 8.7–10.2)
Chloride: 105 mmol/L (ref 96–106)
Creatinine, Ser: 0.84 mg/dL (ref 0.57–1.00)
Glucose: 68 mg/dL — ABNORMAL LOW (ref 70–99)
Potassium: 4.4 mmol/L (ref 3.5–5.2)
Sodium: 140 mmol/L (ref 134–144)
eGFR: 92 mL/min/1.73 (ref >59)

## 2024-03-25 LAB — TSH: TSH: 0.993 u[IU]/mL (ref 0.450–4.500)

## 2024-03-27 ENCOUNTER — Other Ambulatory Visit: Payer: Self-pay | Admitting: Emergency Medicine

## 2024-03-27 DIAGNOSIS — G4730 Sleep apnea, unspecified: Secondary | ICD-10-CM

## 2024-03-27 NOTE — Progress Notes (Signed)
 Orders placed for referral to Pulmonary for Sleep-disordered breathing.

## 2024-03-28 NOTE — Progress Notes (Signed)
 Last read by Rollo JINNY Delana Odean at 10:05AM on 03/27/2024.

## 2024-04-07 ENCOUNTER — Ambulatory Visit: Admitting: Sleep Medicine

## 2024-04-11 ENCOUNTER — Encounter: Payer: Self-pay | Admitting: Sleep Medicine

## 2024-04-13 ENCOUNTER — Telehealth: Payer: Self-pay | Admitting: Emergency Medicine

## 2024-04-13 NOTE — Telephone Encounter (Signed)
 Called both phone numbers listed, and both appear to be disconnected. Got a recording stating Call cannot be completed, please consult your directory and try again.

## 2024-04-14 ENCOUNTER — Ambulatory Visit: Admitting: Cardiology

## 2024-04-21 DIAGNOSIS — R002 Palpitations: Secondary | ICD-10-CM | POA: Diagnosis not present

## 2024-04-24 ENCOUNTER — Telehealth: Admitting: Family Medicine

## 2024-04-24 ENCOUNTER — Other Ambulatory Visit: Payer: Self-pay

## 2024-04-24 DIAGNOSIS — J019 Acute sinusitis, unspecified: Secondary | ICD-10-CM

## 2024-04-24 DIAGNOSIS — B9689 Other specified bacterial agents as the cause of diseases classified elsewhere: Secondary | ICD-10-CM | POA: Diagnosis not present

## 2024-04-24 DIAGNOSIS — R051 Acute cough: Secondary | ICD-10-CM | POA: Diagnosis not present

## 2024-04-24 MED ORDER — IPRATROPIUM BROMIDE 0.03 % NA SOLN
2.0000 | Freq: Two times a day (BID) | NASAL | 0 refills | Status: AC
  Filled 2024-04-24: qty 30, 44d supply, fill #0

## 2024-04-24 MED ORDER — PROMETHAZINE-DM 6.25-15 MG/5ML PO SYRP
5.0000 mL | ORAL_SOLUTION | Freq: Four times a day (QID) | ORAL | 0 refills | Status: AC | PRN
  Filled 2024-04-24: qty 118, 6d supply, fill #0

## 2024-04-24 MED ORDER — LEVOCETIRIZINE DIHYDROCHLORIDE 5 MG PO TABS
5.0000 mg | ORAL_TABLET | Freq: Every evening | ORAL | 0 refills | Status: DC
  Filled 2024-04-24: qty 30, 30d supply, fill #0

## 2024-04-24 MED ORDER — DOXYCYCLINE HYCLATE 100 MG PO TABS
100.0000 mg | ORAL_TABLET | Freq: Two times a day (BID) | ORAL | 0 refills | Status: AC
  Filled 2024-04-24: qty 14, 7d supply, fill #0

## 2024-04-24 NOTE — Progress Notes (Signed)
 E-Visit for Sinus Problems  We are sorry that you are not feeling well.  Here is how we plan to help!  Based on what you have shared with me it looks like you have sinusitis.  Sinusitis is inflammation and infection in the sinus cavities of the head.  Based on your presentation I believe you most likely have Acute Bacterial Sinusitis.  This is an infection caused by bacteria and is treated with antibiotics. I have prescribed Doxycycline  100mg  by mouth twice a day for 7 days., Because of certain medication allergies/intolerances, I have prescribed Doxycycline  100mg  Take 1 tablet by mouth twice daily for 7 days, I have also prescribed Ipratropium Bromide Nasal Spray Use 1 spray in each nostril twice daily as needed for drainage; discontinue if too drying, and Levocetirizine 10mg  Take 1 tablet at bedtime As well as a cough syrup= Promethazine  DM- do not use with driving it makes you sleepy. Do not use with OTC cough medications. You may use an oral decongestant such as Mucinex D or if you have glaucoma or high blood pressure use plain Mucinex. Saline nasal spray help and can safely be used as often as needed for congestion.  If you develop worsening sinus pain, fever or notice severe headache and vision changes, or if symptoms are not better after completion of antibiotic, please schedule an appointment with a health care provider.    Sinus infections are not as easily transmitted as other respiratory infection, however we still recommend that you avoid close contact with loved ones, especially the very young and elderly.  Remember to wash your hands thoroughly throughout the day as this is the number one way to prevent the spread of infection!  Home Care: Only take medications as instructed by your medical team. Complete the entire course of an antibiotic. Do not take these medications with alcohol. A steam or ultrasonic humidifier can help congestion.  You can place a towel over your head and breathe  in the steam from hot water coming from a faucet. Avoid close contacts especially the very young and the elderly. Cover your mouth when you cough or sneeze. Always remember to wash your hands.  Get Help Right Away If: You develop worsening fever or sinus pain. You develop a severe head ache or visual changes. Your symptoms persist after you have completed your treatment plan.  Make sure you Understand these instructions. Will watch your condition. Will get help right away if you are not doing well or get worse.  Your e-visit answers were reviewed by a board certified advanced clinical practitioner to complete your personal care plan.  Depending on the condition, your plan could have included both over the counter or prescription medications.  If there is a problem please reply  once you have received a response from your provider.  Your safety is important to us .  If you have drug allergies check your prescription carefully.    You can use MyChart to ask questions about today's visit, request a non-urgent call back, or ask for a work or school excuse for 24 hours related to this e-Visit. If it has been greater than 24 hours you will need to follow up with your provider, or enter a new e-Visit to address those concerns.  You will get an e-mail in the next two days asking about your experience.  I hope that your e-visit has been valuable and will speed your recovery. Thank you for using e-visits.  I have spent 5 minutes in  review of e-visit questionnaire, review and updating patient chart, medical decision making and response to patient.   Chiquita CHRISTELLA Barefoot, NP

## 2024-04-25 DIAGNOSIS — R002 Palpitations: Secondary | ICD-10-CM | POA: Diagnosis not present

## 2024-04-26 NOTE — Progress Notes (Signed)
 Last read by Rollo JINNY Delana Odean at 1:36PM on 04/26/2024.

## 2024-04-27 ENCOUNTER — Other Ambulatory Visit: Payer: Self-pay

## 2024-04-27 ENCOUNTER — Other Ambulatory Visit: Payer: Self-pay | Admitting: Certified Nurse Midwife

## 2024-04-27 MED ORDER — CHOLECALCIFEROL 1.25 MG (50000 UT) PO CAPS
50000.0000 [IU] | ORAL_CAPSULE | ORAL | 3 refills | Status: AC
  Filled 2024-04-27: qty 4, 28d supply, fill #0
  Filled 2024-07-05: qty 4, 28d supply, fill #1

## 2024-05-03 ENCOUNTER — Telehealth: Admitting: Physician Assistant

## 2024-05-03 ENCOUNTER — Other Ambulatory Visit: Payer: Self-pay

## 2024-05-03 DIAGNOSIS — J208 Acute bronchitis due to other specified organisms: Secondary | ICD-10-CM

## 2024-05-03 DIAGNOSIS — B9689 Other specified bacterial agents as the cause of diseases classified elsewhere: Secondary | ICD-10-CM | POA: Diagnosis not present

## 2024-05-03 MED ORDER — AZITHROMYCIN 250 MG PO TABS
ORAL_TABLET | ORAL | 0 refills | Status: AC
  Filled 2024-05-03: qty 6, 5d supply, fill #0

## 2024-05-03 MED ORDER — PSEUDOEPH-BROMPHEN-DM 30-2-10 MG/5ML PO SYRP
5.0000 mL | ORAL_SOLUTION | Freq: Four times a day (QID) | ORAL | 0 refills | Status: DC | PRN
  Filled 2024-05-03: qty 118, 6d supply, fill #0

## 2024-05-03 MED ORDER — PREDNISONE 10 MG PO TABS
ORAL_TABLET | ORAL | 0 refills | Status: AC
  Filled 2024-05-03: qty 21, 6d supply, fill #0

## 2024-05-03 MED ORDER — ALBUTEROL SULFATE HFA 108 (90 BASE) MCG/ACT IN AERS
1.0000 | INHALATION_SPRAY | Freq: Four times a day (QID) | RESPIRATORY_TRACT | 0 refills | Status: AC | PRN
  Filled 2024-05-03: qty 8.5, 30d supply, fill #0

## 2024-05-03 NOTE — Progress Notes (Signed)
 We are sorry that you are not feeling well.  Here is how we plan to help!  Based on your presentation I believe you most likely have A cough due to bacteria.  When patients have a fever and a productive cough with a change in color or increased sputum production, we are concerned about bacterial bronchitis.  If left untreated it can progress to pneumonia.  If your symptoms do not improve with your treatment plan it is important that you contact your provider.   I have prescribed Azithromyin 250 mg: two tablets now and then one tablet daily for 4 additonal days    In addition you may use a prescription cough syrup called Bromfed DM Take 5mL every 6 hours as needed for cough.  Prednisone  10 mg daily for 6 days (see taper instructions below)  Directions for 6 day taper: Day 1: 2 tablets before breakfast, 1 after both lunch & dinner and 2 at bedtime Day 2: 1 tab before breakfast, 1 after both lunch & dinner and 2 at bedtime Day 3: 1 tab at each meal & 1 at bedtime Day 4: 1 tab at breakfast, 1 at lunch, 1 at bedtime Day 5: 1 tab at breakfast & 1 tab at bedtime Day 6: 1 tab at breakfast  From your responses in the eVisit questionnaire you describe inflammation in the upper respiratory tract which is causing a significant cough.  This is commonly called Bronchitis and has four common causes:   Allergies Viral Infections Acid Reflux Bacterial Infection Allergies, viruses and acid reflux are treated by controlling symptoms or eliminating the cause. An example might be a cough caused by taking certain blood pressure medications. You stop the cough by changing the medication. Another example might be a cough caused by acid reflux. Controlling the reflux helps control the cough.  USE OF BRONCHODILATOR (RESCUE) INHALERS: There is a risk from using your bronchodilator too frequently.  The risk is that over-reliance on a medication which only relaxes the muscles surrounding the breathing tubes can reduce  the effectiveness of medications prescribed to reduce swelling and congestion of the tubes themselves.  Although you feel brief relief from the bronchodilator inhaler, your asthma may actually be worsening with the tubes becoming more swollen and filled with mucus.  This can delay other crucial treatments, such as oral steroid medications. If you need to use a bronchodilator inhaler daily, several times per day, you should discuss this with your provider.  There are probably better treatments that could be used to keep your asthma under control.     HOME CARE Only take medications as instructed by your medical team. Complete the entire course of an antibiotic. Drink plenty of fluids and get plenty of rest. Avoid close contacts especially the very young and the elderly Cover your mouth if you cough or cough into your sleeve. Always remember to wash your hands A steam or ultrasonic humidifier can help congestion.   GET HELP RIGHT AWAY IF: You develop worsening fever. You become short of breath You cough up blood. Your symptoms persist after you have completed your treatment plan MAKE SURE YOU  Understand these instructions. Will watch your condition. Will get help right away if you are not doing well or get worse.  Your e-visit answers were reviewed by a board certified advanced clinical practitioner to complete your personal care plan.  Depending on the condition, your plan could have included both over the counter or prescription medications. If there is a  problem please reply  once you have received a response from your provider. Your safety is important to us .  If you have drug allergies check your prescription carefully.    You can use MyChart to ask questions about today's visit, request a non-urgent call back, or ask for a work or school excuse for 24 hours related to this e-Visit. If it has been greater than 24 hours you will need to follow up with your provider, or enter a new e-Visit  to address those concerns. You will get an e-mail in the next two days asking about your experience.  I hope that your e-visit has been valuable and will speed your recovery. Thank you for using e-visits.   I have spent 5 minutes in review of e-visit questionnaire, review and updating patient chart, medical decision making and response to patient.   Delon CHRISTELLA Dickinson, PA-C

## 2024-05-05 ENCOUNTER — Ambulatory Visit: Attending: Student

## 2024-05-05 DIAGNOSIS — R0609 Other forms of dyspnea: Secondary | ICD-10-CM | POA: Diagnosis not present

## 2024-05-09 MED ORDER — PROPRANOLOL HCL 10 MG PO TABS: 10.0000 mg | ORAL_TABLET | Freq: Two times a day (BID) | ORAL | 3 refills | Status: DC

## 2024-05-10 ENCOUNTER — Other Ambulatory Visit: Payer: Self-pay

## 2024-05-10 LAB — ECHOCARDIOGRAM COMPLETE
AR max vel: 2.52 cm2
AV Area VTI: 2.61 cm2
AV Area mean vel: 2.45 cm2
AV Mean grad: 6.5 mmHg
AV Peak grad: 11.4 mmHg
Ao pk vel: 1.69 m/s
Area-P 1/2: 4.21 cm2
MV M vel: 1.55 m/s
MV Peak grad: 9.6 mmHg
S' Lateral: 2.6 cm

## 2024-05-10 MED ORDER — PROPRANOLOL HCL 10 MG PO TABS
10.0000 mg | ORAL_TABLET | Freq: Two times a day (BID) | ORAL | 3 refills | Status: AC
  Filled 2024-05-10 (×2): qty 180, 90d supply, fill #0
  Filled 2024-07-05: qty 180, 90d supply, fill #1

## 2024-05-10 NOTE — Addendum Note (Signed)
 Addended by: BRIEN SALM on: 05/10/2024 07:45 AM   Modules accepted: Orders

## 2024-05-11 NOTE — Progress Notes (Signed)
 Last read by Rollo JINNY Delana Odean at 5:00PM on 05/10/2024.

## 2024-05-13 ENCOUNTER — Other Ambulatory Visit: Payer: Self-pay

## 2024-05-16 ENCOUNTER — Ambulatory Visit: Admitting: Internal Medicine

## 2024-06-05 NOTE — Progress Notes (Unsigned)
 Cardiology Clinic Note   Date: 06/05/2024 ID: Laura [name], DOB 1986-04-24, MRN [id number]  Primary Cardiologist:  Redell Cave, MD  Chief Complaint   Laura [name] is a 38 y.o. female who presents to the clinic today for ***  Patient Profile   Laura [name] is followed by *** for the history outlined below.       Past medical history significant for: Dyspnea. Echo 08/04/2021: EF 55 to 60%.  No RWMA.  Normal diastolic parameters.  Normal RV size/function.  Trivial MR.*** Echo 05/05/2024: Echo demonstrated EF 60 to 65%.  No RWMA.  Normal diastolic parameters.  Normal RV size/function.  No significant valvular abnormalities. Tachycardia. 14-day Zio 08/05/2021: HR 58 to 169 bpm, average 88 bpm.  Rare ectopy.  No significant arrhythmias.  Patient triggered events associated with sinus rhythm.*** 14-day Zio 04/24/2024: HR 63 to 183 bpm, average 96 bpm.  Predominantly sinus rhythm.  Rare ectopy.  No A-fib/flutter.  No significant or sustained arrhythmias. PTSD. Depression. Tobacco abuse.  In summary, patient was first evaluated by Dr. Cave on 03/24/2021 for tachycardia.  Patient reported a 74-month history of elevated heart rates in the 160s with minimal exertion.  She was initially started on propranolol  as needed and began taking it daily with improvement in heart rates to 90s to low 100s.  She denied palpitations.  EKG demonstrated NSR.  Propranolol  was increased to 10 mg twice daily.  Upon follow-up in January 2023 she reported continued episodes of tachycardia with associated diaphoresis and occasional dyspnea.  She increased propranolol  to 10 mg 4 times daily without improvement.  Propranolol  was discontinued and she was started on metoprolol  25 mg twice daily.  She underwent echo which demonstrated normal LV/RV function.  14-day ZIO demonstrated no significant arrhythmias.  Patient was seen in clinic on 08/22/2021 for follow-up after testing.  She reported  improvement in heart rate into the low 80s with change to metoprolol .  She had been taking metoprolol  25 mg once a day.  Decision was made to finish current prescription of Lopressor  at 12.5 mg twice daily and then begin Toprol  25 mg daily.   Patient was last seen in the office by me on 03/24/2024 for evaluation of tachycardia.  Patient reported a 2-week history of increased heart rate and palpitations described as skipped beats.  She reported heart rate elevated to the 120s even as high as 150-180 while at rest.  She reported elevated heart rate occurring anytime with skipped beats typically at night.  She was walking 2 to 3 miles last for exercise twice a week but stopped secondary to heart rate getting too high.  She reported dyspnea with palpitations that worsened with exertion.  She reported stopping Toprol  in February 2023 after her heart rate normalized to 90 to 100 bpm.  She was started on phentermine  for weight loss but decided to only take it for 30 days before stopping it secondary to reading that it can cause elevated heart rate.  She also noted recent lower extremity edema at the end of the day.  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  She was started on Toprol  12.5 mg daily.  Unfortunately she contacted the office to report it was causing lower extremity edema and she discontinued it.  14-day ZIO demonstrated HR 63 to 183 bpm, average 96 bpm.  Echo showed normal LV/RV function.     History of Present Illness    Today, patient ***  Dyspnea/lower extremity edema Echo November 2025 demonstrated normal LV/RV function, normal diastolic parameters, no significant valvular abnormalities.  Patient*** - Recommend compression socks.***   Tachycardia/palpitations 14-day Zio October 2025 demonstrated HR 63 to 183 bpm, average 96 bpm, rare ectopy, no significant arrhythmias.  Patient reported intolerance to Toprol  secondary to lower extremity edema.  She reports*** -***   Sleep disordered  breathing Patient reports poor sleep hygiene. Stop bang score 3.  - Refer to pulmonology.***   Tobacco abuse Patient smokes < 1 pack of cigarettes a day. She is interested in quitting.*** - Encouraged complete cessation.   ROS: All other systems reviewed and are otherwise negative except as noted in History of Present Illness.  EKGs/Labs Reviewed        03/24/2024: BUN 10; Creatinine, Ser 0.84; Potassium 4.4; Sodium 140   03/24/2024: Hemoglobin 14.1; WBC 6.2   03/24/2024: TSH 0.993   No results found for requested labs within last 365 days.  ***  Risk Assessment/Calculations    {Does this patient have ATRIAL FIBRILLATION?[phone]} No BP recorded.  {Refresh Note OR Click here to enter BP  :1}***   STOP-Bang Score:  3  { Consider Dx Sleep Disordered Breathing or Sleep Apnea  ICD G47.33          :1}     Physical Exam    VS:  LMP 10/23/2015  , BMI There is no height or weight on file to calculate BMI.  GEN: Well nourished, well developed, in no acute distress. Neck: No JVD or carotid bruits. Cardiac: *** RRR. *** No murmur. No rubs or gallops.   Respiratory:  Respirations regular and unlabored. Clear to auscultation without rales, wheezing or rhonchi. GI: Soft, nontender, nondistended. Extremities: Radials/DP/PT 2+ and equal bilaterally. No clubbing or cyanosis. No edema ***  Skin: Warm and dry, no rash. Neuro: Strength intact.  Assessment & Plan   ***  Disposition: ***     {Are you ordering a CV Procedure (e.g. stress test, cath, DCCV, TEE, etc)?   Press F2        :[id number]}   Signed, Barnie HERO. Georgi Tuel, DNP, NP-C

## 2024-06-09 ENCOUNTER — Encounter: Payer: Self-pay | Admitting: Student

## 2024-06-09 ENCOUNTER — Ambulatory Visit: Attending: Student | Admitting: Student

## 2024-06-09 VITALS — BP 130/88 | HR 94 | Resp 19 | Ht 62.0 in | Wt 212.0 lb

## 2024-06-09 DIAGNOSIS — R600 Localized edema: Secondary | ICD-10-CM

## 2024-06-09 DIAGNOSIS — G4730 Sleep apnea, unspecified: Secondary | ICD-10-CM

## 2024-06-09 DIAGNOSIS — Z720 Tobacco use: Secondary | ICD-10-CM

## 2024-06-09 DIAGNOSIS — R002 Palpitations: Secondary | ICD-10-CM

## 2024-06-09 DIAGNOSIS — R000 Tachycardia, unspecified: Secondary | ICD-10-CM | POA: Diagnosis not present

## 2024-06-09 NOTE — Addendum Note (Signed)
 Addended by: Takira Sherrin on: 06/09/2024 01:38 PM   Modules accepted: Orders

## 2024-06-09 NOTE — Patient Instructions (Signed)
 Medication Instructions:   Your physician recommends that you continue on your current medications as directed. Please refer to the Current Medication list given to you today.    *If you need a refill on your cardiac medications before your next appointment, please call your pharmacy*  Lab Work:  None ordered at this time   If you have labs (blood work) drawn today and your tests are completely normal, you will receive your results only by:  MyChart Message (if you have MyChart) OR  A paper copy in the mail If you have any lab test that is abnormal or we need to change your treatment, we will call you to review the results.  Testing/Procedures:  None ordered at this time   Referrals:  None ordered at this time   Follow-Up:  At [facility], you and your health needs are our priority.  As part of our continuing mission to provide you with exceptional heart care, our providers are all part of one team.  This team includes your primary Cardiologist (physician) and Advanced Practice Providers or APPs (Physician Assistants and Nurse Practitioners) who all work together to provide you with the care you need, when you need it.  Your next appointment:   1 year(s)  Provider:    Redell Cave, MD or Barnie Hila, NP    We recommend signing up for the patient portal called MyChart.  Sign up information is provided on this After Visit Summary.  MyChart is used to connect with patients for Virtual Visits (Telemedicine).  Patients are able to view lab/test results, encounter notes, upcoming appointments, etc.  Non-urgent messages can be sent to your provider as well.   To learn more about what you can do with MyChart, go to [email or web address].

## 2024-07-05 ENCOUNTER — Other Ambulatory Visit: Payer: Self-pay

## 2024-07-06 ENCOUNTER — Other Ambulatory Visit: Payer: Self-pay

## 2024-07-19 ENCOUNTER — Encounter: Payer: Self-pay | Admitting: Certified Nurse Midwife

## 2024-07-19 ENCOUNTER — Other Ambulatory Visit: Payer: Self-pay

## 2024-07-19 ENCOUNTER — Ambulatory Visit: Admitting: Certified Nurse Midwife

## 2024-07-19 ENCOUNTER — Other Ambulatory Visit

## 2024-07-19 VITALS — BP 129/80

## 2024-07-19 DIAGNOSIS — G4709 Other insomnia: Secondary | ICD-10-CM

## 2024-07-19 DIAGNOSIS — R5383 Other fatigue: Secondary | ICD-10-CM

## 2024-07-19 DIAGNOSIS — N951 Menopausal and female climacteric states: Secondary | ICD-10-CM | POA: Diagnosis not present

## 2024-07-19 DIAGNOSIS — G478 Other sleep disorders: Secondary | ICD-10-CM | POA: Diagnosis not present

## 2024-07-19 DIAGNOSIS — N898 Other specified noninflammatory disorders of vagina: Secondary | ICD-10-CM

## 2024-07-19 DIAGNOSIS — R4586 Emotional lability: Secondary | ICD-10-CM | POA: Insufficient documentation

## 2024-07-19 MED ORDER — ESTRADIOL 0.01 % VA CREA
0.5000 | TOPICAL_CREAM | Freq: Every day | VAGINAL | 12 refills | Status: AC
  Filled 2024-07-19: qty 42.5, 90d supply, fill #0

## 2024-07-19 MED ORDER — BUPROPION HCL ER (XL) 300 MG PO TB24
300.0000 mg | ORAL_TABLET | Freq: Every day | ORAL | 3 refills | Status: AC
  Filled 2024-07-19: qty 90, 90d supply, fill #0

## 2024-07-19 NOTE — Progress Notes (Signed)
 GYN ENCOUNTER NOTE  Subjective:       Laura [name] is a 39 y.o. G4P2003 female is here for gynecologic evaluation of the following issues:  1. Perimenopausal symptoms.  She has history of hysterectomy at age 39.  Low libido Insomnia Fatigue Mood changes Vaginal dryness   Pt state she had seen Dr. Starla in the past and was prescribed medication for libido that was not covered by her insurance so was not able to take. She notes that she had taken Wellbutrin  in the past but is not currently taking it. She was seen by cardiology for palpitations and tachycardia that have now resolved.  She currently uses smokes daily.    Gynecologic History Patient's last menstrual period was 11/22/2015. Contraception: status post hysterectomy Last Pap: 10/23/2015. Results were: normal Last mammogram: n/a .   Obstetric History OB History  Gravida Para Term Preterm AB Living  2 2 2   3   SAB IAB Ectopic Multiple Live Births     1 3    # Outcome Date GA Lbr Len/2nd Weight Sex Type Anes PTL Lv  2A Gravida 2010   4 lb 2.1 oz (1.873 kg) F CS-LTranv   LIV  2B Term 2010   4 lb 4.8 oz (1.95 kg) F CS-LTranv   LIV  1 Term 2009   7 lb 14.4 oz (3.583 kg) M Vag-Spont   LIV    Past Medical History:  Diagnosis Date   Anxiety    Bipolar disorder (HCC)    past hx of   Complication of anesthesia    woke up during surgery   Depression    Heavy periods    Incontinence of urine    Increased BMI    Indigestion    PTSD (post-traumatic stress disorder)    Tobacco user     Past Surgical History:  Procedure Laterality Date   CESAREAN SECTION  2010   LAPAROSCOPIC VAGINAL HYSTERECTOMY WITH SALPINGECTOMY Bilateral 12/16/2015   Procedure: LAPAROSCOPIC ASSISTED VAGINAL HYSTERECTOMY WITH BILATERAL SALPINGECTOMY;  Surgeon: Gladis DELENA Dollar, MD;  Location: [facility] ORS;  Service: Gynecology;  Laterality: Bilateral;   TOTAL ABDOMINAL HYSTERECTOMY W/ BILATERAL SALPINGOOPHORECTOMY  2017   TUBAL LIGATION  2011     Medications Ordered Prior to Encounter[1]  Allergies[2]  Social History   Socioeconomic History   Marital status: Married    Spouse name: Not on file   Number of children: Not on file   Years of education: Not on file   Highest education level: Not on file  Occupational History   Not on file  Tobacco Use   Smoking status: Every Day    Current packs/day: 0.25    Average packs/day: 0.3 packs/day for 11.0 years (2.8 ttl pk-yrs)    Types: Cigarettes   Smokeless tobacco: Never  Vaping Use   Vaping status: Every Day  Substance and Sexual Activity   Alcohol use: Yes    Alcohol/week: 0.0 - 6.0 standard drinks of alcohol    Comment: occasionally; beer   Drug use: No   Sexual activity: Yes    Birth control/protection: Surgical  Other Topics Concern   Not on file  Social History Narrative   Not on file   Social Drivers of Health   Tobacco Use: High Risk (06/09/2024)   Patient History    Smoking Tobacco Use: Every Day    Smokeless Tobacco Use: Never    Passive Exposure: Not on file  Financial Resource Strain: Not on file  Food  Insecurity: Not on file  Transportation Needs: Not on file  Physical Activity: Not on file  Stress: Not on file  Social Connections: Not on file  Intimate Partner Violence: Not on file  Depression (PHQ2-9): High Risk (02/09/2023)   Depression (PHQ2-9)    PHQ-2 Score: 19  Alcohol Screen: Not on file  Housing: Not on file  Utilities: Not on file  Health Literacy: Not on file    Family History  Problem Relation Age of Onset   Diabetes Mother    Mental illness Mother    Hyperlipidemia Mother    Hypertension Mother    Diabetes Maternal Grandmother    Heart disease Maternal Grandfather    Cancer Neg Hx     The following portions of the patient's history were reviewed and updated as appropriate: allergies, current medications, past family history, past medical history, past social history, past surgical history and problem list.  Review  of Systems Review of Systems - Negative except as mentioned in HPI Review of Systems - General ROS: negative for - chills, fatigue, fever, hot flashes, malaise or night sweats Hematological and Lymphatic ROS: negative for - bleeding problems or swollen lymph nodes Gastrointestinal ROS: negative for - abdominal pain, blood in stools, change in bowel habits and nausea/vomiting Musculoskeletal ROS: negative for - joint pain, muscle pain or muscular weakness Genito-Urinary ROS: negative for - change in menstrual cycle, dysmenorrhea, dyspareunia, dysuria, genital discharge, genital ulcers, hematuria, incontinence, irregular/heavy menses, nocturia or pelvic pain. Positive for perimenopausal symptoms  Objective:   LMP 11/22/2015  CONSTITUTIONAL: Well-developed, well-nourished female in no acute distress.  HENT:  Normocephalic, atraumatic.  NECK: Normal range of motion, supple, no masses.  Normal thyroid .  SKIN: Skin is warm and dry. No rash noted. Not diaphoretic. No erythema. No pallor. NEUROLGIC: Alert and oriented to person, place, and time. PSYCHIATRIC: Normal mood and affect. Normal behavior. Normal judgment and thought content. CARDIOVASCULAR:Not Examined RESPIRATORY: Not Examined BREASTS: Not Examined ABDOMEN: Soft, non distended; Non tender.  No Organomegaly. PELVIC: Not indicated  MUSCULOSKELETAL: Normal range of motion. No tenderness.  No cyanosis, clubbing, or edema.     Assessment:   1. Other fatigue (Primary) - Vitamin D  (25 hydroxy) - CBC - FSH/LH - Estradiol  - Polysomnography 4 or more parameters; Future  2. Perimenopausal - Vitamin D  (25 hydroxy) - CBC - FSH/LH - Estradiol   3. Vaginal dryness - Vitamin D  (25 hydroxy) - CBC - FSH/LH - Estradiol   4. Other insomnia - Vitamin D  (25 hydroxy) - CBC - FSH/LH - Estradiol  - Polysomnography 4 or more parameters; Future  5. Sleep talking - Polysomnography 4 or more parameters; Future     Plan:   Discussed  management options. Recommend reaching out to insurance to identify what actions need to be taken to have Addyi  covered. Discussed use of Wellbutrin  300 mg daily to help with mood changes as it has lower side effects for libido, and can suppress her appetite. Discussed use of vaginal estrogen for dryness. Estradiol  0.01% cream ordered. Sleep study ordered for sleep talking and insomnia, fatigue. Discussed use of medication for insomnia if sleep study negative. Labs : CBC, FSH/LH, estradiol , and vitamin D  ordered. Will follow up with results.   Face to face time 20 minutes reviewing history, symptoms , and discussing treatment options.   Zelda Hummer, CNM        [1]  Current Outpatient Medications on File Prior to Visit  Medication Sig Dispense Refill   albuterol  (VENTOLIN  HFA) 108 (90  Base) MCG/ACT inhaler Inhale 1-2 puffs into the lungs every 6 (six) hours as needed. 8.5 g 0   brompheniramine-pseudoephedrine-DM 30-2-10 MG/5ML syrup Take 5 mLs by mouth 4 (four) times daily as needed. (Patient not taking: Reported on 06/09/2024) 120 mL 0   Cholecalciferol  1.25 MG (50000 UT) capsule Take 1 capsule (50,000 Units total) by mouth once a week. 4 capsule 3   ipratropium (ATROVENT ) 0.03 % nasal spray Place 2 sprays into both nostrils every 12 (twelve) hours. 30 mL 0   promethazine -dextromethorphan (PROMETHAZINE -DM) 6.25-15 MG/5ML syrup Take 5 mLs by mouth 4 (four) times daily as needed for cough. 118 mL 0   propranolol  (INDERAL ) 10 MG tablet Take 1 tablet (10 mg total) by mouth 2 (two) times daily. Take an addition tablet as needed for persistent high heart rate. 180 tablet 3   No current facility-administered medications on file prior to visit.  [2]  Allergies Allergen Reactions   Amoxicillin  Rash and Other (See Comments)    GI upset

## 2024-07-20 LAB — VITAMIN D 25 HYDROXY (VIT D DEFICIENCY, FRACTURES): Vit D, 25-Hydroxy: 34 ng/mL (ref 30.0–100.0)

## 2024-07-20 LAB — CBC
Hematocrit: 45.3 % (ref 34.0–46.6)
Hemoglobin: 14.8 g/dL (ref 11.1–15.9)
MCH: 31 pg (ref 26.6–33.0)
MCHC: 32.7 g/dL (ref 31.5–35.7)
MCV: 95 fL (ref 79–97)
Platelets: 286 x10E3/uL (ref 150–450)
RBC: 4.78 x10E6/uL (ref 3.77–5.28)
RDW: 13.2 % (ref 11.7–15.4)
WBC: 4.5 x10E3/uL (ref 3.4–10.8)

## 2024-07-20 LAB — FSH/LH
FSH: 6.3 m[IU]/mL
LH: 6.6 m[IU]/mL

## 2024-07-20 LAB — ESTRADIOL: Estradiol: 133 pg/mL

## 2024-07-21 ENCOUNTER — Encounter: Payer: Self-pay | Admitting: Certified Nurse Midwife

## 2024-07-27 ENCOUNTER — Other Ambulatory Visit: Payer: Self-pay

## 2024-07-27 ENCOUNTER — Other Ambulatory Visit: Payer: Self-pay | Admitting: Certified Nurse Midwife

## 2024-07-27 MED ORDER — WEGOVY 1.5 MG PO TABS
1.5000 mg | ORAL_TABLET | Freq: Every day | ORAL | 0 refills | Status: AC
  Filled 2024-07-27: qty 30, 30d supply, fill #0

## 2024-07-28 ENCOUNTER — Other Ambulatory Visit: Payer: Self-pay

## 2024-08-04 ENCOUNTER — Other Ambulatory Visit: Payer: Self-pay

## 2024-08-04 ENCOUNTER — Encounter: Payer: Self-pay | Admitting: Emergency Medicine

## 2024-08-04 ENCOUNTER — Emergency Department
Admission: EM | Admit: 2024-08-04 | Discharge: 2024-08-04 | Disposition: A | Discharge status: Home / Self Care | Source: Home / Self Care

## 2024-08-04 ENCOUNTER — Emergency Department

## 2024-08-04 ENCOUNTER — Telehealth: Admitting: Student

## 2024-08-04 DIAGNOSIS — R112 Nausea with vomiting, unspecified: Secondary | ICD-10-CM

## 2024-08-04 DIAGNOSIS — R051 Acute cough: Secondary | ICD-10-CM

## 2024-08-04 LAB — COMPREHENSIVE METABOLIC PANEL WITH GFR
ALT: 7 U/L (ref 0–44)
AST: 14 U/L — ABNORMAL LOW (ref 15–41)
Albumin: 3.5 g/dL (ref 3.5–5.0)
Alkaline Phosphatase: 49 U/L (ref 38–126)
Anion gap: 13 (ref 5–15)
BUN: 12 mg/dL (ref 6–20)
CO2: 18 mmol/L — ABNORMAL LOW (ref 22–32)
Calcium: 8.6 mg/dL — ABNORMAL LOW (ref 8.9–10.3)
Chloride: 104 mmol/L (ref 98–111)
Creatinine, Ser: 0.86 mg/dL (ref 0.44–1.00)
GFR, Estimated: >60 mL/min
Glucose, Bld: 105 mg/dL — ABNORMAL HIGH (ref 70–99)
Potassium: 4.1 mmol/L (ref 3.5–5.1)
Sodium: 135 mmol/L (ref 135–145)
Total Bilirubin: 0.6 mg/dL (ref 0.0–1.2)
Total Protein: 5.6 g/dL — ABNORMAL LOW (ref 6.5–8.1)

## 2024-08-04 LAB — URINALYSIS, ROUTINE W REFLEX MICROSCOPIC
Bacteria, UA: NONE SEEN
Bilirubin Urine: NEGATIVE
Glucose, UA: NEGATIVE mg/dL
Hgb urine dipstick: NEGATIVE
Ketones, ur: 80 mg/dL — AB
Leukocytes,Ua: NEGATIVE
Nitrite: NEGATIVE
Protein, ur: 30 mg/dL — AB
Specific Gravity, Urine: 1.02 (ref 1.005–1.030)
pH: 9 — ABNORMAL HIGH (ref 5.0–8.0)

## 2024-08-04 LAB — RESP PANEL BY RT-PCR (RSV, FLU A&B, COVID)  RVPGX2
Influenza A by PCR: NEGATIVE
Influenza B by PCR: NEGATIVE
Resp Syncytial Virus by PCR: NEGATIVE
SARS Coronavirus 2 by RT PCR: NEGATIVE

## 2024-08-04 LAB — CBC WITH DIFFERENTIAL/PLATELET
Abs Immature Granulocytes: 0.04 10*3/uL (ref 0.00–0.07)
Basophils Absolute: 0 10*3/uL (ref 0.0–0.1)
Basophils Relative: 0 %
Eosinophils Absolute: 0 10*3/uL (ref 0.0–0.5)
Eosinophils Relative: 0 %
HCT: 44.4 % (ref 36.0–46.0)
Hemoglobin: 15.5 g/dL — ABNORMAL HIGH (ref 12.0–15.0)
Immature Granulocytes: 1 %
Lymphocytes Relative: 8 %
Lymphs Abs: 0.6 10*3/uL — ABNORMAL LOW (ref 0.7–4.0)
MCH: 30.9 pg (ref 26.0–34.0)
MCHC: 34.9 g/dL (ref 30.0–36.0)
MCV: 88.6 fL (ref 80.0–100.0)
Monocytes Absolute: 0.3 10*3/uL (ref 0.1–1.0)
Monocytes Relative: 5 %
Neutro Abs: 6.2 10*3/uL (ref 1.7–7.7)
Neutrophils Relative %: 86 %
Platelets: 257 10*3/uL (ref 150–400)
RBC: 5.01 MIL/uL (ref 3.87–5.11)
RDW: 13 % (ref 11.5–15.5)
WBC: 7.2 10*3/uL (ref 4.0–10.5)
nRBC: 0 % (ref 0.0–0.2)

## 2024-08-04 LAB — PREGNANCY, URINE: Preg Test, Ur: NEGATIVE

## 2024-08-04 LAB — LIPASE, BLOOD: Lipase: 22 U/L (ref 11–51)

## 2024-08-04 MED ORDER — KETOROLAC TROMETHAMINE 15 MG/ML IJ SOLN
15.0000 mg | Freq: Once | INTRAMUSCULAR | Status: AC
  Administered 2024-08-04: 15 mg via INTRAVENOUS
  Filled 2024-08-04: qty 1

## 2024-08-04 MED ORDER — ONDANSETRON HCL 4 MG/2ML IJ SOLN
4.0000 mg | Freq: Once | INTRAMUSCULAR | Status: AC
  Administered 2024-08-04: 4 mg via INTRAVENOUS
  Filled 2024-08-04: qty 2

## 2024-08-04 MED ORDER — SODIUM CHLORIDE 0.9 % IV BOLUS
1000.0000 mL | Freq: Once | INTRAVENOUS | Status: AC
  Administered 2024-08-04: 1000 mL via INTRAVENOUS

## 2024-08-04 MED ORDER — ONDANSETRON 4 MG PO TBDP
4.0000 mg | ORAL_TABLET | Freq: Three times a day (TID) | ORAL | 0 refills | Status: AC | PRN
  Filled 2024-08-04: qty 10, 4d supply, fill #0

## 2024-08-04 NOTE — ED Triage Notes (Signed)
 Pt to triage via w/c with no distress noted; st nonprod cough for several days; since 7pm having N/V and generalized abd pain

## 2024-08-04 NOTE — ED Notes (Signed)
 See triage note  Presents with family   Having body aches with and chills  Developed n/v yesterday   Afebrile on arrival

## 2024-08-04 NOTE — ED Provider Notes (Signed)
 "  [facility] Provider Note    Event Date/Time   First MD Initiated Contact with Patient 08/04/24 0715     (approximate)   History   Abdominal Pain   HPI  Laura [name] is a 39 y.o. female with a past medical history of obesity, fibroids, depression who presents today for evaluation of cramping abdominal pain, cough, body aches, vomiting since last night.  She reports that her husband is sick with the same symptoms.  She has not had any fever.  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  She reports that she has gotten a flu shot this year.  No chest pain or shortness of breath.  No blood in her emesis.  Patient Active Problem List   Diagnosis Date Noted   Perimenopause 07/19/2024   Mood change 07/19/2024   Vaginal dryness 07/19/2024   Insomnia 12/29/2019   Stress incontinence 12/29/2019   Nicotine dependence, cigarettes, uncomplicated 08/18/2018   Low libido 10/28/2016   Obesity 10/28/2016   Fibroids 12/16/2015   History of cesarean section 11/06/2015   Post traumatic stress disorder 03/13/2015   Depression 03/13/2015          Physical Exam   Triage Vital Signs: ED Triage Vitals  Encounter Vitals Group     BP 08/04/24 0640 114/87     Girls Systolic BP Percentile --      Girls Diastolic BP Percentile --      Boys Systolic BP Percentile --      Boys Diastolic BP Percentile --      Pulse Rate 08/04/24 0640 (!) 106     Resp 08/04/24 0640 20     Temp 08/04/24 0640 98.3 F (36.8 C)     Temp src --      SpO2 08/04/24 0640 100 %     Weight 08/04/24 0638 209 lb (94.8 kg)     Height 08/04/24 0638 5' 2 (1.575 m)     Head Circumference --      Peak Flow --      Pain Score 08/04/24 0638 8     Pain Loc --      Pain Education --      Exclude from Growth Chart --     Most recent vital signs: Vitals:   08/04/24 0640 08/04/24 0958  BP: 114/87 120/80  Pulse: (!) 106 88  Resp:  20 18  Temp: 98.3 F (36.8 C)   SpO2: 100% 100%    Physical Exam Vitals and nursing note reviewed.  Constitutional:      General: Awake and alert. No acute distress.    Appearance: Normal appearance. The patient is normal weight.  HENT:     Head: Normocephalic and atraumatic.     Mouth: Mucous membranes are moist.  Eyes:     General: PERRL. Normal EOMs        Right eye: No discharge.        Left eye: No discharge.     Conjunctiva/sclera: Conjunctivae normal.  Cardiovascular:     Rate and Rhythm: Normal rate and regular rhythm.     Pulses: Normal pulses.  Pulmonary:     Effort: Pulmonary effort is normal. No respiratory distress.     Breath sounds: Normal breath sounds.  Abdominal:     Abdomen is soft. There is no abdominal tenderness. No rebound or guarding. No distention. Musculoskeletal:  General: No swelling. Normal range of motion.     Cervical back: Normal range of motion and neck supple.  Skin:    General: Skin is warm and dry.     Capillary Refill: Capillary refill takes less than 2 seconds.     Findings: No rash.  Neurological:     Mental Status: The patient is awake and alert.      ED Results / Procedures / Treatments   Labs (all labs ordered are listed, but only abnormal results are displayed) Labs Reviewed  CBC WITH DIFFERENTIAL/PLATELET - Abnormal; Notable for the following components:      Result Value   Hemoglobin 15.5 (*)    Lymphs Abs 0.6 (*)    All other components within normal limits  COMPREHENSIVE METABOLIC PANEL WITH GFR - Abnormal; Notable for the following components:   CO2 18 (*)    Glucose, Bld 105 (*)    Calcium 8.6 (*)    Total Protein 5.6 (*)    AST 14 (*)    All other components within normal limits  URINALYSIS, ROUTINE W REFLEX MICROSCOPIC - Abnormal; Notable for the following components:   Color, Urine YELLOW (*)    APPearance CLEAR (*)    pH 9.0 (*)    Ketones, ur 80 (*)    Protein, ur 30 (*)    All other components  within normal limits  RESP PANEL BY RT-PCR (RSV, FLU A&B, COVID)  RVPGX2  LIPASE, BLOOD  PREGNANCY, URINE     EKG     RADIOLOGY     PROCEDURES:  Critical Care performed:   Procedures   MEDICATIONS ORDERED IN ED: Medications  sodium chloride  0.9 % bolus 1,000 mL (0 mLs Intravenous Stopped 08/04/24 0957)  ondansetron  (ZOFRAN ) injection 4 mg (4 mg Intravenous Given 08/04/24 0755)  ketorolac  (TORADOL ) 15 MG/ML injection 15 mg (15 mg Intravenous Given 08/04/24 0819)     IMPRESSION / MDM / ASSESSMENT AND PLAN / ED COURSE  I reviewed the triage vital signs and the nursing notes.   Differential diagnosis includes, but is not limited to, influenza, gastroenteritis, electrolyte disarray, dehydration, urinary tract infection.  Patient is awake and alert, tachycardic to 106 but normotensive and afebrile upon arrival.  She is nontoxic in appearance.  Her abdomen is soft and nontender throughout.  Labs obtained are overall reassuring.  Her COVID/flu/RSV test is negative.  She was hydrated with 1 L of normal saline, and also treated with Zofran  and Toradol  with significant improvement of her symptoms.  She was able to tolerate p.o.  Upon reevaluation she reports that she feels ready for discharge.  We discussed symptomatic management and return precautions.  She was given a work note per request.  She was given a prescription for Zofran  but instructed not to take more than prescribed.  Patient understands and agrees with plan.  She was discharged in stable condition.  Patient's presentation is most consistent with acute complicated illness / injury requiring diagnostic workup.    FINAL CLINICAL IMPRESSION(S) / ED DIAGNOSES   Final diagnoses:  Nausea vomiting and diarrhea     Rx / DC Orders   ED Discharge Orders          Ordered    ondansetron  (ZOFRAN -ODT) 4 MG disintegrating tablet  Every 8 hours PRN        08/04/24 0920             Note:  This document was prepared  using Dragon voice recognition software  and may include unintentional dictation errors.   Vernal Hritz E, PA-C 08/04/24 1416    Mulvihill, Caitlin M, MD 08/04/24 1520  "

## 2024-08-04 NOTE — Progress Notes (Signed)
" °  Because of your symptoms, I feel your condition warrants further evaluation and I recommend that you be seen in a face-to-face visit. I am concerned that you have pneumonia or another lung infection. You most likely require a chest x-ray.   NOTE: There will be NO CHARGE for this E-Visit   If you are having a true medical emergency, please call 911.     For an urgent face to face visit, [facility] has multiple urgent care centers for your convenience.  Click the link below for the full list of locations and hours, walk-in wait times, appointment scheduling options and driving directions:  Urgent Care - [place], [place], [place], [place], [place], [place]  [place]     Your MyChart E-visit questionnaire answers were reviewed by a board certified advanced clinical practitioner to complete your personal care plan based on your specific symptoms.    Thank you for using e-Visits.    "

## 2024-08-04 NOTE — Discharge Instructions (Signed)
 Your labs and chest x-ray are overall reassuring.  You may continue to take Tylenol /ibuprofen  per package instructions to help with your symptoms.  Continue to have bland foods and take small sips of liquids. Please return for any new, worsening, or changing symptoms or other concerns.  It was a pleasure caring for you today.
